# Patient Record
Sex: Male | Born: 1970 | Race: Black or African American | Hispanic: No | Marital: Married | Smoking: Never smoker
Health system: Southern US, Community
[De-identification: ages and names within clinical notes are randomized; demographics above are authoritative.]

## PROBLEM LIST (undated history)

## (undated) ENCOUNTER — Ambulatory Visit

## (undated) DIAGNOSIS — G360 Neuromyelitis optica [Devic]: Secondary | ICD-10-CM

## (undated) DIAGNOSIS — E119 Type 2 diabetes mellitus without complications: Secondary | ICD-10-CM

## (undated) DIAGNOSIS — F419 Anxiety disorder, unspecified: Secondary | ICD-10-CM

## (undated) DIAGNOSIS — Z87442 Personal history of urinary calculi: Secondary | ICD-10-CM

## (undated) HISTORY — DX: Anxiety disorder, unspecified: F41.9

---
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
## 2011-03-28 DIAGNOSIS — N319 Neuromuscular dysfunction of bladder, unspecified: Secondary | ICD-10-CM | POA: Insufficient documentation

## 2012-01-29 DIAGNOSIS — G8220 Paraplegia, unspecified: Secondary | ICD-10-CM

## 2012-01-29 HISTORY — DX: Paraplegia, unspecified: G82.20

## 2012-08-25 DIAGNOSIS — N3644 Muscular disorders of urethra: Secondary | ICD-10-CM | POA: Insufficient documentation

## 2013-05-20 DIAGNOSIS — G360 Neuromyelitis optica [Devic]: Secondary | ICD-10-CM | POA: Insufficient documentation

## 2013-05-20 DIAGNOSIS — M4716 Other spondylosis with myelopathy, lumbar region: Secondary | ICD-10-CM | POA: Insufficient documentation

## 2013-06-01 DIAGNOSIS — N529 Male erectile dysfunction, unspecified: Secondary | ICD-10-CM | POA: Insufficient documentation

## 2015-08-28 DIAGNOSIS — R600 Localized edema: Secondary | ICD-10-CM | POA: Insufficient documentation

## 2016-01-16 DIAGNOSIS — N179 Acute kidney failure, unspecified: Secondary | ICD-10-CM | POA: Insufficient documentation

## 2016-01-24 NOTE — Telephone Encounter (Signed)
follow up 2 weeks general urology   Received: 5 days ago   Message Contents   [name], Sharlette Dense, PA  Steve Robbins      ??      Albina Billet,   This patient needs follow up with general urology in 2 weeks for urosepsis right ureteral stone s/p right JJ stent 01/16/2016.     Thank you,   Heather      Please schedule follow up appointments

## 2016-02-02 ENCOUNTER — Encounter: Attending: Urology | Primary: Neurology

## 2018-01-14 ENCOUNTER — Other Ambulatory Visit: Payer: Self-pay

## 2018-01-14 ENCOUNTER — Inpatient Hospital Stay (HOSPITAL_COMMUNITY)
Admission: EM | Admit: 2018-01-14 | Discharge: 2018-01-18 | DRG: 661 | Disposition: A | Discharge status: Home / Self Care | Payer: Medicare Other | Attending: Urology | Admitting: Urology

## 2018-01-14 ENCOUNTER — Encounter (HOSPITAL_COMMUNITY): Payer: Self-pay | Admitting: Emergency Medicine

## 2018-01-14 DIAGNOSIS — B952 Enterococcus as the cause of diseases classified elsewhere: Secondary | ICD-10-CM | POA: Diagnosis present

## 2018-01-14 DIAGNOSIS — N390 Urinary tract infection, site not specified: Secondary | ICD-10-CM

## 2018-01-14 DIAGNOSIS — Z885 Allergy status to narcotic agent status: Secondary | ICD-10-CM

## 2018-01-14 DIAGNOSIS — E119 Type 2 diabetes mellitus without complications: Secondary | ICD-10-CM | POA: Diagnosis present

## 2018-01-14 DIAGNOSIS — N136 Pyonephrosis: Secondary | ICD-10-CM | POA: Diagnosis not present

## 2018-01-14 DIAGNOSIS — D899 Disorder involving the immune mechanism, unspecified: Secondary | ICD-10-CM

## 2018-01-14 DIAGNOSIS — N201 Calculus of ureter: Secondary | ICD-10-CM

## 2018-01-14 DIAGNOSIS — Z7984 Long term (current) use of oral hypoglycemic drugs: Secondary | ICD-10-CM

## 2018-01-14 DIAGNOSIS — Q6211 Congenital occlusion of ureteropelvic junction: Secondary | ICD-10-CM

## 2018-01-14 DIAGNOSIS — D849 Immunodeficiency, unspecified: Secondary | ICD-10-CM

## 2018-01-14 HISTORY — DX: Type 2 diabetes mellitus without complications: E11.9

## 2018-01-14 HISTORY — DX: Neuromyelitis optica (devic): G36.0

## 2018-01-14 NOTE — ED Triage Notes (Signed)
Patient states he had a high fever today and last night. Patient states he was shivering last night and tired.

## 2018-01-15 ENCOUNTER — Inpatient Hospital Stay (HOSPITAL_COMMUNITY): Payer: Medicare Other

## 2018-01-15 ENCOUNTER — Inpatient Hospital Stay (HOSPITAL_COMMUNITY): Payer: Medicare Other | Admitting: Anesthesiology

## 2018-01-15 ENCOUNTER — Emergency Department (HOSPITAL_COMMUNITY): Payer: Medicare Other

## 2018-01-15 ENCOUNTER — Encounter (HOSPITAL_COMMUNITY)
Admission: EM | Disposition: A | Discharge status: Home / Self Care | Payer: Self-pay | Source: Home / Self Care | Attending: Urology

## 2018-01-15 ENCOUNTER — Encounter (HOSPITAL_COMMUNITY): Payer: Self-pay | Admitting: Emergency Medicine

## 2018-01-15 DIAGNOSIS — E119 Type 2 diabetes mellitus without complications: Secondary | ICD-10-CM | POA: Diagnosis present

## 2018-01-15 DIAGNOSIS — Z885 Allergy status to narcotic agent status: Secondary | ICD-10-CM | POA: Diagnosis not present

## 2018-01-15 DIAGNOSIS — Z7984 Long term (current) use of oral hypoglycemic drugs: Secondary | ICD-10-CM | POA: Diagnosis not present

## 2018-01-15 DIAGNOSIS — N136 Pyonephrosis: Secondary | ICD-10-CM | POA: Diagnosis present

## 2018-01-15 DIAGNOSIS — B952 Enterococcus as the cause of diseases classified elsewhere: Secondary | ICD-10-CM | POA: Diagnosis present

## 2018-01-15 DIAGNOSIS — N201 Calculus of ureter: Secondary | ICD-10-CM | POA: Diagnosis present

## 2018-01-15 HISTORY — PX: CYSTOSCOPY WITH STENT PLACEMENT: SHX5790

## 2018-01-15 LAB — CBC WITH DIFFERENTIAL/PLATELET
Abs Immature Granulocytes: 0.03 10*3/uL (ref 0.00–0.07)
Basophils Absolute: 0 10*3/uL (ref 0.0–0.1)
Basophils Relative: 0 %
Eosinophils Absolute: 0.1 10*3/uL (ref 0.0–0.5)
Eosinophils Relative: 1 %
HCT: 41 % (ref 39.0–52.0)
Hemoglobin: 12.5 g/dL — ABNORMAL LOW (ref 13.0–17.0)
Immature Granulocytes: 1 %
Lymphocytes Relative: 19 %
Lymphs Abs: 1.3 10*3/uL (ref 0.7–4.0)
MCH: 25.1 pg — ABNORMAL LOW (ref 26.0–34.0)
MCHC: 30.5 g/dL (ref 30.0–36.0)
MCV: 82.2 fL (ref 80.0–100.0)
Monocytes Absolute: 1 10*3/uL (ref 0.1–1.0)
Monocytes Relative: 16 %
Neutro Abs: 4.2 10*3/uL (ref 1.7–7.7)
Neutrophils Relative %: 63 %
Platelets: 225 10*3/uL (ref 150–400)
RBC: 4.99 MIL/uL (ref 4.22–5.81)
RDW: 14.1 % (ref 11.5–15.5)
WBC: 6.6 10*3/uL (ref 4.0–10.5)
nRBC: 0 % (ref 0.0–0.2)

## 2018-01-15 LAB — URINALYSIS, ROUTINE W REFLEX MICROSCOPIC
Bilirubin Urine: NEGATIVE
Glucose, UA: NEGATIVE mg/dL
Ketones, ur: NEGATIVE mg/dL
Nitrite: NEGATIVE
PROTEIN: NEGATIVE mg/dL
Specific Gravity, Urine: 1.009 (ref 1.005–1.030)
pH: 5 (ref 5.0–8.0)

## 2018-01-15 LAB — GLUCOSE, CAPILLARY
Glucose-Capillary: 101 mg/dL — ABNORMAL HIGH (ref 70–99)
Glucose-Capillary: 102 mg/dL — ABNORMAL HIGH (ref 70–99)
Glucose-Capillary: 130 mg/dL — ABNORMAL HIGH (ref 70–99)

## 2018-01-15 LAB — CBC
HCT: 38.3 % — ABNORMAL LOW (ref 39.0–52.0)
Hemoglobin: 11.6 g/dL — ABNORMAL LOW (ref 13.0–17.0)
MCH: 24.9 pg — ABNORMAL LOW (ref 26.0–34.0)
MCHC: 30.3 g/dL (ref 30.0–36.0)
MCV: 82.4 fL (ref 80.0–100.0)
Platelets: 195 10*3/uL (ref 150–400)
RBC: 4.65 MIL/uL (ref 4.22–5.81)
RDW: 14.1 % (ref 11.5–15.5)
WBC: 6.7 10*3/uL (ref 4.0–10.5)
nRBC: 0 % (ref 0.0–0.2)

## 2018-01-15 LAB — COMPREHENSIVE METABOLIC PANEL
ALT: 13 U/L (ref 0–44)
ANION GAP: 10 (ref 5–15)
AST: 13 U/L — ABNORMAL LOW (ref 15–41)
Albumin: 4.3 g/dL (ref 3.5–5.0)
Alkaline Phosphatase: 37 U/L — ABNORMAL LOW (ref 38–126)
BUN: 13 mg/dL (ref 6–20)
CO2: 24 mmol/L (ref 22–32)
Calcium: 9.1 mg/dL (ref 8.9–10.3)
Chloride: 101 mmol/L (ref 98–111)
Creatinine, Ser: 1.17 mg/dL (ref 0.61–1.24)
GFR calc Af Amer: >60 mL/min (ref >60)
GFR calc non Af Amer: >60 mL/min (ref >60)
Glucose, Bld: 117 mg/dL — ABNORMAL HIGH (ref 70–99)
Potassium: 3.6 mmol/L (ref 3.5–5.1)
Sodium: 135 mmol/L (ref 135–145)
TOTAL PROTEIN: 7.5 g/dL (ref 6.5–8.1)
Total Bilirubin: 1.3 mg/dL — ABNORMAL HIGH (ref 0.3–1.2)

## 2018-01-15 LAB — CBG MONITORING, ED: Glucose-Capillary: 100 mg/dL — ABNORMAL HIGH (ref 70–99)

## 2018-01-15 LAB — CREATININE, SERUM
CREATININE: 1.09 mg/dL (ref 0.61–1.24)
GFR calc Af Amer: >60 mL/min (ref >60)
GFR calc non Af Amer: >60 mL/min (ref >60)

## 2018-01-15 LAB — HIV ANTIBODY (ROUTINE TESTING W REFLEX): HIV SCREEN 4TH GENERATION: NONREACTIVE

## 2018-01-15 LAB — INFLUENZA PANEL BY PCR (TYPE A & B)
INFLBPCR: NEGATIVE
Influenza A By PCR: NEGATIVE

## 2018-01-15 SURGERY — CYSTOSCOPY, WITH STENT INSERTION
Anesthesia: General | Site: Ureter | Laterality: Left

## 2018-01-15 MED ORDER — SODIUM CHLORIDE 0.9 % IV SOLN
1.0000 g | INTRAVENOUS | Status: DC
  Administered 2018-01-16 – 2018-01-17 (×2): 1 g via INTRAVENOUS
  Filled 2018-01-15 (×2): qty 1

## 2018-01-15 MED ORDER — INSULIN ASPART 100 UNIT/ML ~~LOC~~ SOLN
4.0000 [IU] | Freq: Three times a day (TID) | SUBCUTANEOUS | Status: DC
  Administered 2018-01-16 – 2018-01-18 (×5): 4 [IU] via SUBCUTANEOUS

## 2018-01-15 MED ORDER — ONDANSETRON HCL 4 MG/2ML IJ SOLN
INTRAMUSCULAR | Status: DC | PRN
  Administered 2018-01-15: 4 mg via INTRAVENOUS

## 2018-01-15 MED ORDER — LIDOCAINE 2% (20 MG/ML) 5 ML SYRINGE
INTRAMUSCULAR | Status: DC | PRN
  Administered 2018-01-15: 100 mg via INTRAVENOUS

## 2018-01-15 MED ORDER — INSULIN ASPART 100 UNIT/ML ~~LOC~~ SOLN
0.0000 [IU] | Freq: Three times a day (TID) | SUBCUTANEOUS | Status: DC
  Administered 2018-01-15 – 2018-01-17 (×5): 2 [IU] via SUBCUTANEOUS

## 2018-01-15 MED ORDER — ROCURONIUM BROMIDE 10 MG/ML (PF) SYRINGE
PREFILLED_SYRINGE | INTRAVENOUS | Status: DC | PRN
  Administered 2018-01-15: 40 mg via INTRAVENOUS

## 2018-01-15 MED ORDER — SODIUM CHLORIDE 0.9 % IV SOLN
1.0000 g | Freq: Once | INTRAVENOUS | Status: AC
  Administered 2018-01-15: 1 g via INTRAVENOUS
  Filled 2018-01-15: qty 1

## 2018-01-15 MED ORDER — PROPOFOL 10 MG/ML IV BOLUS
INTRAVENOUS | Status: AC
  Filled 2018-01-15: qty 20

## 2018-01-15 MED ORDER — FENTANYL CITRATE (PF) 100 MCG/2ML IJ SOLN
INTRAMUSCULAR | Status: AC
  Filled 2018-01-15: qty 2

## 2018-01-15 MED ORDER — ONDANSETRON HCL 4 MG/2ML IJ SOLN
4.0000 mg | INTRAMUSCULAR | Status: DC | PRN
  Administered 2018-01-15 – 2018-01-17 (×2): 4 mg via INTRAVENOUS
  Filled 2018-01-15 (×2): qty 2

## 2018-01-15 MED ORDER — FENTANYL CITRATE (PF) 100 MCG/2ML IJ SOLN: 25.0000 ug | INTRAMUSCULAR | Status: DC | PRN

## 2018-01-15 MED ORDER — IBUPROFEN 200 MG PO TABS
200.0000 mg | ORAL_TABLET | Freq: Four times a day (QID) | ORAL | Status: AC | PRN
  Administered 2018-01-15 – 2018-01-16 (×2): 200 mg via ORAL
  Filled 2018-01-15 (×2): qty 1

## 2018-01-15 MED ORDER — SODIUM CHLORIDE 0.45 % IV SOLN
INTRAVENOUS | Status: DC
  Administered 2018-01-15 – 2018-01-16 (×2): via INTRAVENOUS

## 2018-01-15 MED ORDER — SUGAMMADEX SODIUM 200 MG/2ML IV SOLN
INTRAVENOUS | Status: DC | PRN
  Administered 2018-01-15: 500 mg via INTRAVENOUS

## 2018-01-15 MED ORDER — SODIUM CHLORIDE 0.9 % IV BOLUS
1000.0000 mL | Freq: Once | INTRAVENOUS | Status: AC
  Administered 2018-01-15: 1000 mL via INTRAVENOUS

## 2018-01-15 MED ORDER — OXYBUTYNIN CHLORIDE 5 MG PO TABS
5.0000 mg | ORAL_TABLET | Freq: Three times a day (TID) | ORAL | Status: DC | PRN
  Filled 2018-01-15: qty 1

## 2018-01-15 MED ORDER — ROCURONIUM BROMIDE 10 MG/ML (PF) SYRINGE
PREFILLED_SYRINGE | INTRAVENOUS | Status: AC
  Filled 2018-01-15: qty 10

## 2018-01-15 MED ORDER — SUGAMMADEX SODIUM 500 MG/5ML IV SOLN
INTRAVENOUS | Status: AC
  Filled 2018-01-15: qty 5

## 2018-01-15 MED ORDER — ENOXAPARIN SODIUM 40 MG/0.4ML ~~LOC~~ SOLN
40.0000 mg | Freq: Every day | SUBCUTANEOUS | Status: DC
  Administered 2018-01-16 – 2018-01-18 (×3): 40 mg via SUBCUTANEOUS
  Filled 2018-01-15 (×4): qty 0.4

## 2018-01-15 MED ORDER — PROMETHAZINE HCL 25 MG/ML IJ SOLN: 6.2500 mg | INTRAMUSCULAR | Status: DC | PRN

## 2018-01-15 MED ORDER — MYCOPHENOLATE MOFETIL 250 MG PO CAPS
1000.0000 mg | ORAL_CAPSULE | Freq: Two times a day (BID) | ORAL | Status: DC
  Administered 2018-01-15 – 2018-01-18 (×7): 1000 mg via ORAL
  Filled 2018-01-15 (×8): qty 4

## 2018-01-15 MED ORDER — ONDANSETRON HCL 4 MG/2ML IJ SOLN
INTRAMUSCULAR | Status: AC
  Filled 2018-01-15: qty 2

## 2018-01-15 MED ORDER — MIDAZOLAM HCL 2 MG/2ML IJ SOLN
INTRAMUSCULAR | Status: DC | PRN
  Administered 2018-01-15: 2 mg via INTRAVENOUS

## 2018-01-15 MED ORDER — DIPHENHYDRAMINE HCL 50 MG/ML IJ SOLN
INTRAMUSCULAR | Status: DC | PRN
  Administered 2018-01-15: 12.5 mg via INTRAVENOUS

## 2018-01-15 MED ORDER — FENTANYL CITRATE (PF) 100 MCG/2ML IJ SOLN
INTRAMUSCULAR | Status: DC | PRN
  Administered 2018-01-15: 100 ug via INTRAVENOUS

## 2018-01-15 MED ORDER — ZOLPIDEM TARTRATE 5 MG PO TABS
5.0000 mg | ORAL_TABLET | Freq: Every evening | ORAL | Status: DC | PRN
  Filled 2018-01-15: qty 1

## 2018-01-15 MED ORDER — LACTATED RINGERS IV SOLN
INTRAVENOUS | Status: DC | PRN
  Administered 2018-01-15: 06:00:00 via INTRAVENOUS

## 2018-01-15 MED ORDER — SODIUM CHLORIDE 0.9 % IV SOLN
INTRAVENOUS | Status: AC
  Filled 2018-01-15: qty 20

## 2018-01-15 MED ORDER — STERILE WATER FOR IRRIGATION IR SOLN
Status: DC | PRN
  Administered 2018-01-15: 3000 mL

## 2018-01-15 MED ORDER — DEXTROSE 5 % IV SOLN
INTRAVENOUS | Status: DC | PRN
  Administered 2018-01-15: 2 g via INTRAVENOUS

## 2018-01-15 MED ORDER — ACETAMINOPHEN 325 MG PO TABS
650.0000 mg | ORAL_TABLET | ORAL | Status: DC | PRN
  Administered 2018-01-15 – 2018-01-18 (×8): 650 mg via ORAL
  Filled 2018-01-15 (×8): qty 2

## 2018-01-15 MED ORDER — LIDOCAINE 2% (20 MG/ML) 5 ML SYRINGE
INTRAMUSCULAR | Status: AC
  Filled 2018-01-15: qty 5

## 2018-01-15 MED ORDER — MIDAZOLAM HCL 2 MG/2ML IJ SOLN
INTRAMUSCULAR | Status: AC
  Filled 2018-01-15: qty 2

## 2018-01-15 MED ORDER — SUCCINYLCHOLINE CHLORIDE 200 MG/10ML IV SOSY
PREFILLED_SYRINGE | INTRAVENOUS | Status: AC
  Filled 2018-01-15: qty 10

## 2018-01-15 MED ORDER — SENNA 8.6 MG PO TABS
1.0000 | ORAL_TABLET | Freq: Two times a day (BID) | ORAL | Status: DC
  Administered 2018-01-15 – 2018-01-18 (×3): 8.6 mg via ORAL
  Filled 2018-01-15 (×7): qty 1

## 2018-01-15 MED ORDER — DIPHENHYDRAMINE HCL 50 MG/ML IJ SOLN
INTRAMUSCULAR | Status: AC
  Filled 2018-01-15: qty 1

## 2018-01-15 MED ORDER — PROPOFOL 10 MG/ML IV BOLUS
INTRAVENOUS | Status: DC | PRN
  Administered 2018-01-15: 200 mg via INTRAVENOUS

## 2018-01-15 SURGICAL SUPPLY — 15 items
BAG URO CATCHER STRL LF (MISCELLANEOUS) ×3 IMPLANT
CATH INTERMIT  6FR 70CM (CATHETERS) IMPLANT
CLOTH BEACON ORANGE TIMEOUT ST (SAFETY) ×3 IMPLANT
COVER WAND RF STERILE (DRAPES) IMPLANT
GLOVE BIOGEL M 8.0 STRL (GLOVE) ×3 IMPLANT
GOWN STRL REUS W/ TWL XL LVL3 (GOWN DISPOSABLE) ×1 IMPLANT
GOWN STRL REUS W/TWL LRG LVL3 (GOWN DISPOSABLE) ×3 IMPLANT
GOWN STRL REUS W/TWL XL LVL3 (GOWN DISPOSABLE) ×2
GUIDEWIRE ANG ZIPWIRE 038X150 (WIRE) IMPLANT
GUIDEWIRE STR DUAL SENSOR (WIRE) ×3 IMPLANT
MANIFOLD NEPTUNE II (INSTRUMENTS) ×3 IMPLANT
PACK CYSTO (CUSTOM PROCEDURE TRAY) ×3 IMPLANT
STENT URET 6FRX26 CONTOUR (STENTS) ×3 IMPLANT
TUBING CONNECTING 10 (TUBING) ×2 IMPLANT
TUBING CONNECTING 10' (TUBING) ×1

## 2018-01-15 NOTE — ED Provider Notes (Signed)
[facility] DEPT Provider Note   CSN: [id number] Arrival date & time: 01/14/18  2130  Time seen 12:06 AM   History   Chief Complaint Chief Complaint  Patient presents with  . Fatigue  . Fever    HPI Victor Christensen is a 47 y.o. male.    HPI patient reports the evening of December 17 he had chills at night.  On the day of December 18 he felt weak and tired without energy all day.  He states about 6:30 PM tonight he had a fever of 102.4 but did not have chills.  He denies cough, sore throat, he states he had some mild rhinorrhea that is white, he denies sneezing, nausea, vomiting, or diarrhea.  He denies being around anybody else who is ill.  He denies dysuria but states he started having frequency yesterday without hematuria.  He states he ate less today although he did drink.  Patient states he has felt this way before when he had kidney stones or urinary tract infections.  He states he has had kidney stones 3 times the first 2 he had pain but the last episode 2 years ago he did not have any pain.  He did have cystoscopy done to remove the stones.  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  He is in a wheelchair.  He states he takes CellCept.  PCP  OOT   Past Medical History:  Diagnosis Date  . Diabetes mellitus without complication ([place])   . Neuromyelitis optica [facility])     Patient Active Problem List   Diagnosis Date Noted  . Ureteral calculus, left 01/15/2018    History reviewed. No pertinent surgical history.      Home Medications    Prior to Admission medications   Medication Sig Start Date End Date Taking? Authorizing Provider  Lancets [facility] ULTRASOFT) lancets 4 each by Other route 4 (four) times daily -  with meals and at bedtime. 09/16/15  Yes [provider]  metFORMIN (GLUCOPHAGE-XR) 500 MG 24 hr tablet Take 500 mg by mouth 2 (two) times daily.   Yes [provider]  mycophenolate (CELLCEPT) 500 MG tablet Take 1,000 mg by mouth 2 (two) times daily.   Yes [provider]  Gabapentin prn  Family History History reviewed. No pertinent family history.  Social History Social History   Tobacco Use  . Smoking status: Never Smoker  . Smokeless tobacco: Never Used  Substance Use Topics  . Alcohol use: Not Currently  . Drug use: Not Currently  wheelchair bound Patient is moving to [place]  Allergies   Oxycodone-acetaminophen   Review of Systems Review of Systems  All other systems reviewed and are negative.    Physical Exam Updated Vital Signs BP 95/68 (BP Location: Left Arm)   Pulse 97   Temp 98.1 F (36.7 C) (Oral)   Resp 15   Ht 5' 11"  (1.803 m)   Wt 106.6 kg   SpO2 96%   BMI 32.78 kg/m   Vital signs normal except for mild hypotension   Physical Exam Vitals signs and nursing note reviewed.  Constitutional:      General: He is not in acute distress.    Appearance: Normal appearance. He is well-developed. He is not ill-appearing or toxic-appearing.  HENT:     Head: Normocephalic and atraumatic.     Right Ear: External ear normal.     Left Ear: External  ear normal.     Nose: Nose normal. No mucosal edema or rhinorrhea.     Mouth/Throat:     Mouth: Mucous membranes are dry.     Dentition: No dental abscesses.     Pharynx: No uvula swelling.  Eyes:     Conjunctiva/sclera: Conjunctivae normal.     Pupils: Pupils are equal, round, and reactive to light.  Neck:     Musculoskeletal: Full passive range of motion without pain, normal range of motion and neck supple.  Cardiovascular:     Rate and Rhythm: Regular rhythm. Tachycardia present.     Heart sounds: Normal heart sounds. No murmur. No friction rub. No gallop.   Pulmonary:     Effort: Pulmonary effort is normal. No respiratory distress.     Breath sounds: Normal breath sounds. No wheezing, rhonchi or rales.  Chest:     Chest wall: No tenderness or  crepitus.  Abdominal:     General: Bowel sounds are normal. There is no distension.     Palpations: Abdomen is soft.     Tenderness: There is no abdominal tenderness. There is no guarding or rebound.  Musculoskeletal:     Comments: Moves upper extremities well.   Skin:    General: Skin is warm and dry.     Coloration: Skin is not pale.     Findings: No erythema or rash.  Neurological:     Mental Status: He is alert and oriented to person, place, and time.     Cranial Nerves: No cranial nerve deficit.  Psychiatric:        Mood and Affect: Mood normal. Mood is not anxious.        Speech: Speech normal.        Behavior: Behavior normal.        Thought Content: Thought content normal.        Judgment: Judgment normal.      ED Treatments / Results  Labs (all labs ordered are listed, but only abnormal results are displayed) Results for orders placed or performed during the hospital encounter of 01/14/18  Culture, blood (routine x 2)  Result Value Ref Range   Specimen Description      BLOOD RIGHT FOREARM Performed at [facility], [address], [place], [place] [address]    Special Requests      BOTTLES DRAWN AEROBIC AND ANAEROBIC Blood Culture results may not be optimal due to an excessive volume of blood received in culture bottles Performed at [facility] [address] [place], [place] [address]    Culture PENDING    Report Status PENDING   Comprehensive metabolic panel  Result Value Ref Range   Sodium 135 135 - 145 mmol/L   Potassium 3.6 3.5 - 5.1 mmol/L   Chloride 101 98 - 111 mmol/L   CO2 24 22 - 32 mmol/L   Glucose, Bld 117 (H) 70 - 99 mg/dL   BUN 13 6 - 20 mg/dL   Creatinine, Ser 1.17 0.61 - 1.24 mg/dL   Calcium 9.1 8.9 - 10.3 mg/dL   Total Protein 7.5 6.5 - 8.1 g/dL   Albumin 4.3 3.5 - 5.0 g/dL   AST 13 (L) 15 - 41 U/L   ALT 13 0 - 44 U/L   Alkaline Phosphatase 37 (L) 38 - 126 U/L   Total Bilirubin 1.3 (H) 0.3 - 1.2 mg/dL   GFR  calc non Af Amer >60 >60 mL/min   GFR calc Af  Amer >60 >60 mL/min   Anion gap 10 5 - 15  CBC with Differential  Result Value Ref Range   WBC 6.6 4.0 - 10.5 K/uL   RBC 4.99 4.22 - 5.81 MIL/uL   Hemoglobin 12.5 (L) 13.0 - 17.0 g/dL   HCT 41.0 39.0 - 52.0 %   MCV 82.2 80.0 - 100.0 fL   MCH 25.1 (L) 26.0 - 34.0 pg   MCHC 30.5 30.0 - 36.0 g/dL   RDW 14.1 11.5 - 15.5 %   Platelets 225 150 - 400 K/uL   nRBC 0.0 0.0 - 0.2 %   Neutrophils Relative % 63 %   Neutro Abs 4.2 1.7 - 7.7 K/uL   Lymphocytes Relative 19 %   Lymphs Abs 1.3 0.7 - 4.0 K/uL   Monocytes Relative 16 %   Monocytes Absolute 1.0 0.1 - 1.0 K/uL   Eosinophils Relative 1 %   Eosinophils Absolute 0.1 0.0 - 0.5 K/uL   Basophils Relative 0 %   Basophils Absolute 0.0 0.0 - 0.1 K/uL   Immature Granulocytes 1 %   Abs Immature Granulocytes 0.03 0.00 - 0.07 K/uL  Urinalysis, Routine w reflex microscopic  Result Value Ref Range   Color, Urine YELLOW YELLOW   APPearance CLEAR CLEAR   Specific Gravity, Urine 1.009 1.005 - 1.030   pH 5.0 5.0 - 8.0   Glucose, UA NEGATIVE NEGATIVE mg/dL   Hgb urine dipstick SMALL (A) NEGATIVE   Bilirubin Urine NEGATIVE NEGATIVE   Ketones, ur NEGATIVE NEGATIVE mg/dL   Protein, ur NEGATIVE NEGATIVE mg/dL   Nitrite NEGATIVE NEGATIVE   Leukocytes, UA SMALL (A) NEGATIVE   RBC / HPF 0-5 0 - 5 RBC/hpf   WBC, UA 21-50 0 - 5 WBC/hpf   Bacteria, UA RARE (A) NONE SEEN   Squamous Epithelial / LPF 0-5 0 - 5   Mucus PRESENT   Influenza panel by PCR (type A & B)  Result Value Ref Range   Influenza A By PCR NEGATIVE NEGATIVE   Influenza B By PCR NEGATIVE NEGATIVE  CBC  Result Value Ref Range   WBC 6.7 4.0 - 10.5 K/uL   RBC 4.65 4.22 - 5.81 MIL/uL   Hemoglobin 11.6 (L) 13.0 - 17.0 g/dL   HCT 38.3 (L) 39.0 - 52.0 %   MCV 82.4 80.0 - 100.0 fL   MCH 24.9 (L) 26.0 - 34.0 pg   MCHC 30.3 30.0 - 36.0 g/dL   RDW 14.1 11.5 - 15.5 %   Platelets 195 150 - 400 K/uL   nRBC 0.0 0.0 - 0.2 %  Creatinine, serum    Result Value Ref Range   Creatinine, Ser 1.09 0.61 - 1.24 mg/dL   GFR calc non Af Amer >60 >60 mL/min   GFR calc Af Amer >60 >60 mL/min  Glucose, capillary  Result Value Ref Range   Glucose-Capillary 101 (H) 70 - 99 mg/dL  CBG monitoring, ED  Result Value Ref Range   Glucose-Capillary 100 (H) 70 - 99 mg/dL   Comment 1 Notify RN    Laboratory interpretation all normal except probable UTI urine culture pending    EKG None  Radiology Ct Renal Stone Study  Result Date: 01/15/2018 CLINICAL DATA:  Flank pain and fever. Laterality not indicated. EXAM: CT ABDOMEN AND PELVIS WITHOUT CONTRAST TECHNIQUE: Multidetector CT imaging of the abdomen and pelvis was performed following the standard protocol without IV contrast. COMPARISON:  None. FINDINGS: Lower chest: Patchy peribronchial tree-in-bud nodular infiltrates in the lung bases probably  representing bronchopneumonia. No pleural effusions. Hepatobiliary: Layering density in the gallbladder may represent small stones or sludge. No obvious wall thickening or infiltration. No bile duct dilatation. No focal liver lesions demonstrated on unenhanced imaging. Pancreas: Unremarkable. No pancreatic ductal dilatation or surrounding inflammatory changes. Spleen: Normal in size without focal abnormality. Adrenals/Urinary Tract: No adrenal gland nodules. Bilateral intrarenal stones, largest in the right lower pole measuring 8 mm diameter. 6 mm stone in the proximal left ureter at the ureteropelvic junction with mild proximal hydronephrosis and stranding around the left kidney. Distal ureter is decompressed. Bladder is unremarkable. Stomach/Bowel: Stomach, small bowel, and colon are not abnormally distended. Stool fills the colon. No wall thickening or inflammatory changes appreciated. Appendix is normal. Vascular/Lymphatic: No significant vascular findings are present. Scattered retroperitoneal lymph nodes are not pathologically enlarged, likely reactive.  Reproductive: Prostate is unremarkable. Other: No free air or free fluid in the abdomen. Abdominal wall musculature appears intact. Musculoskeletal: Degenerative changes in the right hip. No destructive bone lesions. IMPRESSION: 1. 6 mm stone in the proximal left ureter with moderate proximal obstruction. 2. Bilateral nonobstructing intrarenal stones. 3. Patchy peribronchial tree-in-bud nodular infiltrates in the lung bases probably representing bronchopneumonia. 4. Layering density in the gallbladder may represent small stones or sludge. Electronically Signed   By: Lucienne Capers M.D.   On: 01/15/2018 01:09    Procedures Procedures (including critical care time)  Medications Ordered in ED Medications  sodium chloride 0.9 % bolus 1,000 mL (0 mLs Intravenous Stopped 01/15/18 0229)  sodium chloride 0.9 % bolus 1,000 mL (0 mLs Intravenous Stopped 01/15/18 0229)  aztreonam (AZACTAM) 1 g in sodium chloride 0.9 % 100 mL IVPB (0 g Intravenous Stopped 01/15/18 0300)     Initial Impression / Assessment and Plan / ED Course  I have reviewed the triage vital signs and the nursing notes.  Pertinent labs & imaging results that were available during my care of the patient were reviewed by me and considered in my medical decision making (see chart for details).     Patient is immune compromised because of his CellCept, he presents with a high fever and general malaise.  He has felt similarly in the past with UTIs.  IV was ordered with IV fluids, CT renal was done to look for kidney stones since he states the last time was 2 years ago and he was pain-free.  Laboratory testing was done including blood cultures.  Patient's temperature was rechecked at 12:22 AM and he remains afebrile.  02:50 AM there were no old urine cultures in his chart.  Patient was felt to be high risk UTI and he was given Azactam 1 g IV.  I talked to the patient about admission and he is agreeable.  Portable chest x-ray was done to  evaluate him for pneumonia.  3:27 AM Dr. Fuller Plan, hospitalist wants urology to be called for his admission.  03:35 AM Dr  Eulogio Ditch, Urology, will come see patient.   Final Clinical Impressions(s) / ED Diagnoses   Final diagnoses:  Left ureteral stone  Hydronephrosis with ureteropelvic junction (UPJ) obstruction  Immunocompromised patient [facility])  Urinary tract infection without hematuria, site unspecified    Plan admission  Rolland Porter, MD, Barbette Or, MD 01/15/18 [phone]

## 2018-01-15 NOTE — Op Note (Signed)
Preoperative diagnosis: Infected, obstructing left upper ureteral stone  Postoperative diagnosis: Same  Principal procedure: Cystoscopy, left retrograde ureteropyelogram, fluoroscopic interpretation, placement of 6 French by 26 cm contour double-J stent without tether  Surgeon: Loxley Cibrian  Anesthesia: General  Complications: None  Specimen: Urine from left renal pelvis for culture  Estimated blood loss: None  Indications: 47 year old male with neurologic illness, prior history of urolithiasis presenting earlier this morning with infected, obstructing left ureteral stone.  The patient does not have signs of sepsis.  He has had adequate evaluation of presents at this time for urgent stenting for management of this obstructing, infected left ureteral stone.  I discussed the procedure with the patient and his fiance.  He has been through a procedure like this in 2018.  He understands the procedure, risks, complications and desires to proceed.  Description of procedure: The patient was properly marked and identified prior to entering the operating room.  He was placed on the table, underwent general anesthetic and was placed in the dorsolithotomy position.  Genitalia and perineum were prepped and draped.  Proper timeout was performed.  74 French panendoscope was advanced under direct vision through his urethra.  Prostate was nonobstructive with a bladder neck was high riding.  The bladder was inspected circumferentially and found to be normal.  Left ureteral orifice was cannulated with a 6 [place] open-ended catheter.  Retrograde ureteropyelogram was performed using Omnipaque with findings of a capacious left mid ureter, filling defect in the proximal ureter consistent with stone in proximal pyelocaliectasis.  Images were saved.  I then passed the open-ended catheter into the left renal pelvis, easily traversing the stone.  Hydronephrotic drip resulted in this urine was sent for culture after being  collected.  I then negotiated the sensor tip guidewire through the open-ended catheter with a curl seen within the renal pelvis.  Open-ended catheter was removed.  26 cm x 6 [place] contour double-J stent deployed in the left ureter over top of the guidewire, with excellent proximal and distal curl seen using fluoroscopy and cystoscopy.  The bladder was drained.  The scope was removed.  The procedure was terminated.  The patient was awakened and taken to the PACU.  He tolerated the procedure well.  He will be admitted for IV antibiotic management.

## 2018-01-15 NOTE — ED Notes (Addendum)
*  PT HAS TWO BLACK SNEAKERS, GREY SWEATPANTS, AND BLACK GYM SHORTS THAT NEED TO BE LOCKED UP IN PACU*   Items are in labeled pt belonging bag

## 2018-01-15 NOTE — ED Notes (Signed)
Pt taken to short stay per request.

## 2018-01-15 NOTE — ED Notes (Signed)
Spoke with [place] from [place]. Pt will be going to surgery around 6am.

## 2018-01-15 NOTE — Progress Notes (Signed)
Patient elevated temp was not corrected with tylenol, Probation officer notified provider with set of vitals and requested order for motrin, writer received order and gave medication to patient, will continue to monitor and follow up.

## 2018-01-15 NOTE — Anesthesia Procedure Notes (Signed)
Procedure Name: Intubation Date/Time: 01/15/2018 6:27 AM Performed by: Sharlette Dense, CRNA Patient Re-evaluated:Patient Re-evaluated prior to induction Oxygen Delivery Method: Circle system utilized Preoxygenation: Pre-oxygenation with 100% oxygen Induction Type: IV induction Ventilation: Mask ventilation without difficulty and Oral airway inserted - appropriate to patient size Laryngoscope Size: Miller and 3 Grade View: Grade I Tube type: Oral Tube size: 8.0 mm Number of attempts: 1 Airway Equipment and Method: Stylet Placement Confirmation: ETT inserted through vocal cords under direct vision,  positive ETCO2 and breath sounds checked- equal and bilateral Secured at: 21 cm Tube secured with: Tape Dental Injury: Teeth and Oropharynx as per pre-operative assessment

## 2018-01-15 NOTE — Anesthesia Preprocedure Evaluation (Addendum)
Anesthesia Evaluation    Reviewed: Allergy & Precautions, Patient's Chart, lab work & pertinent test results  Airway Mallampati: II  TM Distance: >3 FB Neck ROM: Full    Dental  (+) Teeth Intact, Dental Advisory Given, Chipped,    Pulmonary neg pulmonary ROS,    Pulmonary exam normal breath sounds clear to auscultation       Cardiovascular Exercise Tolerance: Good negative cardio ROS Normal cardiovascular exam Rhythm:Regular Rate:Normal     Neuro/Psych Neuromyelitis optica    GI/Hepatic negative GI ROS, Neg liver ROS,   Endo/Other  diabetes, Type 2, Oral Hypoglycemic AgentsObesity   Renal/GU obstructing left upper ureteral stone with bilateral renal calculi     Musculoskeletal negative musculoskeletal ROS (+)   Abdominal   Peds  Hematology  (+) Blood dyscrasia, anemia ,   Anesthesia Other Findings Day of surgery medications reviewed with the patient.  Reproductive/Obstetrics                            Anesthesia Physical Anesthesia Plan  ASA: II and emergent  Anesthesia Plan: General   Post-op Pain Management:    Induction: Intravenous and Rapid sequence  PONV Risk Score and Plan: 3 and Midazolam, Ondansetron and Diphenhydramine  Airway Management Planned: Oral ETT  Additional Equipment:   Intra-op Plan:   Post-operative Plan: Extubation in OR  Informed Consent: I have reviewed the patients History and Physical, chart, labs and discussed the procedure including the risks, benefits and alternatives for the proposed anesthesia with the patient or authorized representative who has indicated his/her understanding and acceptance.   Dental advisory given  Plan Discussed with: CRNA  Anesthesia Plan Comments:         Anesthesia Quick Evaluation

## 2018-01-15 NOTE — ED Notes (Signed)
**   Bridgette Habermann- [phone] would like to be contacted prior to going to surgery and when he is out regarding location* She took all pt belonings including clothes, phone, identifcation and wheelchair**

## 2018-01-15 NOTE — Transfer of Care (Signed)
Immediate Anesthesia Transfer of Care Note  Patient: Victor Christensen  Procedure(s) Performed: CYSTOSCOPY WITH STENT PLACEMENT retrograde pylegram (Left Ureter)  Patient Location: PACU  Anesthesia Type:General  Level of Consciousness: awake  Airway & Oxygen Therapy: Patient Spontanous Breathing and Patient connected to face mask oxygen  Post-op Assessment: Report given to RN and Post -op Vital signs reviewed and stable  Post vital signs: Reviewed and stable  Last Vitals:  Vitals Value Taken Time  BP    Temp    Pulse 100 01/15/2018  6:55 AM  Resp 16 01/15/2018  6:55 AM  SpO2 97 % 01/15/2018  6:55 AM  Vitals shown include unvalidated device data.  Last Pain:  Vitals:   01/15/18 0454  TempSrc: Oral  PainSc:          Complications: No apparent anesthesia complications

## 2018-01-15 NOTE — ED Notes (Addendum)
Surgeon at bedside signing consent form.

## 2018-01-15 NOTE — ED Notes (Signed)
XR at bedside

## 2018-01-15 NOTE — Discharge Instructions (Signed)
1. You may see some blood in the urine and may have some burning with urination for 48-72 hours. You also may notice that you have to urinate more frequently or urgently after your procedure which is normal.  2. You should call should you develop an inability urinate, fever > 101, persistent nausea and vomiting that prevents you from eating or drinking to stay hydrated.  3. If you have a stent, you will likely urinate more frequently and urgently until the stent is removed and you may experience some discomfort/pain in the lower abdomen and flank especially when urinating. You may take pain medication prescribed to you if needed for pain. You may also intermittently have blood in the urine until the stent is removed.

## 2018-01-15 NOTE — ED Notes (Signed)
Patient transported to CT 

## 2018-01-15 NOTE — H&P (Signed)
H&P  Chief Complaint: *Kidney stone  History of Present Illness: 47 year old male with history of neuromyelitis optica with subsequent neurologic sequelae and prior history of urolithiasis presents with a 2 to 3-day history of fever, chills, shakes.  Presented to the ER with a history of stones, CT scan was performed revealing an obstructing left upper ureteral stone with bilateral renal calculi.  He had an infected stone treated in [place] approximately 2 years ago with stenting, antibiotic management followed by ureteroscopy.  Urologic consultation was requested.  He did have increased white blood cells in his urine, rare bacteria, and does not have an elevated white blood cell count.  He has had 2-3 urinary tract infections in his life.  He is admitted at this time for cystoscopy, stenting, antibiotic management.  Urine culture has been sent.  Past Medical History:  Diagnosis Date  . Diabetes mellitus without complication ([place])   . Neuromyelitis optica ([place])     History reviewed. No pertinent surgical history.  Home Medications:   Allergies:  Allergies  Allergen Reactions  . Oxycodone-Acetaminophen Other (See Comments) and Hives    Makes him luppy     History reviewed. No pertinent family history.  Social History:  reports that he has never smoked. He has never used smokeless tobacco. He reports previous alcohol use. He reports previous drug use.  ROS: A complete review of systems was performed.  All systems are negative except for pertinent findings as noted.  Physical Exam:  Vital signs in last 24 hours: Temp:  [98 F (36.7 C)-98.9 F (37.2 C)] 98 F (36.7 C) (12/19 0022) Pulse Rate:  [77-97] 86 (12/19 0300) Resp:  [15-17] 17 (12/19 0300) BP: (91-130)/(65-102) 130/102 (12/19 0300) SpO2:  [96 %-98 %] 96 % (12/19 0300) Weight:  [106.6 kg] 106.6 kg (12/18 2213) Constitutional:  Alert and oriented, No acute distress Cardiovascular: Regular rate  Respiratory:  Normal respiratory effort GI: Abdomen is obese, soft, nontender, nondistended, no abdominal masses. No CVAT. n. Lymphatic: No lymphadenopathy Neurologic: Grossly intact, no focal deficits Psychiatric: Normal mood and affect  Laboratory Data:  Recent Labs    01/15/18 0039  WBC 6.6  HGB 12.5*  HCT 41.0  PLT 225    Recent Labs    01/15/18 0039  NA 135  K 3.6  CL 101  GLUCOSE 117*  BUN 13  CALCIUM 9.1  CREATININE 1.17     Results for orders placed or performed during the hospital encounter of 01/14/18 (from the past 24 hour(s))  Urinalysis, Routine w reflex microscopic     Status: Abnormal   Collection Time: 01/15/18 12:22 AM  Result Value Ref Range   Color, Urine YELLOW YELLOW   APPearance CLEAR CLEAR   Specific Gravity, Urine 1.009 1.005 - 1.030   pH 5.0 5.0 - 8.0   Glucose, UA NEGATIVE NEGATIVE mg/dL   Hgb urine dipstick SMALL (A) NEGATIVE   Bilirubin Urine NEGATIVE NEGATIVE   Ketones, ur NEGATIVE NEGATIVE mg/dL   Protein, ur NEGATIVE NEGATIVE mg/dL   Nitrite NEGATIVE NEGATIVE   Leukocytes, UA SMALL (A) NEGATIVE   RBC / HPF 0-5 0 - 5 RBC/hpf   WBC, UA 21-50 0 - 5 WBC/hpf   Bacteria, UA RARE (A) NONE SEEN   Squamous Epithelial / LPF 0-5 0 - 5   Mucus PRESENT   Influenza panel by PCR (type A & B)     Status: None   Collection Time: 01/15/18 12:35 AM  Result Value Ref Range  Influenza A By PCR NEGATIVE NEGATIVE   Influenza B By PCR NEGATIVE NEGATIVE  Comprehensive metabolic panel     Status: Abnormal   Collection Time: 01/15/18 12:39 AM  Result Value Ref Range   Sodium 135 135 - 145 mmol/L   Potassium 3.6 3.5 - 5.1 mmol/L   Chloride 101 98 - 111 mmol/L   CO2 24 22 - 32 mmol/L   Glucose, Bld 117 (H) 70 - 99 mg/dL   BUN 13 6 - 20 mg/dL   Creatinine, Ser 1.17 0.61 - 1.24 mg/dL   Calcium 9.1 8.9 - 10.3 mg/dL   Total Protein 7.5 6.5 - 8.1 g/dL   Albumin 4.3 3.5 - 5.0 g/dL   AST 13 (L) 15 - 41 U/L   ALT 13 0 - 44 U/L   Alkaline Phosphatase 37 (L) 38 - 126  U/L   Total Bilirubin 1.3 (H) 0.3 - 1.2 mg/dL   GFR calc non Af Amer >60 >60 mL/min   GFR calc Af Amer >60 >60 mL/min   Anion gap 10 5 - 15  CBC with Differential     Status: Abnormal   Collection Time: 01/15/18 12:39 AM  Result Value Ref Range   WBC 6.6 4.0 - 10.5 K/uL   RBC 4.99 4.22 - 5.81 MIL/uL   Hemoglobin 12.5 (L) 13.0 - 17.0 g/dL   HCT 41.0 39.0 - 52.0 %   MCV 82.2 80.0 - 100.0 fL   MCH 25.1 (L) 26.0 - 34.0 pg   MCHC 30.5 30.0 - 36.0 g/dL   RDW 14.1 11.5 - 15.5 %   Platelets 225 150 - 400 K/uL   nRBC 0.0 0.0 - 0.2 %   Neutrophils Relative % 63 %   Neutro Abs 4.2 1.7 - 7.7 K/uL   Lymphocytes Relative 19 %   Lymphs Abs 1.3 0.7 - 4.0 K/uL   Monocytes Relative 16 %   Monocytes Absolute 1.0 0.1 - 1.0 K/uL   Eosinophils Relative 1 %   Eosinophils Absolute 0.1 0.0 - 0.5 K/uL   Basophils Relative 0 %   Basophils Absolute 0.0 0.0 - 0.1 K/uL   Immature Granulocytes 1 %   Abs Immature Granulocytes 0.03 0.00 - 0.07 K/uL  Culture, blood (routine x 2)     Status: None (Preliminary result)   Collection Time: 01/15/18 12:39 AM  Result Value Ref Range   Specimen Description      BLOOD RIGHT FOREARM Performed at [facility], [address] [place], [place] [address]    Special Requests      BOTTLES DRAWN AEROBIC AND ANAEROBIC Blood Culture results may not be optimal due to an excessive volume of blood received in culture bottles Performed at [facility] [address] [place], [place] [address]    Culture PENDING    Report Status PENDING    Recent Results (from the past 240 hour(s))  Culture, blood (routine x 2)     Status: None (Preliminary result)   Collection Time: 01/15/18 12:39 AM  Result Value Ref Range Status   Specimen Description   Final    BLOOD RIGHT FOREARM Performed at [facility], [place] [address], [place], [place] [address]    Special Requests   Final    BOTTLES DRAWN AEROBIC AND ANAEROBIC Blood Culture results may not be  optimal due to an excessive volume of blood received in culture bottles Performed at [facility] [address], [place], [place] [address]  Culture PENDING  Incomplete   Report Status PENDING  Incomplete    Renal Function: Recent Labs    01/15/18 0039  CREATININE 1.17   Estimated Creatinine Clearance: 98 mL/min (by C-G formula based on SCr of 1.17 mg/dL).  Radiologic Imaging: Dg Chest Port 1 View  Result Date: 01/15/2018 CLINICAL DATA:  Fever.  Possible pneumonia on abdominal CT. EXAM: PORTABLE CHEST 1 VIEW COMPARISON:  None. FINDINGS: The heart size and mediastinal contours are within normal limits. Both lungs are clear. The visualized skeletal structures are unremarkable. IMPRESSION: Normal chest radiograph. The tree-in-bud opacities seen on the CT abdomen and pelvis are not radiographically visible. Electronically Signed   By: Ulyses Jarred M.D.   On: 01/15/2018 03:48   Ct Renal Stone Study  Result Date: 01/15/2018 CLINICAL DATA:  Flank pain and fever. Laterality not indicated. EXAM: CT ABDOMEN AND PELVIS WITHOUT CONTRAST TECHNIQUE: Multidetector CT imaging of the abdomen and pelvis was performed following the standard protocol without IV contrast. COMPARISON:  None. FINDINGS: Lower chest: Patchy peribronchial tree-in-bud nodular infiltrates in the lung bases probably representing bronchopneumonia. No pleural effusions. Hepatobiliary: Layering density in the gallbladder may represent small stones or sludge. No obvious wall thickening or infiltration. No bile duct dilatation. No focal liver lesions demonstrated on unenhanced imaging. Pancreas: Unremarkable. No pancreatic ductal dilatation or surrounding inflammatory changes. Spleen: Normal in size without focal abnormality. Adrenals/Urinary Tract: No adrenal gland nodules. Bilateral intrarenal stones, largest in the right lower pole measuring 8 mm diameter. 6 mm stone in the proximal left ureter at the ureteropelvic  junction with mild proximal hydronephrosis and stranding around the left kidney. Distal ureter is decompressed. Bladder is unremarkable. Stomach/Bowel: Stomach, small bowel, and colon are not abnormally distended. Stool fills the colon. No wall thickening or inflammatory changes appreciated. Appendix is normal. Vascular/Lymphatic: No significant vascular findings are present. Scattered retroperitoneal lymph nodes are not pathologically enlarged, likely reactive. Reproductive: Prostate is unremarkable. Other: No free air or free fluid in the abdomen. Abdominal wall musculature appears intact. Musculoskeletal: Degenerative changes in the right hip. No destructive bone lesions. IMPRESSION: 1. 6 mm stone in the proximal left ureter with moderate proximal obstruction. 2. Bilateral nonobstructing intrarenal stones. 3. Patchy peribronchial tree-in-bud nodular infiltrates in the lung bases probably representing bronchopneumonia. 4. Layering density in the gallbladder may represent small stones or sludge. Electronically Signed   By: Lucienne Capers M.D.   On: 01/15/2018 01:09   I have personally reviewed all laboratories and radiographic images. Impression/Assessment:  Probable infected obstructing left upper ureteral stone  Plan:  Admission for IV antibiotic management, urgent stenting.  I discussed cystoscopy, retrograde ureteropyelogram and left double-J stent placement with the patient and his fiance.  They understand and desire to proceed.

## 2018-01-16 ENCOUNTER — Encounter (HOSPITAL_COMMUNITY): Payer: Self-pay | Admitting: Urology

## 2018-01-16 LAB — URINE CULTURE: Special Requests: NORMAL

## 2018-01-16 LAB — GLUCOSE, CAPILLARY
GLUCOSE-CAPILLARY: 132 mg/dL — AB (ref 70–99)
Glucose-Capillary: 110 mg/dL — ABNORMAL HIGH (ref 70–99)
Glucose-Capillary: 124 mg/dL — ABNORMAL HIGH (ref 70–99)
Glucose-Capillary: 144 mg/dL — ABNORMAL HIGH (ref 70–99)
Glucose-Capillary: 112 mg/dL — ABNORMAL HIGH (ref 70–99)

## 2018-01-16 MED ORDER — IBUPROFEN 200 MG PO TABS
400.0000 mg | ORAL_TABLET | ORAL | Status: DC | PRN
  Administered 2018-01-16 – 2018-01-18 (×5): 400 mg via ORAL
  Filled 2018-01-16 (×5): qty 2

## 2018-01-16 NOTE — Progress Notes (Signed)
1 Day Post-Op Subjective: Patient reports  These feeling better.  No nausea.  Tolerating diet.  Objective: Vital signs in last 24 hours: Temp:  [99.8 F (37.7 C)-102.9 F (39.4 C)] 99.8 F (37.7 C) (12/20 0537) Pulse Rate:  [71-108] 71 (12/20 0537) Resp:  [18-20] 20 (12/20 0537) BP: (100-137)/(57-87) 129/76 (12/20 0537) SpO2:  [96 %-100 %] 96 % (12/20 0537)  Intake/Output from previous day: 12/19 0701 - 12/20 0700 In: 1840.6 [P.O.:260; I.V.:1480.6; IV Piggyback:100] Out: 2150 [Urine:2150] Intake/Output this shift: Total I/O In: 240 [P.O.:240] Out: -   Physical Exam:  Constitutional: Vital signs reviewed. WD WN in NAD   Eyes: PERRL, No scleral icterus.   Cardiovascular: RRR Pulmonary/Chest: Normal effort Extremities: No cyanosis or edema   Lab Results: Recent Labs    01/15/18 0039 01/15/18 0517  HGB 12.5* 11.6*  HCT 41.0 38.3*   BMET Recent Labs    01/15/18 0039 01/15/18 0517  NA 135  --   K 3.6  --   CL 101  --   CO2 24  --   GLUCOSE 117*  --   BUN 13  --   CREATININE 1.17 1.09  CALCIUM 9.1  --    No results for input(s): LABPT, INR in the last 72 hours. No results for input(s): LABURIN in the last 72 hours. Results for orders placed or performed during the hospital encounter of 01/14/18  Urine culture     Status: Abnormal   Collection Time: 01/15/18 12:22 AM  Result Value Ref Range Status   Specimen Description   Final    URINE, CLEAN CATCH Performed at [facility], [place] [address] [place], [place] [address]    Special Requests   Final    Normal Performed at [facility], [place] [address] [place], [place] [address]    Culture MULTIPLE SPECIES PRESENT, SUGGEST RECOLLECTION (A)  Final   Report Status 01/16/2018 FINAL  Final  Culture, blood (routine x 2)     Status: None (Preliminary result)   Collection Time: 01/15/18 12:39 AM  Result Value Ref Range Status   Specimen Description   Final    BLOOD RIGHT  FOREARM Performed at [facility], [place] [address] [place], [place] [address]    Special Requests   Final    BOTTLES DRAWN AEROBIC AND ANAEROBIC Blood Culture results may not be optimal due to an excessive volume of blood received in culture bottles Performed at [facility] [address], [place], [place] [address]    Culture   Final    NO GROWTH 1 DAY Performed at [facility], [place] [address], [place], [place] [address]    Report Status PENDING  Incomplete  Culture, blood (routine x 2)     Status: None (Preliminary result)   Collection Time: 01/15/18 12:52 AM  Result Value Ref Range Status   Specimen Description   Final    BLOOD LEFT ANTECUBITAL Performed at [facility] [address], [place], [place] [address]    Special Requests   Final    BOTTLES DRAWN AEROBIC AND ANAEROBIC Blood Culture adequate volume Performed at [facility] [address] [place], [place] [address]    Culture   Final    NO GROWTH 1 DAY Performed at [facility], [place] [address] [place], [place] [address]    Report Status PENDING  Incomplete  Urine Culture     Status: Abnormal (  Preliminary result)   Collection Time: 01/15/18  6:36 AM  Result Value Ref Range Status   Specimen Description   Final    CYSTOSCOPY Performed at [facility], [address], [place], [place] [address]    Special Requests   Final    NONE Performed at [facility], [place] [address] [place], [place] [address]    Culture (A)  Final    40,000 COLONIES/mL UNIDENTIFIED ORGANISM Performed at [facility], [place] [address] [place], [place] [address]    Report Status PENDING  Incomplete    Studies/Results: Dg Chest Port 1 View  Result Date: 01/15/2018 CLINICAL DATA:  Fever.  Possible pneumonia on abdominal CT. EXAM: PORTABLE CHEST 1 VIEW COMPARISON:  None. FINDINGS: The heart size and mediastinal contours are within  normal limits. Both lungs are clear. The visualized skeletal structures are unremarkable. IMPRESSION: Normal chest radiograph. The tree-in-bud opacities seen on the CT abdomen and pelvis are not radiographically visible. Electronically Signed   By: Ulyses Jarred M.D.   On: 01/15/2018 03:48   Dg C-arm 1-60 Min-no Report  Result Date: 01/15/2018 Fluoroscopy was utilized by the requesting physician.  No radiographic interpretation.   Ct Renal Stone Study  Result Date: 01/15/2018 CLINICAL DATA:  Flank pain and fever. Laterality not indicated. EXAM: CT ABDOMEN AND PELVIS WITHOUT CONTRAST TECHNIQUE: Multidetector CT imaging of the abdomen and pelvis was performed following the standard protocol without IV contrast. COMPARISON:  None. FINDINGS: Lower chest: Patchy peribronchial tree-in-bud nodular infiltrates in the lung bases probably representing bronchopneumonia. No pleural effusions. Hepatobiliary: Layering density in the gallbladder may represent small stones or sludge. No obvious wall thickening or infiltration. No bile duct dilatation. No focal liver lesions demonstrated on unenhanced imaging. Pancreas: Unremarkable. No pancreatic ductal dilatation or surrounding inflammatory changes. Spleen: Normal in size without focal abnormality. Adrenals/Urinary Tract: No adrenal gland nodules. Bilateral intrarenal stones, largest in the right lower pole measuring 8 mm diameter. 6 mm stone in the proximal left ureter at the ureteropelvic junction with mild proximal hydronephrosis and stranding around the left kidney. Distal ureter is decompressed. Bladder is unremarkable. Stomach/Bowel: Stomach, small bowel, and colon are not abnormally distended. Stool fills the colon. No wall thickening or inflammatory changes appreciated. Appendix is normal. Vascular/Lymphatic: No significant vascular findings are present. Scattered retroperitoneal lymph nodes are not pathologically enlarged, likely reactive. Reproductive: Prostate  is unremarkable. Other: No free air or free fluid in the abdomen. Abdominal wall musculature appears intact. Musculoskeletal: Degenerative changes in the right hip. No destructive bone lesions. IMPRESSION: 1. 6 mm stone in the proximal left ureter with moderate proximal obstruction. 2. Bilateral nonobstructing intrarenal stones. 3. Patchy peribronchial tree-in-bud nodular infiltrates in the lung bases probably representing bronchopneumonia. 4. Layering density in the gallbladder may represent small stones or sludge. Electronically Signed   By: Lucienne Capers M.D.   On: 01/15/2018 01:09    Assessment/Plan:    Postoperative day 1. Cystoscopy, left double-J stent placement for  Infected, obstructing stone.  Urine cultures growing 40,000 colonies of unidentified organism, blood cultures negative.    Continue IV antibiotic 1 more day.  I'm fine with letting him go home on Saturday with appropriate 10-14 day oral antibiotic course   We will get him in the office in a couple of weeks to discuss stone management  LOS: 1 day   Jorja Loa 01/16/2018, 12:33 PM

## 2018-01-16 NOTE — Anesthesia Postprocedure Evaluation (Signed)
Anesthesia Post Note  Patient: [name]  Procedure(s) Performed: CYSTOSCOPY WITH STENT PLACEMENT retrograde pylegram (Left Ureter)     Patient location during evaluation: PACU Anesthesia Type: General Level of consciousness: awake and alert Pain management: pain level controlled Vital Signs Assessment: post-procedure vital signs reviewed and stable Respiratory status: spontaneous breathing, nonlabored ventilation and respiratory function stable Cardiovascular status: blood pressure returned to baseline and stable Postop Assessment: no apparent nausea or vomiting Anesthetic complications: no    Last Vitals:  Vitals:   01/15/18 2312 01/16/18 0537  BP: (!) 100/57 129/76  Pulse: 88 71  Resp: 19 20  Temp: 37.8 C 37.7 C  SpO2: 99% 96%    Last Pain:  Vitals:   01/16/18 0537  TempSrc: Oral  PainSc:                  Catalina Gravel

## 2018-01-17 LAB — CBC WITH DIFFERENTIAL/PLATELET
Abs Immature Granulocytes: 0.02 10*3/uL (ref 0.00–0.07)
BASOS ABS: 0 10*3/uL (ref 0.0–0.1)
Basophils Relative: 0 %
EOS PCT: 0 %
Eosinophils Absolute: 0 10*3/uL (ref 0.0–0.5)
HCT: 38.2 % — ABNORMAL LOW (ref 39.0–52.0)
Hemoglobin: 11.6 g/dL — ABNORMAL LOW (ref 13.0–17.0)
Immature Granulocytes: 0 %
Lymphocytes Relative: 8 %
Lymphs Abs: 0.6 10*3/uL — ABNORMAL LOW (ref 0.7–4.0)
MCH: 24.7 pg — ABNORMAL LOW (ref 26.0–34.0)
MCHC: 30.4 g/dL (ref 30.0–36.0)
MCV: 81.4 fL (ref 80.0–100.0)
Monocytes Absolute: 0.9 10*3/uL (ref 0.1–1.0)
Monocytes Relative: 13 %
NRBC: 0 % (ref 0.0–0.2)
Neutro Abs: 5.6 10*3/uL (ref 1.7–7.7)
Neutrophils Relative %: 79 %
Platelets: 213 10*3/uL (ref 150–400)
RBC: 4.69 MIL/uL (ref 4.22–5.81)
RDW: 14 % (ref 11.5–15.5)
WBC: 7.1 10*3/uL (ref 4.0–10.5)

## 2018-01-17 LAB — BASIC METABOLIC PANEL
Anion gap: 12 (ref 5–15)
BUN: 13 mg/dL (ref 6–20)
CO2: 24 mmol/L (ref 22–32)
Calcium: 8.6 mg/dL — ABNORMAL LOW (ref 8.9–10.3)
Chloride: 100 mmol/L (ref 98–111)
Creatinine, Ser: 1.15 mg/dL (ref 0.61–1.24)
GFR calc non Af Amer: >60 mL/min (ref >60)
Glucose, Bld: 123 mg/dL — ABNORMAL HIGH (ref 70–99)
Potassium: 4 mmol/L (ref 3.5–5.1)
Sodium: 136 mmol/L (ref 135–145)

## 2018-01-17 LAB — GLUCOSE, CAPILLARY
Glucose-Capillary: 111 mg/dL — ABNORMAL HIGH (ref 70–99)
Glucose-Capillary: 136 mg/dL — ABNORMAL HIGH (ref 70–99)
Glucose-Capillary: 132 mg/dL — ABNORMAL HIGH (ref 70–99)
Glucose-Capillary: 188 mg/dL — ABNORMAL HIGH (ref 70–99)

## 2018-01-17 LAB — URINE CULTURE

## 2018-01-17 MED ORDER — SODIUM CHLORIDE 0.9 % IV SOLN
INTRAVENOUS | Status: DC
  Administered 2018-01-17: 23:00:00 via INTRAVENOUS

## 2018-01-17 MED ORDER — SODIUM CHLORIDE 0.9 % IV SOLN
2.0000 g | Freq: Four times a day (QID) | INTRAVENOUS | Status: DC
  Administered 2018-01-17 – 2018-01-18 (×5): 2 g via INTRAVENOUS
  Filled 2018-01-17: qty 2000
  Filled 2018-01-17 (×3): qty 2
  Filled 2018-01-17: qty 2000
  Filled 2018-01-17 (×2): qty 2

## 2018-01-17 NOTE — Progress Notes (Addendum)
Dr. Lovena Neighbours paged and made aware of 102.7 oral temp. Tylenol administered per PRN order. Will continue to monitor patient.

## 2018-01-17 NOTE — Progress Notes (Signed)
Dr. Lovena Neighbours paged again regarding elevated temperature of 101.9. Continue treatment plan already in place. No new orders at this time. Will continue to monitor.

## 2018-01-17 NOTE — Progress Notes (Addendum)
2 Days Post-Op Subjective: This patient spiked a temperature of 102.9 F last night that responded to Tylenol and ibuprofen.  His current temperature is 98.5 F.  He reports general malaise and fatigue this morning.  He denies flank pain, dysuria or hematuria.  Currently has a condom cath in place and is voiding without difficulty.  He is tolerating a regular diet and denies nausea/vomiting.  Urine culture came back positive for enterococcus sensitive to ampicillin, gentamicin and vancomycin.  He is currently on Rocephin.  Blood cultures are pending.  Objective: Vital signs in last 24 hours: Temp:  [98.5 F (36.9 C)-103 F (39.4 C)] 99 F (37.2 C) (12/21 0553) Pulse Rate:  [92-113] 92 (12/21 0553) Resp:  [18-20] 18 (12/21 0553) BP: (100-120)/(67-71) 114/71 (12/21 0553) SpO2:  [96 %-100 %] 100 % (12/21 0553)  Intake/Output from previous day: 12/20 0701 - 12/21 0700 In: 1923.8 [P.O.:240; I.V.:1683.8] Out: 550 [Urine:550]  Intake/Output this shift: No intake/output data recorded.  Physical Exam:  General: Alert and oriented CV: RRR, palpable distal pulses Lungs: CTAB, equal chest rise Abdomen: Soft, NTND, no rebound or guarding Gu: Condom catheter in place and he is voiding clear-yellow urine Ext: NT, No erythema  Lab Results: Recent Labs    01/15/18 0039 01/15/18 0517  HGB 12.5* 11.6*  HCT 41.0 38.3*   BMET Recent Labs    01/15/18 0039 01/15/18 0517  NA 135  --   K 3.6  --   CL 101  --   CO2 24  --   GLUCOSE 117*  --   BUN 13  --   CREATININE 1.17 1.09  CALCIUM 9.1  --      Studies/Results: No results found.  Assessment/Plan: Postop day 2 status post left JJ stent placement due to an obstructing UPJ stone with associated urosepsis  -He continues to spike fevers despite coverage with Rocephin.  Transition to Ampicillin until blood cultures are finalized. -The patient is tolerating a regular diet.  I will DC his IV fluids. -Given his undulating fevers and  general malaise, will continue to monitor him   LOS: 2 days   Ellison Hughs, MD [facility] Pager: [phone]  01/17/2018, 9:39 AM

## 2018-01-18 LAB — CBC
HCT: 33.9 % — ABNORMAL LOW (ref 39.0–52.0)
Hemoglobin: 10.2 g/dL — ABNORMAL LOW (ref 13.0–17.0)
MCH: 24.5 pg — ABNORMAL LOW (ref 26.0–34.0)
MCHC: 30.1 g/dL (ref 30.0–36.0)
MCV: 81.5 fL (ref 80.0–100.0)
Platelets: 207 10*3/uL (ref 150–400)
RBC: 4.16 MIL/uL — ABNORMAL LOW (ref 4.22–5.81)
RDW: 14.1 % (ref 11.5–15.5)
WBC: 7.9 10*3/uL (ref 4.0–10.5)
nRBC: 0 % (ref 0.0–0.2)

## 2018-01-18 LAB — GLUCOSE, CAPILLARY
Glucose-Capillary: 113 mg/dL — ABNORMAL HIGH (ref 70–99)
Glucose-Capillary: 116 mg/dL — ABNORMAL HIGH (ref 70–99)

## 2018-01-18 MED ORDER — AMOXICILLIN-POT CLAVULANATE 875-125 MG PO TABS: 1.0000 | ORAL_TABLET | Freq: Two times a day (BID) | ORAL | 0 refills | Status: AC

## 2018-01-18 NOTE — Progress Notes (Signed)
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 Patient is without complaint. Febrile overnight but feeling significantly better. Cultures returned with enterococcus - abx switched to Ampicillin. Tolerating food  Active Problems:   Ureteral calculus, left  Current Facility-Administered Medications  Medication Dose Route Frequency Provider Last Rate Last Dose  . 0.9 %  sodium chloride infusion   Intravenous Continuous Ceasar Mons, MD 75 mL/hr at 01/18/18 0200    . acetaminophen (TYLENOL) tablet 650 mg  650 mg Oral Q4H PRN Franchot Gallo, MD   650 mg at 01/18/18 0035  . ampicillin (OMNIPEN) 2 g in sodium chloride 0.9 % 100 mL IVPB  2 g Intravenous Q6H Ceasar Mons, MD 300 mL/hr at 01/18/18 1117 2 g at 01/18/18 1117  . enoxaparin (LOVENOX) injection 40 mg  40 mg Subcutaneous Daily Franchot Gallo, MD   40 mg at 01/18/18 7078  . ibuprofen (ADVIL,MOTRIN) tablet 400 mg  400 mg Oral Q4H PRN Ceasar Mons, MD   400 mg at 01/18/18 6754  . insulin aspart (novoLOG) injection 0-15 Units  0-15 Units Subcutaneous TID WC Franchot Gallo, MD   2 Units at 01/17/18 1740  . insulin aspart (novoLOG) injection 4 Units  4 Units Subcutaneous TID WC Franchot Gallo, MD   4 Units at 01/18/18 1115  . mycophenolate (CELLCEPT) capsule 1,000 mg  1,000 mg Oral BID Franchot Gallo, MD   1,000 mg at 01/18/18 0851  . ondansetron (ZOFRAN) injection 4 mg  4 mg Intravenous Q4H PRN Franchot Gallo, MD   4 mg at 01/17/18 1553  . oxybutynin (DITROPAN) tablet 5 mg  5 mg Oral Q8H PRN Franchot Gallo, MD      . senna (SENOKOT) tablet 8.6 mg  1 tablet Oral BID Franchot Gallo, MD   8.6 mg at 01/18/18 0851  . zolpidem  (AMBIEN) tablet 5 mg  5 mg Oral QHS PRN,MR X 1 Franchot Gallo, MD         Objective: Vital: Vitals:   01/18/18 0011 01/18/18 0127 01/18/18 0224 01/18/18 0603  BP: 111/64 101/73 99/62 120/79  Pulse: (!) 103 92 92 82  Resp: 20 20  16   Temp: (!) 102.8 F (39.3 C) (!) 101.6 F (38.7 C) 99.1 F (37.3 C) 99.6 F (37.6 C)  TempSrc: Oral Oral Oral Oral  SpO2: 96% 100% 99% 98%  Weight:      Height:       I/Os: I/O last 3 completed shifts: In: 2254.1 [P.O.:500; I.V.:1353.9; IV Piggyback:400.3] Out: 2100 [Urine:2100]  Physical Exam:  General: Patient is in no apparent distress Lungs: Normal respiratory effort, chest expands symmetrically. GI: Incisions are c/d/i.The abdomen is soft and nontender without mass. Foley: condom cath - clear urine  Ext: lower extremities symmetric  Lab Results: Recent Labs    01/17/18 0854 01/18/18 0528  WBC 7.1 7.9  HGB 11.6* 10.2*  HCT 38.2* 33.9*   Recent Labs    01/17/18 0854  NA 136  K 4.0  CL 100  CO2 24  GLUCOSE 123*  BUN 13  CREATININE 1.15  CALCIUM 8.6*   No results for input(s): LABPT, INR in the last 72 hours. No results for input(s): LABURIN in the last 72 hours. Results for orders placed or performed during the hospital encounter of 01/14/18  Urine culture  Status: Abnormal   Collection Time: 01/15/18 12:22 AM  Result Value Ref Range Status   Specimen Description   Final    URINE, CLEAN CATCH Performed at [facility], [place] [address], [place], [place] [address]    Special Requests   Final    Normal Performed at [facility], [place] [address] [place], [place] [address]    Culture MULTIPLE SPECIES PRESENT, SUGGEST RECOLLECTION (A)  Final   Report Status 01/16/2018 FINAL  Final  Culture, blood (routine x 2)     Status: None (Preliminary result)   Collection Time: 01/15/18 12:39 AM  Result Value Ref Range Status   Specimen Description   Final    BLOOD RIGHT  FOREARM Performed at [facility], [place] [address] [place], [place] [address]    Special Requests   Final    BOTTLES DRAWN AEROBIC AND ANAEROBIC Blood Culture results may not be optimal due to an excessive volume of blood received in culture bottles Performed at [place] [address], [place], [place] [address]    Culture   Final    NO GROWTH 3 DAYS Performed at [facility], [place] [address], [place], [place] [address]    Report Status PENDING  Incomplete  Culture, blood (routine x 2)     Status: None (Preliminary result)   Collection Time: 01/15/18 12:52 AM  Result Value Ref Range Status   Specimen Description   Final    BLOOD LEFT ANTECUBITAL Performed at [place] [address], [place], [place] [address]    Special Requests   Final    BOTTLES DRAWN AEROBIC AND ANAEROBIC Blood Culture adequate volume Performed at [place] [address], [place], [place] [address]    Culture   Final    NO GROWTH 3 DAYS Performed at [facility], [place] [address] [place], [place] [address]    Report Status PENDING  Incomplete  Urine Culture     Status: Abnormal   Collection Time: 01/15/18  6:36 AM  Result Value Ref Range Status   Specimen Description   Final    CYSTOSCOPY Performed at [facility], [place] [address], [place], [place] [address]    Special Requests   Final    NONE Performed at [facility], [place] [address] [place], [place] [address]    Culture 40,000 COLONIES/mL ENTEROCOCCUS FAECALIS (A)  Final   Report Status 01/17/2018 FINAL  Final   Organism ID, Bacteria ENTEROCOCCUS FAECALIS (A)  Final      Susceptibility   Enterococcus faecalis - MIC*    AMPICILLIN <=2 SENSITIVE Sensitive     VANCOMYCIN 1 SENSITIVE Sensitive     GENTAMICIN SYNERGY SENSITIVE Sensitive     * 40,000 COLONIES/mL ENTEROCOCCUS FAECALIS    Studies/Results: No results  found.  Assessment: Procedure(s): CYSTOSCOPY WITH STENT PLACEMENT retrograde pylegram, 3 Days Post-Op  doing well.  Has had some fevers intermittently that are controlled with tylenol.  Blood cultures are negative.  Plan to treat him with 14 days of amoxicillin for his enteroccoccal pyelonephritis.  Plan: D/c home today with 14 days of amoxicillin. F/u with Dahl[facility]t as scheduled.   Louis Meckel, MD Urology 01/18/2018, 11:47 AM

## 2018-01-18 NOTE — Progress Notes (Signed)
MD Winter page regarding pt's temp and BP, orders for IV fluids received.

## 2018-01-18 NOTE — Discharge Summary (Signed)
Date of admission: 01/14/2018  Date of discharge: 01/18/2018  Admission diagnosis: left infected ureteral stone  Discharge diagnosis: same, enterococcus pyelonephritis  Secondary diagnoses:  Patient Active Problem List   Diagnosis Date Noted  . Ureteral calculus, left 01/15/2018    Procedures performed: Procedure(s): CYSTOSCOPY WITH STENT PLACEMENT retrograde pylegram  History and Physical: For full details, please see admission history and physical. Briefly, Rollin Kotowski is a 47 y.o. year old patient with left infected/obstructed ureteral stone.   Hospital Course: Patient tolerated the procedure well.  He was then transferred to the floor after an uneventful PACU stay.  His hospital course was uncomplicated.   He was transitioned to ampicillin once his cultures returned.  His fever and symptoms responded appropriately to the abx.  He was deemed safe for discharge on POD#2.  Laboratory values:  Recent Labs    01/17/18 0854 01/18/18 0528  WBC 7.1 7.9  HGB 11.6* 10.2*  HCT 38.2* 33.9*   Recent Labs    01/17/18 0854  NA 136  K 4.0  CL 100  CO2 24  GLUCOSE 123*  BUN 13  CREATININE 1.15  CALCIUM 8.6*   No results for input(s): LABPT, INR in the last 72 hours. No results for input(s): LABURIN in the last 72 hours. Results for orders placed or performed during the hospital encounter of 01/14/18  Urine culture     Status: Abnormal   Collection Time: 01/15/18 12:22 AM  Result Value Ref [place] Status   Specimen Description   Final    URINE, CLEAN CATCH Performed at [facility], [place] [address] [place], [place] [address]    Special Requests   Final    Normal Performed at [facility], [place] [address], [place], [place] [address]    Culture MULTIPLE SPECIES PRESENT, SUGGEST RECOLLECTION (A)  Final   Report Status 01/16/2018 FINAL  Final  Culture, blood (routine x 2)     Status: None (Preliminary result)   Collection Time: 01/15/18  12:39 AM  Result Value Ref [place] Status   Specimen Description   Final    BLOOD RIGHT FOREARM Performed at [facility], [place] [address] [place], [place] [address]    Special Requests   Final    BOTTLES DRAWN AEROBIC AND ANAEROBIC Blood Culture results may not be optimal due to an excessive volume of blood received in culture bottles Performed at [place] [address], [place], [place] [address]    Culture   Final    NO GROWTH 3 DAYS Performed at [facility], [place] [address], [place], [place] [address]    Report Status PENDING  Incomplete  Culture, blood (routine x 2)     Status: None (Preliminary result)   Collection Time: 01/15/18 12:52 AM  Result Value Ref [place] Status   Specimen Description   Final    BLOOD LEFT ANTECUBITAL Performed at [place] [address], [place], [place] [address]    Special Requests   Final    BOTTLES DRAWN AEROBIC AND ANAEROBIC Blood Culture adequate volume Performed at [place] [address], [place], [place] [address]    Culture   Final    NO GROWTH 3 DAYS Performed at [facility], [place] [address], [place], [place] [address]    Report Status PENDING  Incomplete  Urine Culture     Status: Abnormal   Collection Time: 01/15/18  6:36 AM  Result Value Ref Range Status   Specimen Description   Final    CYSTOSCOPY Performed at [place] [address] [place], [place] [address]    Special Requests   Final    NONE Performed at [facility], [place] [address], [place], [place] [address]    Culture 40,000 COLONIES/mL ENTEROCOCCUS FAECALIS (A)  Final   Report Status 01/17/2018 FINAL  Final   Organism ID, Bacteria ENTEROCOCCUS FAECALIS (A)  Final      Susceptibility   Enterococcus faecalis - MIC*    AMPICILLIN <=2 SENSITIVE Sensitive     VANCOMYCIN 1 SENSITIVE Sensitive     GENTAMICIN SYNERGY SENSITIVE Sensitive     * 40,000  COLONIES/mL ENTEROCOCCUS FAECALIS    Disposition: Home  Discharge instruction: The patient was instructed to be ambulatory but told to refrain from heavy lifting, strenuous activity, or driving.   Discharge medications:  Allergies as of 01/18/2018      Reactions   Oxycodone-acetaminophen Other (See Comments), Hives   Makes him luppy      Medication List    TAKE these medications   amoxicillin-clavulanate 875-125 MG tablet Commonly known as:  AUGMENTIN Take 1 tablet by mouth 2 (two) times daily for 12 days.   metFORMIN 500 MG 24 hr tablet Commonly known as:  GLUCOPHAGE-XR Take 500 mg by mouth 2 (two) times daily.   mycophenolate 500 MG tablet Commonly known as:  CELLCEPT Take 1,000 mg by mouth 2 (two) times daily.   onetouch ultrasoft lancets 4 each by Other route 4 (four) times daily -  with meals and at bedtime.       Followup:  Follow-up Information    Franchot Gallo, MD.   Specialty:  Urology Why:  we will call you to set up appt Contact information: [place]  [address] [phone]

## 2018-01-20 LAB — CULTURE, BLOOD (ROUTINE X 2)
Culture: NO GROWTH
Culture: NO GROWTH
Special Requests: ADEQUATE

## 2018-02-02 ENCOUNTER — Encounter (HOSPITAL_COMMUNITY): Payer: Self-pay | Admitting: *Deleted

## 2018-02-02 ENCOUNTER — Other Ambulatory Visit: Payer: Self-pay | Admitting: Urology

## 2018-02-02 ENCOUNTER — Other Ambulatory Visit: Payer: Self-pay

## 2018-02-05 ENCOUNTER — Ambulatory Visit (HOSPITAL_COMMUNITY): Payer: Medicare Other

## 2018-02-05 ENCOUNTER — Encounter (HOSPITAL_COMMUNITY)
Admission: RE | Disposition: A | Discharge status: Home / Self Care | Payer: Self-pay | Source: Home / Self Care | Attending: Urology

## 2018-02-05 ENCOUNTER — Ambulatory Visit (HOSPITAL_COMMUNITY): Payer: Medicare Other | Admitting: Anesthesiology

## 2018-02-05 ENCOUNTER — Other Ambulatory Visit: Payer: Self-pay

## 2018-02-05 ENCOUNTER — Ambulatory Visit (HOSPITAL_COMMUNITY)
Admission: RE | Admit: 2018-02-05 | Discharge: 2018-02-05 | Disposition: A | Discharge status: Home / Self Care | Payer: Medicare Other | Attending: Urology | Admitting: Urology

## 2018-02-05 ENCOUNTER — Encounter (HOSPITAL_COMMUNITY): Payer: Self-pay | Admitting: Emergency Medicine

## 2018-02-05 DIAGNOSIS — E119 Type 2 diabetes mellitus without complications: Secondary | ICD-10-CM | POA: Insufficient documentation

## 2018-02-05 DIAGNOSIS — Z87442 Personal history of urinary calculi: Secondary | ICD-10-CM | POA: Diagnosis present

## 2018-02-05 DIAGNOSIS — Z885 Allergy status to narcotic agent status: Secondary | ICD-10-CM | POA: Diagnosis not present

## 2018-02-05 DIAGNOSIS — Z87891 Personal history of nicotine dependence: Secondary | ICD-10-CM | POA: Diagnosis not present

## 2018-02-05 DIAGNOSIS — N202 Calculus of kidney with calculus of ureter: Secondary | ICD-10-CM | POA: Diagnosis not present

## 2018-02-05 HISTORY — PX: CYSTOSCOPY/URETEROSCOPY/HOLMIUM LASER/STENT PLACEMENT: SHX6546

## 2018-02-05 LAB — CBC
HCT: 39.6 % (ref 39.0–52.0)
Hemoglobin: 11.7 g/dL — ABNORMAL LOW (ref 13.0–17.0)
MCH: 24 pg — AB (ref 26.0–34.0)
MCHC: 29.5 g/dL — ABNORMAL LOW (ref 30.0–36.0)
MCV: 81.3 fL (ref 80.0–100.0)
Platelets: 409 10*3/uL — ABNORMAL HIGH (ref 150–400)
RBC: 4.87 MIL/uL (ref 4.22–5.81)
RDW: 14.4 % (ref 11.5–15.5)
WBC: 3.2 10*3/uL — ABNORMAL LOW (ref 4.0–10.5)
nRBC: 0 % (ref 0.0–0.2)

## 2018-02-05 LAB — URINALYSIS, ROUTINE W REFLEX MICROSCOPIC
BILIRUBIN URINE: NEGATIVE
Bilirubin Urine: NEGATIVE
Glucose, UA: NEGATIVE mg/dL
Glucose, UA: 50 mg/dL — AB
Ketones, ur: NEGATIVE mg/dL
Ketones, ur: NEGATIVE mg/dL
Nitrite: NEGATIVE
Nitrite: NEGATIVE
PROTEIN: 100 mg/dL — AB
Protein, ur: 100 mg/dL — AB
RBC / HPF: >50 RBC/hpf — ABNORMAL HIGH (ref 0–5)
Specific Gravity, Urine: 1.012 (ref 1.005–1.030)
Specific Gravity, Urine: 1.011 (ref 1.005–1.030)
pH: 6 (ref 5.0–8.0)
pH: 6 (ref 5.0–8.0)

## 2018-02-05 LAB — BASIC METABOLIC PANEL
Anion gap: 8 (ref 5–15)
BUN: 12 mg/dL (ref 6–20)
CO2: 24 mmol/L (ref 22–32)
Calcium: 9.2 mg/dL (ref 8.9–10.3)
Chloride: 104 mmol/L (ref 98–111)
Creatinine, Ser: 1.13 mg/dL (ref 0.61–1.24)
GFR calc Af Amer: >60 mL/min (ref >60)
GFR calc non Af Amer: >60 mL/min (ref >60)
Glucose, Bld: 113 mg/dL — ABNORMAL HIGH (ref 70–99)
Potassium: 4.3 mmol/L (ref 3.5–5.1)
Sodium: 136 mmol/L (ref 135–145)

## 2018-02-05 LAB — GLUCOSE, CAPILLARY
Glucose-Capillary: 116 mg/dL — ABNORMAL HIGH (ref 70–99)
Glucose-Capillary: 131 mg/dL — ABNORMAL HIGH (ref 70–99)

## 2018-02-05 LAB — HEMOGLOBIN A1C
Hgb A1c MFr Bld: 6.8 % — ABNORMAL HIGH (ref 4.8–5.6)
Mean Plasma Glucose: 148.46 mg/dL

## 2018-02-05 SURGERY — CYSTOSCOPY/URETEROSCOPY/HOLMIUM LASER/STENT PLACEMENT
Anesthesia: General | Site: Ureter | Laterality: Left

## 2018-02-05 MED ORDER — AMOXICILLIN-POT CLAVULANATE 875-125 MG PO TABS: 1.0000 | ORAL_TABLET | Freq: Two times a day (BID) | ORAL | 0 refills | Status: AC

## 2018-02-05 MED ORDER — PROPOFOL 10 MG/ML IV BOLUS
INTRAVENOUS | Status: DC | PRN
  Administered 2018-02-05: 200 mg via INTRAVENOUS

## 2018-02-05 MED ORDER — CIPROFLOXACIN IN D5W 400 MG/200ML IV SOLN
400.0000 mg | INTRAVENOUS | Status: AC
  Administered 2018-02-05: 400 mg via INTRAVENOUS
  Filled 2018-02-05: qty 200

## 2018-02-05 MED ORDER — LIDOCAINE 2% (20 MG/ML) 5 ML SYRINGE
INTRAMUSCULAR | Status: DC | PRN
  Administered 2018-02-05: 80 mg via INTRAVENOUS

## 2018-02-05 MED ORDER — ONDANSETRON HCL 4 MG/2ML IJ SOLN
INTRAMUSCULAR | Status: AC
  Filled 2018-02-05: qty 2

## 2018-02-05 MED ORDER — FENTANYL CITRATE (PF) 250 MCG/5ML IJ SOLN
INTRAMUSCULAR | Status: AC
  Filled 2018-02-05: qty 5

## 2018-02-05 MED ORDER — MIDAZOLAM HCL 5 MG/5ML IJ SOLN
INTRAMUSCULAR | Status: DC | PRN
  Administered 2018-02-05: 2 mg via INTRAVENOUS

## 2018-02-05 MED ORDER — ONDANSETRON HCL 4 MG/2ML IJ SOLN
INTRAMUSCULAR | Status: DC | PRN
  Administered 2018-02-05: 4 mg via INTRAVENOUS

## 2018-02-05 MED ORDER — FENTANYL CITRATE (PF) 100 MCG/2ML IJ SOLN: 25.0000 ug | INTRAMUSCULAR | Status: DC | PRN

## 2018-02-05 MED ORDER — IOHEXOL 300 MG/ML  SOLN
INTRAMUSCULAR | Status: DC | PRN
  Administered 2018-02-05: 9 mL via URETHRAL

## 2018-02-05 MED ORDER — SODIUM CHLORIDE 0.9 % IR SOLN
Status: DC | PRN
  Administered 2018-02-05: 6000 mL

## 2018-02-05 MED ORDER — 0.9 % SODIUM CHLORIDE (POUR BTL) OPTIME
TOPICAL | Status: DC | PRN
  Administered 2018-02-05: 1000 mL

## 2018-02-05 MED ORDER — MIDAZOLAM HCL 2 MG/2ML IJ SOLN
INTRAMUSCULAR | Status: AC
  Filled 2018-02-05: qty 2

## 2018-02-05 MED ORDER — KETOROLAC TROMETHAMINE 30 MG/ML IJ SOLN: 30.0000 mg | Freq: Once | INTRAMUSCULAR | Status: DC | PRN

## 2018-02-05 MED ORDER — PROPOFOL 10 MG/ML IV BOLUS
INTRAVENOUS | Status: AC
  Filled 2018-02-05: qty 20

## 2018-02-05 MED ORDER — DEXAMETHASONE SODIUM PHOSPHATE 10 MG/ML IJ SOLN
INTRAMUSCULAR | Status: DC | PRN
  Administered 2018-02-05: 10 mg via INTRAVENOUS

## 2018-02-05 MED ORDER — FENTANYL CITRATE (PF) 100 MCG/2ML IJ SOLN
INTRAMUSCULAR | Status: DC | PRN
  Administered 2018-02-05: 25 ug via INTRAVENOUS
  Administered 2018-02-05: 50 ug via INTRAVENOUS

## 2018-02-05 MED ORDER — LACTATED RINGERS IV SOLN
INTRAVENOUS | Status: DC
  Administered 2018-02-05 (×2): via INTRAVENOUS

## 2018-02-05 MED ORDER — PROMETHAZINE HCL 25 MG/ML IJ SOLN: 6.2500 mg | INTRAMUSCULAR | Status: DC | PRN

## 2018-02-05 SURGICAL SUPPLY — 25 items
BAG URO CATCHER STRL LF (MISCELLANEOUS) ×3 IMPLANT
BASKET LASER NITINOL 1.9FR (BASKET) ×1 IMPLANT
BASKET ZERO TIP NITINOL 2.4FR (BASKET) IMPLANT
CATH INTERMIT  6FR 70CM (CATHETERS) ×2 IMPLANT
CLOTH BEACON ORANGE TIMEOUT ST (SAFETY) ×3 IMPLANT
CONT SPEC 4OZ CLIKSEAL STRL BL (MISCELLANEOUS) ×2 IMPLANT
COVER WAND RF STERILE (DRAPES) IMPLANT
EXTRACTOR STONE NITINOL NGAGE (UROLOGICAL SUPPLIES) ×2 IMPLANT
FIBER LASER FLEXIVA 365 (UROLOGICAL SUPPLIES) IMPLANT
FIBER LASER TRAC TIP (UROLOGICAL SUPPLIES) ×2 IMPLANT
GLOVE BIOGEL M 8.0 STRL (GLOVE) ×3 IMPLANT
GOWN STRL REUS W/ TWL XL LVL3 (GOWN DISPOSABLE) ×1 IMPLANT
GOWN STRL REUS W/TWL LRG LVL3 (GOWN DISPOSABLE) ×4 IMPLANT
GOWN STRL REUS W/TWL XL LVL3 (GOWN DISPOSABLE) ×2
GUIDEWIRE ANG ZIPWIRE 038X150 (WIRE) ×1 IMPLANT
GUIDEWIRE STR DUAL SENSOR (WIRE) ×3 IMPLANT
IV NS 1000ML (IV SOLUTION) ×2
IV NS 1000ML BAXH (IV SOLUTION) ×1 IMPLANT
MANIFOLD NEPTUNE II (INSTRUMENTS) ×3 IMPLANT
PACK CYSTO (CUSTOM PROCEDURE TRAY) ×3 IMPLANT
SHEATH URETERAL 12FRX35CM (MISCELLANEOUS) ×2 IMPLANT
STENT URET 6FRX26 CONTOUR (STENTS) ×2 IMPLANT
TUBING CONNECTING 10 (TUBING) ×2 IMPLANT
TUBING CONNECTING 10' (TUBING) ×1
TUBING UROLOGY SET (TUBING) ×3 IMPLANT

## 2018-02-05 NOTE — Op Note (Signed)
Preoperative diagnosis: Left upper ureteral and left renal calculi  Postoperative diagnosis: Same  Principal procedure: Cystoscopy, left retrograde ureteropyelogram, left double-J stent extraction, left ureteroscopy, holmium laser lithotripsy and extraction of left ureteral and renal calculi, placement of 6 French by 26 cm contour double-J stent with tether, fluoroscopic interpretation  Surgeon: Mazelle Huebert  Anesthesia: General with LMA  Specimen: Stone fragments, urine for culture  Drains: 6 French by 26 cm contour double-J stent with tether  .  Estimated blood loss: Less than 25 mL  Indications: 48 year old male with recent hospitalization for an infected, obstructing left ureteral stone.  He underwent urgent stenting and antibiotic management.  He presents at this time for definitive management of left upper ureteral and renal calculi, having been stented and treated for this infection.  I have discussed the procedure with the patient.  Risks and complications were also discussed including infection, ureteral trauma, need for stenting, anesthetic complications, among others.  The patient understands these and desires to proceed.  Findings: Bladder inspection revealed normal urothelium.  The double-J stent was adequately positioned at the left ureteral orifice.  Following removal of the stent, retrograde ureteropyelogram revealed normal mid and distal ureter with a filling defect in the proximal ureter consistent with the known left ureteral stone.  Pyelocalyceal system was somewhat capacious but no filling defects were seen.  Description of procedure: The patient was properly identified and marked in the holding area.  He received preoperative IV Cipro.  He was taken to the operating room where general anesthetic was administered with the LMA.  He is placed in the dorsolithotomy position.  Genitalia and perineum were prepped and draped.  Proper timeout was performed.  32 French panendoscope  was advanced under direct vision through his urethra.  The stent was identified, grasped, and the distal end brought out through the urethra.  The stent was cannulated with the sensor tip guidewire which was advanced fluoroscopically up to the upper pole calyceal system where a curl was seen.  The stent was then removed over top of the guidewire.  I then threaded the 6 [place] open-ended catheter over top of the guidewire up to the distal ureter.  The guidewire was removed and using Omnipaque gentle retrograde ureteropyelogram was performed with the above-mentioned findings.  The guidewire was then readvanced to the upper pole calyceal system, the open-ended catheter removed, and the ureter sequentially dilated first with the obturator then the entire 12/14 ureteral access catheter.  I then passed the semirigid ureteroscope up the ureter along the guidewire.  Unfortunately, this was not long enough and the ureteral access catheter was then readvanced up the ureter.  The guidewire and obturator were removed.  Flexible ureteroscope was advanced up the conduit to the stone which was then identified.  It was too large to be brought through the access catheter and was then negotiated into the renal pelvis where using the 200 m fiber the stone was fragmented into several smaller stones quite easily using a rate of 30 Hz and power of 0.5 J.  A lower pole stone was also fragmented.  The engage basket was then used to remove all stone material from the calyceal system, easily through the access catheter.  Thorough inspection of the pyelocalyceal system at this point revealed no remaining stone material.  I then remove the ureteroscope.  Guidewire was then replaced, and the access catheter removed.  The guidewire was backloaded through the scope and under direct vision with fluoroscopy a 26 cm x 6  [place] contour double-J stent was deployed in the ureter with excellent proximal and distal curl seen with fluoroscopy and  cystoscopy, respectively.  The bladder was drained.  The tether was brought through the urethra, tied in a knot just outside the meatus and taped to the penis.  At this point, the procedure was terminated.  The patient was awakened and taken to the PACU in stable condition.  He tolerated procedure well.  He will follow-up in approximately 2 weeks.  He was told to remove the stent in 4 days.

## 2018-02-05 NOTE — Anesthesia Procedure Notes (Signed)
Procedure Name: LMA Insertion Date/Time: 02/05/2018 10:03 AM Performed by: West Pugh, CRNA Pre-anesthesia Checklist: Patient identified, Emergency Drugs available, Suction available, Patient being monitored and Timeout performed Patient Re-evaluated:Patient Re-evaluated prior to induction Oxygen Delivery Method: Circle system utilized Preoxygenation: Pre-oxygenation with 100% oxygen Induction Type: IV induction LMA: LMA inserted LMA Size: 5.0 Number of attempts: 1 Placement Confirmation: positive ETCO2 Tube secured with: Tape Dental Injury: Teeth and Oropharynx as per pre-operative assessment

## 2018-02-05 NOTE — Interval H&P Note (Signed)
History and Physical Interval Note:  02/05/2018 9:41 AM  Victor Christensen  has presented today for surgery, with the diagnosis of LEFT URETERAL STONE  The various methods of treatment have been discussed with the patient and family. After consideration of risks, benefits and other options for treatment, the patient has consented to  Procedure(s): CYSTOSCOPY/URETEROSCOPY/HOLMIUM LASER/STENT PLACEMENT (Left) as a surgical intervention .  The patient's history has been reviewed, patient examined, no change in status, stable for surgery.  I have reviewed the patient's chart and labs.  Questions were answered to the patient's satisfaction.     Lillette Boxer Wirt Hemmerich

## 2018-02-05 NOTE — H&P (Signed)
H&P  Chief Complaint:  Left kidney stone  History of Present Illness:  48 year old presents for cysto, left J2 stent extraction, left URS with  apossible HLL and extraction o fthe stone. Original presentation in mid Dec 2019 with infected/obstructing stone, treated with urgent stenting and IV abx.  Past Medical History:  Diagnosis Date  . Diabetes mellitus without complication ([place])    type 2   . 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 CYSTOSCOPY WITH STENT PLACEMENT Left 01/15/2018   Procedure: CYSTOSCOPY WITH STENT PLACEMENT retrograde pylegram;  Surgeon: Franchot Gallo, MD;  Location: [facility];  Service: Urology;  Laterality: Left;    Home Medications:  Allergies as of 02/05/2018      Reactions   Oxycodone-acetaminophen Hives, Other (See Comments)   Makes him loppy      Medication List    Notice   Cannot display discharge medications because the patient has not yet been admitted.     Allergies:  Allergies  Allergen Reactions  . Oxycodone-Acetaminophen Hives and Other (See Comments)    Makes him loppy     History reviewed. No pertinent family history.  Social History:  reports that he has quit smoking. He has never used smokeless tobacco. He reports that he does not drink alcohol or use drugs.  ROS: A complete review of systems was performed.  All systems are negative except for pertinent findings as noted.  Physical Exam:  Vital signs in last 24 hours:   Constitutional:  Alert and oriented, No acute distress Cardiovascular: Regular rate  Respiratory: Normal respiratory effort GI: Abdomen is soft, nontender, nondistended, no abdominal masses. No CVAT.  Genitourinary: Normal male phallus, testes are descended bilaterally and non-tender and without masses, scrotum is normal in appearance without lesions or masses, perineum is normal on inspection. Lymphatic: No lymphadenopathy Neurologic: Grossly intact, no focal  deficits Psychiatric: Normal mood and affect  Laboratory Data:  No results for input(s): WBC, HGB, HCT, PLT in the last 72 hours.  No results for input(s): NA, K, CL, GLUCOSE, BUN, CALCIUM, CREATININE in the last 72 hours.  Invalid input(s): CO3   No results found for this or any previous visit (from the past 24 hour(s)). No results found for this or any previous visit (from the past 240 hour(s)).  Renal Function: No results for input(s): CREATININE in the last 168 hours. CrCl cannot be calculated (Unknown ideal weight.).  Radiologic Imaging: No results found.  Impression/Assessment:  Left upper ureteral stone, s/p senting  Plan:  Cysto, left J2 stent extraction, left URS, HLL and extraction of stone with possible J2 stent replacement

## 2018-02-05 NOTE — Transfer of Care (Signed)
Immediate Anesthesia Transfer of Care Note  Patient: Victor Christensen  Procedure(s) Performed: CYSTOSCOPY/URETEROSCOPY/HOLMIUM LASER/STENT EXTRACTION WITH LEFT STENT PLACEMENT (Left Ureter)  Patient Location: PACU  Anesthesia Type:General  Level of Consciousness: drowsy and patient cooperative  Airway & Oxygen Therapy: Patient Spontanous Breathing and Patient connected to face mask oxygen  Post-op Assessment: Report given to RN and Post -op Vital signs reviewed and stable  Post vital signs: Reviewed and stable  Last Vitals:  Vitals Value Taken Time  BP 138/78 02/05/2018 11:04 AM  Temp    Pulse 69 02/05/2018 11:07 AM  Resp 14 02/05/2018 11:07 AM  SpO2 100 % 02/05/2018 11:07 AM  Vitals shown include unvalidated device data.  Last Pain:  Vitals:   02/05/18 0817  TempSrc: Oral  PainSc:       Patients Stated Pain Goal: 4 (07/37/10 6269)  Complications: No apparent anesthesia complications

## 2018-02-05 NOTE — Discharge Instructions (Signed)
1. You may see some blood in the urine and may have some burning with urination for 48-72 hours. You also may notice that you have to urinate more frequently or urgently after your procedure which is normal.  2. You should call should you develop an inability urinate, fever > 101, persistent nausea and vomiting that prevents you from eating or drinking to stay hydrated.  3. If you have a stent, you will likely urinate more frequently and urgently until the stent is removed and you may experience some discomfort/pain in the lower abdomen and flank especially when urinating. You may take pain medication prescribed to you if needed for pain. You may also intermittently have blood in the urine until the stent is removed. OK to remove stent on Monday

## 2018-02-05 NOTE — Anesthesia Preprocedure Evaluation (Signed)
Anesthesia Evaluation  Patient identified by MRN, date of birth, ID band Patient awake    Reviewed: Allergy & Precautions, NPO status , Patient's Chart, lab work & pertinent test results  Airway Mallampati: II  TM Distance: >3 FB Neck ROM: Full    Dental no notable dental hx.    Pulmonary neg pulmonary ROS, former smoker,    Pulmonary exam normal breath sounds clear to auscultation       Cardiovascular negative cardio ROS Normal cardiovascular exam Rhythm:Regular Rate:Normal     Neuro/Psych negative neurological ROS  negative psych ROS   GI/Hepatic negative GI ROS, Neg liver ROS,   Endo/Other  diabetes  Renal/GU negative Renal ROS  negative genitourinary   Musculoskeletal negative musculoskeletal ROS (+)   Abdominal   Peds negative pediatric ROS (+)  Hematology negative hematology ROS (+)   Anesthesia Other Findings   Reproductive/Obstetrics negative OB ROS                             Anesthesia Physical Anesthesia Plan  ASA: II  Anesthesia Plan: General   Post-op Pain Management:    Induction: Intravenous  PONV Risk Score and Plan: 2 and Ondansetron, Dexamethasone and Treatment may vary due to age or medical condition  Airway Management Planned: LMA  Additional Equipment:   Intra-op Plan:   Post-operative Plan: Extubation in OR  Informed Consent: I have reviewed the patients History and Physical, chart, labs and discussed the procedure including the risks, benefits and alternatives for the proposed anesthesia with the patient or authorized representative who has indicated his/her understanding and acceptance.   Dental advisory given  Plan Discussed with: CRNA and Surgeon  Anesthesia Plan Comments:         Anesthesia Quick Evaluation

## 2018-02-05 NOTE — Anesthesia Postprocedure Evaluation (Signed)
Anesthesia Post Note  Patient: Victor Christensen  Procedure(s) Performed: CYSTOSCOPY/URETEROSCOPY/HOLMIUM LASER/STENT EXTRACTION WITH LEFT STENT PLACEMENT (Left Ureter)     Patient location during evaluation: PACU Anesthesia Type: General Level of consciousness: awake and alert Pain management: pain level controlled Vital Signs Assessment: post-procedure vital signs reviewed and stable Respiratory status: spontaneous breathing, nonlabored ventilation, respiratory function stable and patient connected to nasal cannula oxygen Cardiovascular status: blood pressure returned to baseline and stable Postop Assessment: no apparent nausea or vomiting Anesthetic complications: no    Last Vitals:  Vitals:   02/05/18 1145 02/05/18 1229  BP:  106/63  Pulse: 71 74  Resp:  16  Temp:  36.4 C  SpO2: 96% 95%    Last Pain:  Vitals:   02/05/18 1229  TempSrc:   PainSc: 0-No pain                 [name] S

## 2018-02-06 ENCOUNTER — Encounter (HOSPITAL_COMMUNITY): Payer: Self-pay | Admitting: Urology

## 2018-02-06 LAB — URINE CULTURE: Culture: NO GROWTH

## 2018-09-16 ENCOUNTER — Telehealth: Payer: Self-pay | Admitting: Neurology

## 2018-09-16 ENCOUNTER — Encounter

## 2018-09-16 ENCOUNTER — Other Ambulatory Visit: Payer: Self-pay

## 2018-09-16 ENCOUNTER — Encounter: Payer: Self-pay | Admitting: Neurology

## 2018-09-16 ENCOUNTER — Ambulatory Visit (INDEPENDENT_AMBULATORY_CARE_PROVIDER_SITE_OTHER): Payer: Medicare Other | Admitting: Neurology

## 2018-09-16 DIAGNOSIS — G369 Acute disseminated demyelination, unspecified: Secondary | ICD-10-CM | POA: Insufficient documentation

## 2018-09-16 MED ORDER — MYCOPHENOLATE MOFETIL 500 MG PO TABS: 1000.0000 mg | ORAL_TABLET | Freq: Two times a day (BID) | ORAL | 4 refills | Status: DC

## 2018-09-16 MED ORDER — METFORMIN HCL ER 500 MG PO TB24: 500.0000 mg | ORAL_TABLET | Freq: Three times a day (TID) | ORAL | 1 refills | Status: DC

## 2018-09-16 MED ORDER — DIAZEPAM 5 MG PO TABS: 5.0000 mg | ORAL_TABLET | ORAL | 0 refills | Status: DC | PRN

## 2018-09-16 NOTE — Telephone Encounter (Signed)
[employer] medicare order sent to GI. No auth they will reach out to the patient to schedule.  

## 2018-09-16 NOTE — Progress Notes (Signed)
PATIENT: Victor Christensen DOB: 04/22/1970  Chief Complaint  Patient presents with  . Neuromyelitis Optica    He is here with his wife, Victor Christensen, to establish new neurology care.  He recently moved here from [facility].   Marland Kitchen PCP    No PCP - looking for one now (referred from his former neurologist, Fayette Pho, MD)     HISTORICAL  Victor Christensen is a 48 year old male, accompanied by his wife Victor Christensen, seen in request by previous neurologist Dr. 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  I have reviewed and summarized the referring note from the referring physician.  He had a past medical history of diabetes since 2001, taking Metformin  In 2012, he presented with subacute onset of numbness upper and lower extremity, gait abnormality, incontinence, there was no visual loss  MRI of brain was negative, cervical spine with and without contrast showed contrast-enhancing cervical lesions, thoracic spine also showed contrast-enhancing upper cord edema,  CSF showed WBC of 215, protein of 258, 2 oligoclonal banding, IgG index was mildly elevated 0.7, serum NML antibody was negative, visual evoked potential was negative  He was diagnosed with neuromyelitis, was treated with rituximab 1000 mg IV on June 2012, August 07, 2010, January 2013, August 2013, February 2014, with treatment, he was able to regain some function, able to ambulate with walker, upper extremity strength mostly recovered.  While taking rituximab infusion, he had a few flareups, presented worsening bilateral lower extremity weakness, sensory changes, eventually lost mobility in April 2014, become wheelchair-bound, he was seen at [facility] in 2014, was put on CellCept 500 mg 2 tablets twice a day ever since  He had another bouts of flareup in 2016, presented with worsening lower extremity neuropathic pain, He was treated with IV Solu-Medrol in October 2016, followed by  plasma exchange in October 2016, with significant improvement of his lower extremity neuropathic pain  He has been on stable dose of CellCept 500 mg twice a day since.  He spent most of the day in wheelchair, has fingertips paresthesia, mild hand grip weakness, but overall upper extremity function well, able to transfer himself in and out of wheelchair, driving with a modified car, keep a bowel regimen, wear depends.  REVIEW OF SYSTEMS: Full 14 system review of systems performed and notable only for as above All other review of systems were negative.  ALLERGIES: Allergies  Allergen Reactions  . Oxycodone-Acetaminophen Hives and Other (See Comments)    Makes him loopy     HOME MEDICATIONS: Current Outpatient Medications  Medication Sig Dispense Refill  . Lancets (ONETOUCH ULTRASOFT) lancets 4 each by Other route 4 (four) times daily -  with meals and at bedtime.    . metFORMIN (GLUCOPHAGE-XR) 500 MG 24 hr tablet Take 500 mg by mouth 3 (three) times daily.     . mycophenolate (CELLCEPT) 500 MG tablet Take 1,000 mg by mouth 2 (two) times daily.     No current facility-administered medications for this visit.     PAST MEDICAL HISTORY: Past Medical History:  Diagnosis Date  . Anxiety   . Diabetes mellitus without complication ([place])    type 2   . Neuromyelitis optica ([place])     PAST SURGICAL HISTORY: Past Surgical History:  Procedure Laterality Date  . CYSTOSCOPY WITH STENT PLACEMENT Left 01/15/2018   Procedure: CYSTOSCOPY WITH STENT PLACEMENT retrograde pylegram;  Surgeon: Franchot Gallo, MD;  Location: [facility];  Service: Urology;  Laterality: Left;  . CYSTOSCOPY/URETEROSCOPY/HOLMIUM LASER/STENT PLACEMENT Left 02/05/2018   Procedure: CYSTOSCOPY/URETEROSCOPY/HOLMIUM LASER/STENT EXTRACTION WITH LEFT STENT PLACEMENT;  Surgeon: Franchot Gallo, MD;  Location: [facility];  Service: Urology;  Laterality: Left;    FAMILY HISTORY: Family History  Problem Relation Age of Onset  .  Hypertension Mother   . Hypertension Father   . Diabetes Father     SOCIAL HISTORY: Social History   Socioeconomic History  . Marital status: Single    Spouse name: Not on file  . Number of children: 2  . Years of education: college  . Highest education level: Bachelor's degree (e.g., BA, AB, BS)  Occupational History  . Occupation: Disabled  Social Needs  . Financial resource strain: Not on file  . Food insecurity    Worry: Not on file    Inability: Not on file  . Transportation needs    Medical: Not on file    Non-medical: Not on file  Tobacco Use  . Smoking status: Former Research scientist (life sciences)  . Smokeless tobacco: Never Used  Substance and Sexual Activity  . Alcohol use: Yes    Frequency: Never    Comment: social  . Drug use: Never  . Sexual activity: Not on file  Lifestyle  . Physical activity    Days per week: Not on file    Minutes per session: Not on file  . Stress: Not on file  Relationships  . Social Herbalist on phone: Not on file    Gets together: Not on file    Attends religious service: Not on file    Active member of club or organization: Not on file    Attends meetings of clubs or organizations: Not on file    Relationship status: Not on file  . Intimate partner violence    Fear of current or ex partner: Not on file    Emotionally abused: Not on file    Physically abused: Not on file    Forced sexual activity: Not on file  Other Topics Concern  . Not on file  Social History Narrative   Lives at home with his wife and son.   Right-handed.   Moderate caffeine use.     PHYSICAL EXAM   Vitals:   09/16/18 0804  BP: 113/75  Pulse: 84  Temp: 98.4 F (36.9 C)    Not recorded      There is no height or weight on file to calculate BMI.  PHYSICAL EXAMNIATION:  Gen: NAD, conversant, well nourised, obese, well groomed                     Cardiovascular: Regular rate rhythm, no peripheral edema, warm, nontender. Eyes: Conjunctivae clear  without exudates or hemorrhage Neck: Supple, no carotid bruits. Pulmonary: Clear to auscultation bilaterally   NEUROLOGICAL EXAM:  MENTAL STATUS: Speech:    Speech is normal; fluent and spontaneous with normal comprehension.  Cognition:     Orientation to time, place and person     Normal recent and remote memory     Normal Attention span and concentration     Normal Language, naming, repeating,spontaneous speech     Fund of knowledge   CRANIAL NERVES: CN II: Visual fields are full to confrontation.Pupils are round equal and briskly reactive to light. CN III, IV, VI: extraocular movement are normal. No ptosis. CN V: Facial sensation is intact to pinprick in all 3 divisions bilaterally. Corneal responses are intact.  CN VII: Face  is symmetric with normal eye closure and smile. CN VIII: Hearing is normal to rubbing fingers CN IX, X: Palate elevates symmetrically. Phonation is normal. CN XI: Head turning and shoulder shrug are intact CN XII: Tongue is midline with normal movements and no atrophy.  MOTOR: He has mild weakness of bilateral shoulder abduction, external rotation, elbow flexion, finger extension and grip  He has no significant movement to lower extremity proximal distal muscles  REFLEXES: Reflexes are 1 and symmetric at the biceps, triceps, reactive at knees, and ankles.  SENSORY: Sensory level to T7  COORDINATION:  Normal finger-to-nose, no dysmetria  GAIT/STANCE: Gait was deferred   DIAGNOSTIC DATA (LABS, IMAGING, TESTING) - I reviewed patient records, labs, notes, testing and imaging myself where available.   ASSESSMENT AND PLAN  Khalel Alms is a 48 y.o. male    Neuromyelitis  Evidence of cervical, and thoracic spine involvement,  No visual loss, negative NMO normal visual evoked potential in the past  Worsening neurological symptoms previously while taking rituximab treatment, symptoms have been stabilized since starting CellCept 500 mg 2 tablets  twice a day  Referred to ophthalmologist  MRI of brain, cervical, thoracic spine with without contrast  Laboratory evaluations  Marcial Pacas, M.D. Ph.D.  [facility] [address] [place],  [address] Ph: [phone] Fax: [phone]  CC: Referring Provider

## 2018-09-18 ENCOUNTER — Telehealth: Payer: Self-pay | Admitting: Neurology

## 2018-09-18 LAB — CBC WITH DIFFERENTIAL/PLATELET
Basophils Absolute: 0 10*3/uL (ref 0.0–0.2)
Basos: 1 %
EOS (ABSOLUTE): 0.1 10*3/uL (ref 0.0–0.4)
Eos: 3 %
Hematocrit: 41.8 % (ref 37.5–51.0)
Hemoglobin: 13.3 g/dL (ref 13.0–17.7)
Immature Grans (Abs): 0 10*3/uL (ref 0.0–0.1)
Immature Granulocytes: 1 %
Lymphocytes Absolute: 0.9 10*3/uL (ref 0.7–3.1)
Lymphs: 30 %
MCH: 25 pg — ABNORMAL LOW (ref 26.6–33.0)
MCHC: 31.8 g/dL (ref 31.5–35.7)
MCV: 79 fL (ref 79–97)
Monocytes Absolute: 0.5 10*3/uL (ref 0.1–0.9)
Monocytes: 15 %
Neutrophils Absolute: 1.5 10*3/uL (ref 1.4–7.0)
Neutrophils: 50 %
Platelets: 242 10*3/uL (ref 150–450)
RBC: 5.31 x10E6/uL (ref 4.14–5.80)
RDW: 15.3 % (ref 11.6–15.4)
WBC: 2.9 10*3/uL — ABNORMAL LOW (ref 3.4–10.8)

## 2018-09-18 LAB — COMPREHENSIVE METABOLIC PANEL
ALT: 26 IU/L (ref 0–44)
AST: 23 IU/L (ref 0–40)
Albumin/Globulin Ratio: 1.8 (ref 1.2–2.2)
Albumin: 4.8 g/dL (ref 4.0–5.0)
Alkaline Phosphatase: 48 IU/L (ref 39–117)
BUN/Creatinine Ratio: 11 (ref 9–20)
BUN: 13 mg/dL (ref 6–24)
Bilirubin Total: 0.4 mg/dL (ref 0.0–1.2)
CO2: 23 mmol/L (ref 20–29)
Calcium: 10 mg/dL (ref 8.7–10.2)
Chloride: 100 mmol/L (ref 96–106)
Creatinine, Ser: 1.21 mg/dL (ref 0.76–1.27)
GFR calc Af Amer: 82 mL/min/{1.73_m2} (ref >59)
GFR calc non Af Amer: 71 mL/min/{1.73_m2} (ref >59)
Globulin, Total: 2.6 g/dL (ref 1.5–4.5)
Glucose: 122 mg/dL — ABNORMAL HIGH (ref 65–99)
Potassium: 4.7 mmol/L (ref 3.5–5.2)
Sodium: 138 mmol/L (ref 134–144)
Total Protein: 7.4 g/dL (ref 6.0–8.5)

## 2018-09-18 LAB — VITAMIN D 25 HYDROXY (VIT D DEFICIENCY, FRACTURES): Vit D, 25-Hydroxy: 13.7 ng/mL — ABNORMAL LOW (ref 30.0–100.0)

## 2018-09-18 LAB — NEUROMYELITIS OPTICA AUTOAB, IGG: NMO IgG Autoantibodies: <1.5 U/mL (ref 0.0–3.0)

## 2018-09-18 LAB — VITAMIN B12: Vitamin B-12: 306 pg/mL (ref 232–1245)

## 2018-09-18 LAB — ANA W/REFLEX IF POSITIVE: Anti Nuclear Antibody (ANA): NEGATIVE

## 2018-09-18 LAB — HIV ANTIBODY (ROUTINE TESTING W REFLEX): HIV Screen 4th Generation wRfx: NONREACTIVE

## 2018-09-18 LAB — SEDIMENTATION RATE: Sed Rate: 26 mm/hr — ABNORMAL HIGH (ref 0–15)

## 2018-09-18 LAB — RPR: RPR Ser Ql: NONREACTIVE

## 2018-09-18 LAB — FERRITIN: Ferritin: 81 ng/mL (ref 30–400)

## 2018-09-18 LAB — TSH: TSH: 2.3 u[IU]/mL (ref 0.450–4.500)

## 2018-09-18 LAB — FOLATE: Folate: 7.3 ng/mL (ref >3.0)

## 2018-09-18 LAB — HGB A1C W/O EAG: Hgb A1c MFr Bld: 7.1 % — ABNORMAL HIGH (ref 4.8–5.6)

## 2018-09-18 LAB — COPPER, SERUM: Copper: 126 ug/dL (ref 72–166)

## 2018-09-18 LAB — C-REACTIVE PROTEIN: CRP: 1 mg/L (ref 0–10)

## 2018-09-18 LAB — CK: Total CK: 136 U/L (ref 49–439)

## 2018-09-18 NOTE — Telephone Encounter (Signed)
Pt states he's reached out to the company about his power chair that needs some service.  Pt was told he needs a prescription from Dr Krista Blue.  Please call

## 2018-09-18 NOTE — Telephone Encounter (Addendum)
I returned the call to the patient.  I left a message requesting him to have his power wheelchair company fax over the necessary paperwork for [place] to complete for the required services.  I provided him with fax [phone].

## 2018-09-21 ENCOUNTER — Telehealth: Payer: Self-pay | Admitting: Neurology

## 2018-09-21 NOTE — Addendum Note (Signed)
Addended by: Marcial Pacas on: 09/21/2018 01:37 PM   Modules accepted: Orders

## 2018-09-21 NOTE — Telephone Encounter (Signed)
Orders for MRI of brain, cervical, thoracic w/wo were placed.

## 2018-09-21 NOTE — Telephone Encounter (Signed)
[facility] arrival time is 1:30 pm. Patient is aware of time and day. He also informed me he is claustrophobic and would need something to help him.

## 2018-09-21 NOTE — Addendum Note (Signed)
Addended by: Marcial Pacas on: 09/21/2018 01:36 PM   Modules accepted: Orders

## 2018-09-21 NOTE — Telephone Encounter (Signed)
The patient is aware of his lab results and verbalized understanding.  He is going to call to set up an appt with a new PCP in this area right away.  He plans to use a PCP within the [facility] network.  He is agreeable to start the vitamin D3 supplement.

## 2018-09-21 NOTE — Telephone Encounter (Signed)
Due to the patient being in a wheel chair he'll have to go to [facility] because they have a lift for the MRI. When you get a chance can you put a new MRI orders in for [facility].

## 2018-09-21 NOTE — Telephone Encounter (Signed)
Dr. Krista Blue sent in a prescription for Valium to [facility].  I called the patient and left him a message letting him know it was there and reminded him that he will need a driver to the appt.

## 2018-09-21 NOTE — Telephone Encounter (Signed)
Please call patient, laboratory evaluation showed elevated A1c 7.8 consistent with his diagnosis of diabetes,  Vitamin D deficiency level was 14, he should start over-the-counter vitamin D3 supplement, 1000 units 2 tablets every day  CBC showed decreased WBC of 2.9, which is lower compared to previous level in January 2020, I have forwarded the results to his primary care Dr. Benjamine Mola, he needs to continue follow-up with Dr. Benjamine Mola for monitoring.  Mild elevated ESR in the setting of normal C-reactive protein has unknown clinical significance.  Rest of the laboratory evaluation showed no significant abnormalities.

## 2018-09-30 ENCOUNTER — Telehealth: Payer: Self-pay | Admitting: Neurology

## 2018-09-30 NOTE — Telephone Encounter (Signed)
I have received MRI of cervical with without contrast report from [place] on October 22, 2014  In the cervical cord, there is patchy signal abnormality, extends from the cervical medullary junction eccentric towards the left, along the central and dorsal aspect of the cord C2, 3, right dorsal cord C6, and patchy signal abnormality at C7, upper thoracic cord, on the sagittal STIR imagings or thoracic spinal cord, there are diffuse patchy signal abnormality in the cord, and mild diffuse volume loss, there is irregular linear band of enhancement cervical spinal cord extending from posterior cervical medullary junction to the C6 level, also at T1-T2, T2-T3 level finding appear to progress since most recent MRI cervical spine in 2015, but similar to more remote cervical spine imaging in 2014, no additional enhancing abnormality identified in the remainder of the thoracic spinal cord,  Tiny disc protrusion at T3-T4,

## 2018-10-07 ENCOUNTER — Ambulatory Visit (HOSPITAL_COMMUNITY): Admission: RE | Admit: 2018-10-07 | Payer: Medicare Other | Source: Ambulatory Visit

## 2018-10-07 ENCOUNTER — Ambulatory Visit (HOSPITAL_COMMUNITY)
Admission: RE | Admit: 2018-10-07 | Discharge: 2018-10-07 | Disposition: A | Discharge status: Home / Self Care | Payer: Medicare Other | Source: Ambulatory Visit | Attending: Neurology | Admitting: Neurology

## 2018-10-07 ENCOUNTER — Ambulatory Visit (HOSPITAL_COMMUNITY): Payer: Medicare Other

## 2018-10-07 ENCOUNTER — Other Ambulatory Visit: Payer: Self-pay

## 2018-10-07 DIAGNOSIS — G369 Acute disseminated demyelination, unspecified: Secondary | ICD-10-CM | POA: Diagnosis present

## 2018-10-07 MED ORDER — GADOBUTROL 1 MMOL/ML IV SOLN
10.0000 mL | Freq: Once | INTRAVENOUS | Status: AC | PRN
  Administered 2018-10-07: 10 mL via INTRAVENOUS

## 2018-10-08 ENCOUNTER — Telehealth: Payer: Self-pay | Admitting: *Deleted

## 2018-10-08 ENCOUNTER — Telehealth: Payer: Self-pay | Admitting: Neurology

## 2018-10-08 NOTE — Telephone Encounter (Signed)
-----   Message from Marcial Pacas, MD sent at 10/08/2018 11:37 AM EDT ----- Please call pt for normal MRI brain.

## 2018-10-08 NOTE — Telephone Encounter (Signed)
Called and spoke w/ pt. Relayed MRI brain normal per Dr. Krista Blue. He verbalized understanding.

## 2018-10-08 NOTE — Telephone Encounter (Signed)
Please call patient, MRI of cervical, and upper thoracic spine showed thinning, with evidence of gliosis (scars), there was no acute abnormality.   IMPRESSION: 1. Generalized cervical and upper thoracic cord thinning with patchy signal from the gliosis, correlating with remote insult in this patient with history of neuromyelitis. No swelling or enhancement to suggest active inflammation. 2. Uncovertebral spurring causes foraminal narrowings most notable on the right at C4-5.

## 2018-10-08 NOTE — Telephone Encounter (Signed)
Called and spoke with pt. Relayed results per Dr. Krista Blue note. Asked him to call back if he has new or worsening sx. He verbalized understanding.

## 2018-10-09 ENCOUNTER — Ambulatory Visit: Payer: Medicare Other | Admitting: Registered Nurse

## 2018-10-09 ENCOUNTER — Encounter: Payer: Self-pay | Admitting: Registered Nurse

## 2018-10-09 ENCOUNTER — Other Ambulatory Visit: Payer: Self-pay

## 2018-10-09 VITALS — BP 119/80 | HR 87 | Temp 98.6°F | Resp 18 | Ht 71.0 in | Wt 235.0 lb

## 2018-10-09 DIAGNOSIS — E119 Type 2 diabetes mellitus without complications: Secondary | ICD-10-CM

## 2018-10-09 MED ORDER — METFORMIN HCL 500 MG PO TABS: 500.0000 mg | ORAL_TABLET | Freq: Three times a day (TID) | ORAL | 1 refills | Status: DC

## 2018-10-09 NOTE — Progress Notes (Signed)
Established Patient Office Visit  Subjective:  Patient ID: Victor Christensen, male    DOB: 1970/10/28  Age: 48 y.o. MRN: [id number]  CC:  Chief Complaint  Patient presents with  . Establish Care    need pcp to manage medications and Chronic Conditions   . Medication Refill    HPI Victor Christensen presents for visit to establish care. Recently relocated to [facility] from [place].   He has a medical history significant for T2DM well controlled with medication and Neuromyelitis optica. He has established care with [facility] already, who ran labs - most importantly noting that his A1c is at 7.1%.  He takes metformin 554m PO tid wc with good effect. No complaints at this time.  He is largely wheelchair bound d/t his neuromyelitis.   Past Medical History:  Diagnosis Date  . Anxiety   . [place]    type 2   . [place]     Past Surgical History:  Procedure Laterality Date  . CYSTOSCOPY WITH STENT PLACEMENT Left 01/15/2018   Procedure: CYSTOSCOPY WITH STENT PLACEMENT retrograde pylegram;  Surgeon: DFranchot Gallo MD;  [facility];  Service: Urology;  Laterality: Left;  . CYSTOSCOPY/URETEROSCOPY/HOLMIUM LASER/STENT PLACEMENT Left 02/05/2018   Procedure: CYSTOSCOPY/URETEROSCOPY/HOLMIUM LASER/STENT EXTRACTION WITH LEFT STENT PLACEMENT;  Surgeon: DFranchot Gallo MD;  Location: [facility];  Service: Urology;  Laterality: Left;    Family History  Problem Relation Age of Onset  . Hypertension Mother   . Hypertension Father   . Diabetes Father     Social History   Socioeconomic History  . Marital status: Married    Spouse name: Not on file  . Number of children: 2  . Years of education: college  . Highest education level: Bachelor's degree (e.g., BA, AB, BS)  Occupational History  . Occupation: Disabled  Social Needs  . Financial resource strain: Not hard at all  . Food insecurity    Worry: Never true    Inability: Never  true  . Transportation needs    Medical: No    Non-medical: No  Tobacco Use  . Smoking status: Never Smoker  . Smokeless tobacco: Never Used  Substance and Sexual Activity  . Alcohol use: Not Currently    Frequency: Never    Comment: social  . Drug use: Never  . Sexual activity: Not Currently  Lifestyle  . Physical activity    Days per week: 3 days    Minutes per session: 30 min  . Stress: To some extent  Relationships  . Social cHerbaliston phone: Three times a week    Gets together: Twice a week    Attends religious service: Patient refused    Active member of club or organization: Patient refused    Attends meetings of clubs or organizations: Patient refused    Relationship status: Married  . Intimate partner violence    Fear of current or ex partner: No    Emotionally abused: No    Physically abused: No    Forced sexual activity: No  Other Topics Concern  . Not on file  Social History Narrative   Lives at home with his wife and son.   Right-handed.   Moderate caffeine use.    Outpatient Medications Prior to Visit  Medication Sig Dispense Refill  . Cholecalciferol (VITAMIN D3 PO) Take 2,000 Units by mouth daily.    . Lancets (ONETOUCH ULTRASOFT) lancets 4 each by Other route 4 (  four) times daily -  with meals and at bedtime.    . metFORMIN (GLUCOPHAGE-XR) 500 MG 24 hr tablet Take 1 tablet (500 mg total) by mouth 3 (three) times daily. 270 tablet 1  . mycophenolate (CELLCEPT) 500 MG tablet Take 2 tablets (1,000 mg total) by mouth 2 (two) times daily. 360 tablet 4  . diazepam (VALIUM) 5 MG tablet Take 1 tablet (5 mg total) by mouth as needed. Take 1-2 tablets 30 minutes prior to MRI, may repeat once as needed. Must have driver. (Patient not taking: Reported on 10/09/2018) 5 tablet 0   No facility-administered medications prior to visit.     Allergies  Allergen Reactions  . Oxycodone-Acetaminophen Hives and Other (See Comments)    Makes him loopy      ROS Review of Systems  Constitutional: Negative.   HENT: Negative.   Eyes: Negative.   Respiratory: Negative.   Cardiovascular: Negative.   Gastrointestinal: Negative.   Endocrine: Negative.   Genitourinary: Negative.   Musculoskeletal: Positive for arthralgias (pain at olecranon process R side when exercising).  Skin: Negative.   Allergic/Immunologic: Negative.   Neurological: Negative.   Hematological: Negative.   Psychiatric/Behavioral: Negative.   All other systems reviewed and are negative.     Objective:    Physical Exam  Constitutional: He is oriented to person, place, and time. He appears well-developed and well-nourished. No distress.  Cardiovascular: Normal rate and regular rhythm.  Pulmonary/Chest: Effort normal. No respiratory distress.  Neurological: He is alert and oriented to person, place, and time.  Skin: Skin is warm and dry. No rash noted. He is not diaphoretic. No erythema. No pallor.  Psychiatric: He has a normal mood and affect. His behavior is normal. Judgment and thought content normal.  Nursing note and vitals reviewed.   BP 119/80   Pulse 87   Temp 98.6 F (37 C) (Oral)   Resp 18   Ht 5' 11"  (1.803 m)   Wt 235 lb (106.6 kg)   SpO2 100%   BMI 32.78 kg/m  Wt Readings from Last 3 Encounters:  10/09/18 235 lb (106.6 kg)  02/05/18 233 lb 11 oz (106 kg)  01/14/18 235 lb (106.6 kg)     Health Maintenance Due  Topic Date Due  . URINE MICROALBUMIN  08/08/1981  . TETANUS/TDAP  08/09/1990    There are no preventive care reminders to display for this patient.  Lab Results  Component Value Date   TSH 2.300 09/16/2018   Lab Results  Component Value Date   WBC 2.9 (L) 09/16/2018   HGB 13.3 09/16/2018   HCT 41.8 09/16/2018   MCV 79 09/16/2018   PLT 242 09/16/2018   Lab Results  Component Value Date   NA 138 09/16/2018   K 4.7 09/16/2018   CO2 23 09/16/2018   GLUCOSE 122 (H) 09/16/2018   BUN 13 09/16/2018   CREATININE 1.21  09/16/2018   BILITOT 0.4 09/16/2018   ALKPHOS 48 09/16/2018   AST 23 09/16/2018   ALT 26 09/16/2018   PROT 7.4 09/16/2018   ALBUMIN 4.8 09/16/2018   CALCIUM 10.0 09/16/2018   ANIONGAP 8 02/05/2018   No results found for: CHOL No results found for: HDL No results found for: LDLCALC No results found for: TRIG No results found for: CHOLHDL Lab Results  Component Value Date   HGBA1C 7.1 (H) 09/16/2018      Assessment & Plan:   Problem List Items Addressed This Visit      Endocrine  Type 2 diabetes mellitus without complication, without long-term current use of insulin (HCC) - Primary   Relevant Medications   metFORMIN (GLUCOPHAGE) 500 MG tablet      Meds ordered this encounter  Medications  . metFORMIN (GLUCOPHAGE) 500 MG tablet    Sig: Take 1 tablet (500 mg total) by mouth 3 (three) times daily with meals.    Dispense:  270 tablet    Refill:  1    Order Specific Question:   Supervising Provider    Answer:   Forrest Moron [id number]    Follow-up: Return in about 6 months (around 04/08/2019) for med check, a1c, cpe.   PLAN  Refilled metformin - would not typically give tidwc, but this regimen has been working for him  Feels he is in good hands with [facility] - I am in agreement  Overall feels well, no urgent complaints, just wanted to set up PCP  Patient encouraged to call clinic with any questions, comments, or concerns.   Maximiano Coss, NP

## 2018-10-09 NOTE — Patient Instructions (Signed)
° ° ° °  If you have lab work done today you will be contacted with your lab results within the next 2 weeks.  If you have not heard from us then please contact us. The fastest way to get your results is to register for My Chart. ° ° °IF you received an x-ray today, you will receive an invoice from [facility]. Please contact [facility] at [phone] with questions or concerns regarding your invoice.  ° °IF you received labwork today, you will receive an invoice from [facility]. Please contact [facility] at [phone] with questions or concerns regarding your invoice.  ° °Our billing staff will not be able to assist you with questions regarding bills from these companies. ° °You will be contacted with the lab results as soon as they are available. The fastest way to get your results is to activate your My Chart account. Instructions are located on the last page of this paperwork. If you have not heard from us regarding the results in 2 weeks, please contact this office. °  ° ° ° °

## 2018-10-19 ENCOUNTER — Telehealth: Payer: Self-pay | Admitting: *Deleted

## 2018-10-19 ENCOUNTER — Telehealth: Payer: Self-pay

## 2018-10-19 NOTE — Telephone Encounter (Signed)
One page physician's certification form completed and returned to medical records.

## 2018-10-19 NOTE — Telephone Encounter (Signed)
Pt certification form @ the front desk for p/u

## 2018-10-19 NOTE — Telephone Encounter (Signed)
I returned the call to the patient.  States it is not disability forms but ten additional questions they need about his physical state.  He will drop the ppw off for review.

## 2018-11-04 ENCOUNTER — Telehealth: Payer: Self-pay | Admitting: Neurology

## 2018-11-04 ENCOUNTER — Other Ambulatory Visit: Payer: Self-pay

## 2018-11-04 ENCOUNTER — Encounter (INDEPENDENT_AMBULATORY_CARE_PROVIDER_SITE_OTHER): Payer: Medicare Other | Admitting: Ophthalmology

## 2018-11-04 DIAGNOSIS — H33301 Unspecified retinal break, right eye: Secondary | ICD-10-CM | POA: Diagnosis not present

## 2018-11-04 DIAGNOSIS — H35413 Lattice degeneration of retina, bilateral: Secondary | ICD-10-CM

## 2018-11-04 DIAGNOSIS — H2513 Age-related nuclear cataract, bilateral: Secondary | ICD-10-CM

## 2018-11-04 DIAGNOSIS — H33022 Retinal detachment with multiple breaks, left eye: Secondary | ICD-10-CM

## 2018-11-04 DIAGNOSIS — H43813 Vitreous degeneration, bilateral: Secondary | ICD-10-CM

## 2018-11-04 NOTE — Telephone Encounter (Signed)
I reviewed ophthalmologist Dr. 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

## 2018-11-11 ENCOUNTER — Telehealth: Payer: Self-pay | Admitting: Neurology

## 2018-11-11 ENCOUNTER — Encounter (INDEPENDENT_AMBULATORY_CARE_PROVIDER_SITE_OTHER): Payer: Medicare Other | Admitting: Ophthalmology

## 2018-11-11 DIAGNOSIS — G369 Acute disseminated demyelination, unspecified: Secondary | ICD-10-CM

## 2018-11-11 DIAGNOSIS — H33301 Unspecified retinal break, right eye: Secondary | ICD-10-CM

## 2018-11-11 MED ORDER — GABAPENTIN 300 MG PO CAPS: 600.0000 mg | ORAL_CAPSULE | Freq: Three times a day (TID) | ORAL | 11 refills | Status: DC

## 2018-11-11 NOTE — Addendum Note (Signed)
Addended by: Noberto Retort C on: 11/11/2018 02:46 PM   Modules accepted: Orders

## 2018-11-11 NOTE — Telephone Encounter (Signed)
I returned the call to the patient.  Reports neck pain with radiating numbness/tingling to right arm and into fingers 2 through 4.  He has gabapentin 870m leftover from a previous prescription.  He has tried 1-2 capsules that has eased the pain off but does not resolve it completely.  He only has a few capsules left.  Symptoms have been intermittent for 3-4 days.  He is concerned this may represent an exacerbation and wonders if he needs steroids.

## 2018-11-11 NOTE — Telephone Encounter (Signed)
I returned the call to the patient.  He is agreeable to try the recommended gabapentin and verbalized understanding of the dosage.  He will call us back, if his symptoms continue to worsen.  Additionally, he needs a prescription stating that his power wheelchair needs evaluation and repair as needed.  This has been printed, signed and placed in the mail to him per his request (home address verified).

## 2018-11-11 NOTE — Telephone Encounter (Signed)
I have reviewed his MRIs, with his extensive cervical cord lesion, would not surprise experience intermittent neuropathic pain,  I have given him more gabapentin Rx 38m 2 tab tid.  If his symptoms continue to getting worse with gabapentin treatment, may consider other options

## 2018-11-11 NOTE — Addendum Note (Signed)
Addended by: Marcial Pacas on: 11/11/2018 01:22 PM   Modules accepted: Orders

## 2018-11-11 NOTE — Telephone Encounter (Signed)
Pt called in and stated he is having pain and numbness and tingling in his right arm, he states this is a new symptom.

## 2018-11-24 ENCOUNTER — Other Ambulatory Visit: Payer: Self-pay

## 2018-11-24 ENCOUNTER — Encounter (INDEPENDENT_AMBULATORY_CARE_PROVIDER_SITE_OTHER): Payer: Medicare Other | Admitting: Ophthalmology

## 2018-11-24 DIAGNOSIS — H33303 Unspecified retinal break, bilateral: Secondary | ICD-10-CM

## 2018-11-25 ENCOUNTER — Other Ambulatory Visit: Payer: Self-pay | Admitting: Registered Nurse

## 2019-03-22 ENCOUNTER — Encounter: Payer: Self-pay | Admitting: Neurology

## 2019-03-22 ENCOUNTER — Other Ambulatory Visit: Payer: Self-pay

## 2019-03-22 ENCOUNTER — Ambulatory Visit: Payer: Medicare PPO | Admitting: Neurology

## 2019-03-22 VITALS — BP 110/63 | HR 101 | Temp 97.2°F

## 2019-03-22 DIAGNOSIS — R899 Unspecified abnormal finding in specimens from other organs, systems and tissues: Secondary | ICD-10-CM

## 2019-03-22 DIAGNOSIS — G369 Acute disseminated demyelination, unspecified: Secondary | ICD-10-CM | POA: Diagnosis not present

## 2019-03-22 NOTE — Progress Notes (Signed)
PATIENT: Victor Christensen DOB: 12/21/1970  Chief Complaint  Patient presents with  . Neuromyelitis    He is here alone in electric WC. In new room. No new sx, doing well.      HISTORICAL  Victor Christensen is a 49 year old male, accompanied by his wife [name], seen in request by previous neurologist Dr. 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  I have reviewed and summarized the referring note from the referring physician.  He had a past medical history of diabetes since 2001, taking Metformin  In 2012, he presented with subacute onset of numbness upper and lower extremity, gait abnormality, incontinence, there was no visual loss  MRI of brain was negative, cervical spine with and without contrast showed contrast-enhancing cervical lesions, thoracic spine also showed contrast-enhancing upper cord edema,  CSF showed WBC of 215, protein of 258, 2 oligoclonal banding, IgG index was mildly elevated 0.7, serum NML antibody was negative, visual evoked potential was negative  He was diagnosed with neuromyelitis, was treated with rituximab 1000 mg IV on June 2012, August 07, 2010, January 2013, August 2013, February 2014, with treatment, he was able to regain some function, able to ambulate with walker, upper extremity strength mostly recovered.  While taking rituximab infusion, he had a few flareups, presented worsening bilateral lower extremity weakness, sensory changes, eventually lost mobility in April 2014, become wheelchair-bound, he was seen at [facility] in 2014, was put on CellCept 500 mg 2 tablets twice a day ever since  He had another bouts of flareup in 2016, presented with worsening lower extremity neuropathic pain, He was treated with IV Solu-Medrol in October 2016, followed by plasma exchange in October 2016, with significant improvement of his lower extremity neuropathic pain  He has been on stable dose of CellCept 500 mg 2  tablets twice a day since.  He spent most of the day in wheelchair, has fingertips paresthesia, mild hand grip weakness, but overall upper extremity function well, able to transfer himself in and out of wheelchair, driving with a modified car, keep a bowel regimen, wear depends.  UPDATE Mar 22 2019: Record from [facility] on October 22, 2014, MRI of cervical spine with and without contrast, there is patchy signal abnormality, extends from the cervical medullary junction eccentric towards the left, along the central and dorsal aspect of the cord C2, 3, right dorsal cord C6, and patchy signal abnormality at C7, upper thoracic cord, on the sagittal STIR imagings or thoracic spinal cord, there are diffuse patchy signal abnormality in the cord, and mild diffuse volume loss, there is irregular linear band of enhancement cervical spinal cord extending from posterior cervical medullary junction to the C6 level, also at T1-T2, T2-T3 level finding appear to progress since most recent MRI cervical spine in 2015, but similar to more remote cervical spine imaging in 2014, no additional enhancing abnormality identified in the remainder of the thoracic spinal cord,Tiny disc protrusion at T3-T4,  I personally reviewed MRI of spines with without contrast in September 2020  There is generalized atrophy of cervical and upper thoracic spine, with patchy signal changes, correlate with remote insult in this patient, Cord: Diffusely small cord measuring only 3-4 mm anterior to posterior at the cervicothoracic junction. Cord also has heterogeneous signal on T2 and STIR imaging, attributed to gliosis from prior demyelination. No expansion/swelling and no abnormal enhancement.  MRI of the brain was normal  Laboratory evaluation in August 2020 showed  negative NMO  antibody, mild elevated A1c 7.1, CMP showed mild elevated glucose 122, creatinine of 1.21, normal or negative ANA, HIV, RPR, copper, ferritin, vitamin D  level was 13.7, decreased WBC of 2.9, ESR was mildly elevated 26, normal CPK, TSH, C-reactive protein, B12,  REVIEW OF SYSTEMS: Full 14 system review of systems performed and notable only for as above All other review of systems were negative.  ALLERGIES: Allergies  Allergen Reactions  . Oxycodone-Acetaminophen Hives and Other (See Comments)    Makes him loopy     HOME MEDICATIONS: Current Outpatient Medications  Medication Sig Dispense Refill  . Cholecalciferol (VITAMIN D3 PO) Take 2,000 Units by mouth daily.    Marland Kitchen gabapentin (NEURONTIN) 300 MG capsule Take 2 capsules (600 mg total) by mouth 3 (three) times daily. 180 capsule 11  . Lancets (ONETOUCH ULTRASOFT) lancets 4 each by Other route 4 (four) times daily -  with meals and at bedtime.    . metFORMIN (GLUCOPHAGE) 500 MG tablet Take 1 tablet (500 mg total) by mouth 3 (three) times daily with meals. 270 tablet 1  . mycophenolate (CELLCEPT) 500 MG tablet Take 2 tablets (1,000 mg total) by mouth 2 (two) times daily. 360 tablet 4   No current facility-administered medications for this visit.    PAST MEDICAL HISTORY: Past Medical History:  Diagnosis Date  . Anxiety   . Diabetes mellitus without complication ([place])    type 2   . Neuromyelitis optica ([place])     PAST SURGICAL HISTORY: Past Surgical History:  Procedure Laterality Date  . CYSTOSCOPY WITH STENT PLACEMENT Left 01/15/2018   Procedure: CYSTOSCOPY WITH STENT PLACEMENT retrograde pylegram;  Surgeon: Franchot Gallo, MD;  Location: [facility];  Service: Urology;  Laterality: Left;  . CYSTOSCOPY/URETEROSCOPY/HOLMIUM LASER/STENT PLACEMENT Left 02/05/2018   Procedure: CYSTOSCOPY/URETEROSCOPY/HOLMIUM LASER/STENT EXTRACTION WITH LEFT STENT PLACEMENT;  Surgeon: Franchot Gallo, MD;  Location: [facility];  Service: Urology;  Laterality: Left;    FAMILY HISTORY: Family History  Problem Relation Age of Onset  . Hypertension Mother   . Hypertension Father   . Diabetes Father      SOCIAL HISTORY: Social History   Socioeconomic History  . Marital status: Married    Spouse name: Not on file  . Number of children: 2  . Years of education: college  . Highest education level: Bachelor's degree (e.g., BA, AB, BS)  Occupational History  . Occupation: Disabled  Tobacco Use  . Smoking status: Never Smoker  . Smokeless tobacco: Never Used  Substance and Sexual Activity  . Alcohol use: Not Currently    Comment: social  . Drug use: Never  . Sexual activity: Not Currently  Other Topics Concern  . Not on file  Social History Narrative   Lives at home with his wife and son.   Right-handed.   Moderate caffeine use.   Social Determinants of Health   Financial Resource Strain: Low Risk   . Difficulty of Paying Living Expenses: Not hard at all  Food Insecurity: No Food Insecurity  . Worried About Charity fundraiser in the Last Year: Never true  . Ran Out of Food in the Last Year: Never true  Transportation Needs: No Transportation Needs  . Lack of Transportation (Medical): No  . Lack of Transportation (Non-Medical): No  Physical Activity: Insufficiently Active  . Days of Exercise per Week: 3 days  . Minutes of Exercise per Session: 30 min  Stress: Stress Concern Present  . Feeling of Stress : To some  extent  Social Connections: Unknown  . Frequency of Communication with Friends and Family: Three times a week  . Frequency of Social Gatherings with Friends and Family: Twice a week  . Attends Religious Services: Patient refused  . Active Member of Clubs or Organizations: Patient refused  . Attends Archivist Meetings: Patient refused  . Marital Status: Married  Human resources officer Violence: Not At Risk  . Fear of Current or Ex-Partner: No  . Emotionally Abused: No  . Physically Abused: No  . Sexually Abused: No     PHYSICAL EXAM   Vitals:   03/22/19 0840  BP: 110/63  Pulse: (!) 101  Temp: (!) 97.2 F (36.2 C)  SpO2: 96%    Not recorded       There is no height or weight on file to calculate BMI.  PHYSICAL EXAMNIATION:  Gen: NAD, conversant, well nourised, obese, well groomed                     Cardiovascular: Regular rate rhythm, no peripheral edema, warm, nontender. Eyes: Conjunctivae clear without exudates or hemorrhage Neck: Supple, no carotid bruits. Pulmonary: Clear to auscultation bilaterally   NEUROLOGICAL EXAM:  MENTAL STATUS: Speech:    Speech is normal; fluent and spontaneous with normal comprehension.  Cognition:     Orientation to time, place and person     Normal recent and remote memory     Normal Attention span and concentration     Normal Language, naming, repeating,spontaneous speech     Fund of knowledge   CRANIAL NERVES: CN II: Visual fields are full to confrontation.Pupils are round equal and briskly reactive to light. CN III, IV, VI: extraocular movement are normal. No ptosis. CN V: Facial sensation is intact to pinprick in all 3 divisions bilaterally. Corneal responses are intact.  CN VII: Face is symmetric with normal eye closure and smile. CN VIII: Hearing is normal to rubbing fingers CN IX, X: Palate elevates symmetrically. Phonation is normal. CN XI: Head turning and shoulder shrug are intact CN XII: Tongue is midline with normal movements and no atrophy.  MOTOR: He has mild weakness of bilateral shoulder abduction, external rotation, elbow flexion, finger extension and grip  He has no significant movement to lower extremity proximal distal muscles  REFLEXES: Reflexes are 1 and symmetric at the biceps, triceps, reactive at knees, and ankles.  SENSORY: Sensory level to T7  COORDINATION:  Normal finger-to-nose, no dysmetria  GAIT/STANCE: Gait was deferred   DIAGNOSTIC DATA (LABS, IMAGING, TESTING) - I reviewed patient records, labs, notes, testing and imaging myself where available.   ASSESSMENT AND PLAN  Victor Christensen is a 49 y.o. male    Neuromyelitis  Evidence  of cervical, and thoracic spine involvement,  Negative NMO antibodies  Continue CellCept 500 mg 2 tablets twice a day,  Leukopenia  repeat laboratory evaluations,    Marcial Pacas, M.D. Ph.D.  [facility] [address] [place], [place] [address] Ph: [phone] Fax: [phone]  CC: Referring Provider

## 2019-03-23 ENCOUNTER — Telehealth: Payer: Self-pay | Admitting: Neurology

## 2019-03-23 DIAGNOSIS — R899 Unspecified abnormal finding in specimens from other organs, systems and tissues: Secondary | ICD-10-CM

## 2019-03-23 DIAGNOSIS — G369 Acute disseminated demyelination, unspecified: Secondary | ICD-10-CM

## 2019-03-23 LAB — CBC WITH DIFFERENTIAL/PLATELET
Basophils Absolute: 0 10*3/uL (ref 0.0–0.2)
Basos: 1 %
EOS (ABSOLUTE): 0.1 10*3/uL (ref 0.0–0.4)
Eos: 3 %
Hematocrit: 42.4 % (ref 37.5–51.0)
Hemoglobin: 13.4 g/dL (ref 13.0–17.7)
Immature Grans (Abs): 0 10*3/uL (ref 0.0–0.1)
Immature Granulocytes: 1 %
Lymphocytes Absolute: 0.7 10*3/uL (ref 0.7–3.1)
Lymphs: 28 %
MCH: 24.9 pg — ABNORMAL LOW (ref 26.6–33.0)
MCHC: 31.6 g/dL (ref 31.5–35.7)
MCV: 79 fL (ref 79–97)
Monocytes Absolute: 0.5 10*3/uL (ref 0.1–0.9)
Monocytes: 18 %
Neutrophils Absolute: 1.3 10*3/uL — ABNORMAL LOW (ref 1.4–7.0)
Neutrophils: 49 %
Platelets: 261 10*3/uL (ref 150–450)
RBC: 5.38 x10E6/uL (ref 4.14–5.80)
RDW: 14.7 % (ref 11.6–15.4)
WBC: 2.6 10*3/uL — ABNORMAL LOW (ref 3.4–10.8)

## 2019-03-23 NOTE — Telephone Encounter (Signed)
I called the patient back and placed him on the schedule for labs on 04/19/19 at 9am.

## 2019-03-23 NOTE — Telephone Encounter (Signed)
I had called patient to stop the CellCept today for continued decrease of WBC, 2.6 on March 22, 2019  Repeat CBC with differentiation in 4 weeks, lab order was placed, Victor Christensen please give him a call on March 2021 to remind him to have repeat laboratory evaluation  If he truly has leukopenia due to CellCept, we will have to change to a different immunosuppressive treatment for his neuromyelitis

## 2019-03-29 ENCOUNTER — Other Ambulatory Visit: Payer: Self-pay

## 2019-03-29 ENCOUNTER — Encounter (INDEPENDENT_AMBULATORY_CARE_PROVIDER_SITE_OTHER): Payer: Medicare Other | Admitting: Ophthalmology

## 2019-03-29 DIAGNOSIS — H33303 Unspecified retinal break, bilateral: Secondary | ICD-10-CM

## 2019-03-29 DIAGNOSIS — H2513 Age-related nuclear cataract, bilateral: Secondary | ICD-10-CM

## 2019-03-29 DIAGNOSIS — H43813 Vitreous degeneration, bilateral: Secondary | ICD-10-CM | POA: Diagnosis not present

## 2019-04-07 ENCOUNTER — Other Ambulatory Visit: Payer: Self-pay

## 2019-04-07 ENCOUNTER — Ambulatory Visit: Payer: Medicare PPO | Admitting: Registered Nurse

## 2019-04-07 ENCOUNTER — Encounter: Payer: Self-pay | Admitting: Registered Nurse

## 2019-04-07 VITALS — BP 103/70 | HR 96 | Temp 97.9°F | Ht 71.0 in

## 2019-04-07 DIAGNOSIS — R21 Rash and other nonspecific skin eruption: Secondary | ICD-10-CM

## 2019-04-07 DIAGNOSIS — E119 Type 2 diabetes mellitus without complications: Secondary | ICD-10-CM

## 2019-04-07 LAB — POCT GLYCOSYLATED HEMOGLOBIN (HGB A1C): Hemoglobin A1C: 6.5 % — AB (ref 4.0–5.6)

## 2019-04-07 MED ORDER — METFORMIN HCL 500 MG PO TABS: 500.0000 mg | ORAL_TABLET | Freq: Three times a day (TID) | ORAL | 1 refills | Status: DC

## 2019-04-07 NOTE — Patient Instructions (Signed)
° ° ° °  If you have lab work done today you will be contacted with your lab results within the next 2 weeks.  If you have not heard from us then please contact us. The fastest way to get your results is to register for My Chart. ° ° °IF you received an x-ray today, you will receive an invoice from [facility]. Please contact [facility] at [phone] with questions or concerns regarding your invoice.  ° °IF you received labwork today, you will receive an invoice from [facility]. Please contact [facility] at [phone] with questions or concerns regarding your invoice.  ° °Our billing staff will not be able to assist you with questions regarding bills from these companies. ° °You will be contacted with the lab results as soon as they are available. The fastest way to get your results is to activate your My Chart account. Instructions are located on the last page of this paperwork. If you have not heard from us regarding the results in 2 weeks, please contact this office. °  ° ° ° °

## 2019-04-07 NOTE — Progress Notes (Signed)
Established Patient Office Visit  Subjective:  Patient ID: Victor Christensen, male    DOB: 06-Feb-1970  Age: 49 y.o. MRN: [id number]  CC:  Chief Complaint  Patient presents with  . Follow-up    on medical conditions no concerns at this time.    HPI Victor Christensen presents for 6 mo check on t2dm Feeling well Taking metformin 547m Po two to three times daily with meals. Reports good diet  Notes a lesion on his right eyebrow - skin is thick, itchy. Sometimes appears vesicular. No drainage. No change to vision. Not painful.  No other complaints. Follows with neurology for neuromyelitis.  Past Medical History:  Diagnosis Date  . Anxiety   . Diabetes mellitus without complication ([place]    type 2   . [place]elitis optica ([place]     Past Surgical History:  Procedure Laterality Date  . CYSTOSCOPY WITH STENT PLACEMENT Left 01/15/2018   Procedure: CYSTOSCOPY WITH STENT PLACEMENT retrograde pylegram;  Surgeon: DFranchot Gallo MD;  [facility];  Service: Urology;  Laterality: Left;  . CYSTOSCOPY/URETEROSCOPY/HOLMIUM LASER/STENT PLACEMENT Left 02/05/2018   Procedure: CYSTOSCOPY/URETEROSCOPY/HOLMIUM LASER/STENT EXTRACTION WITH LEFT STENT PLACEMENT;  Surgeon: DFranchot Gallo MD;  [facility];  Service: Urology;  Laterality: Left;    Family History  Problem Relation Age of Onset  . Hypertension Mother   . Hypertension Father   . Diabetes Father     Social History   Socioeconomic History  . Marital status: Married    Spouse name: Not on file  . Number of children: 2  . Years of education: college  . Highest education level: Bachelor's degree (e.g., BA, AB, BS)  Occupational History  . Occupation: Disabled  Tobacco Use  . Smoking status: Never Smoker  . Smokeless tobacco: Never Used  Substance and Sexual Activity  . Alcohol use: Not Currently    Comment: social  . Drug use: Never  . Sexual activity: Not Currently  Other Topics Concern  . Not on file  Social  History Narrative   Lives at home with his wife and son.   Right-handed.   Moderate caffeine use.   Social Determinants of Health   Financial Resource Strain: Low Risk   . Difficulty of Paying Living Expenses: Not hard at all  Food Insecurity: No Food Insecurity  . Worried About RCharity fundraiserin the Last Year: Never true  . Ran Out of Food in the Last Year: Never true  Transportation Needs: No Transportation Needs  . Lack of Transportation (Medical): No  . Lack of Transportation (Non-Medical): No  Physical Activity: Insufficiently Active  . Days of Exercise per Week: 3 days  . Minutes of Exercise per Session: 30 min  Stress: Stress Concern Present  . Feeling of Stress : To some extent  Social Connections: Unknown  . Frequency of Communication with Friends and Family: Three times a week  . Frequency of Social Gatherings with Friends and Family: Twice a week  . Attends Religious Services: Patient refused  . Active Member of Clubs or Organizations: Patient refused  . Attends CArchivistMeetings: Patient refused  . Marital Status: Married  IHuman resources officerViolence: Not At Risk  . Fear of Current or Ex-Partner: No  . Emotionally Abused: No  . Physically Abused: No  . Sexually Abused: No    Outpatient Medications Prior to Visit  Medication Sig Dispense Refill  . Cholecalciferol (VITAMIN D3 PO) Take 2,000 Units by mouth daily.    .Marland Kitchengabapentin (  NEURONTIN) 300 MG capsule Take 2 capsules (600 mg total) by mouth 3 (three) times daily. 180 capsule 11  . metFORMIN (GLUCOPHAGE) 500 MG tablet Take 1 tablet (500 mg total) by mouth 3 (three) times daily with meals. 270 tablet 1  . Lancets (ONETOUCH ULTRASOFT) lancets 4 each by Other route 4 (four) times daily -  with meals and at bedtime.    . mycophenolate (CELLCEPT) 500 MG tablet Take 2 tablets (1,000 mg total) by mouth 2 (two) times daily. (Patient not taking: Reported on 04/07/2019) 360 tablet 4   No facility-administered  medications prior to visit.    Allergies  Allergen Reactions  . Oxycodone-Acetaminophen Hives and Other (See Comments)    Makes him loopy     ROS Review of Systems  Constitutional: Negative.   HENT: Negative.   Eyes: Negative.   Respiratory: Negative.   Cardiovascular: Negative.   Gastrointestinal: Negative.   Endocrine: Negative.   Genitourinary: Negative.   Musculoskeletal: Negative.   Skin: Negative.   Allergic/Immunologic: Negative.   Neurological: Negative.   Hematological: Negative.   Psychiatric/Behavioral: Negative.   All other systems reviewed and are negative.     Objective:    Physical Exam  Constitutional: He is oriented to person, place, and time. He appears well-developed and well-nourished. No distress.  Cardiovascular: Normal rate and regular rhythm.  Pulmonary/Chest: Effort normal. No respiratory distress.  Neurological: He is alert and oriented to person, place, and time.  Skin: Skin is warm and dry. No rash noted. He is not diaphoretic. No erythema. No pallor.  Psychiatric: He has a normal mood and affect. His behavior is normal. Judgment and thought content normal.  Nursing note and vitals reviewed.   BP 103/70   Pulse 96   Temp 97.9 F (36.6 C) (Temporal)   Ht 5' 11"  (1.803 m)   SpO2 96%   BMI 32.78 kg/m  Wt Readings from Last 3 Encounters:  10/09/18 235 lb (106.6 kg)  02/05/18 233 lb 11 oz (106 kg)  01/14/18 235 lb (106.6 kg)     Health Maintenance Due  Topic Date Due  . PNEUMOCOCCAL POLYSACCHARIDE VACCINE AGE 23-64 HIGH RISK  08/08/1973  . FOOT EXAM  08/08/1981  . OPHTHALMOLOGY EXAM  08/08/1981  . URINE MICROALBUMIN  08/08/1981    There are no preventive care reminders to display for this patient.  Lab Results  Component Value Date   TSH 2.300 09/16/2018   Lab Results  Component Value Date   WBC 2.6 (L) 03/22/2019   HGB 13.4 03/22/2019   HCT 42.4 03/22/2019   MCV 79 03/22/2019   PLT 261 03/22/2019   Lab Results   Component Value Date   NA 138 09/16/2018   K 4.7 09/16/2018   CO2 23 09/16/2018   GLUCOSE 122 (H) 09/16/2018   BUN 13 09/16/2018   CREATININE 1.21 09/16/2018   BILITOT 0.4 09/16/2018   ALKPHOS 48 09/16/2018   AST 23 09/16/2018   ALT 26 09/16/2018   PROT 7.4 09/16/2018   ALBUMIN 4.8 09/16/2018   CALCIUM 10.0 09/16/2018   ANIONGAP 8 02/05/2018   No results found for: CHOL No results found for: HDL No results found for: LDLCALC No results found for: TRIG No results found for: CHOLHDL Lab Results  Component Value Date   HGBA1C 6.5 (A) 04/07/2019      Assessment & Plan:   Problem List Items Addressed This Visit      Endocrine   Type 2 diabetes mellitus without complication,  without long-term current use of insulin (HCC) - Primary   Relevant Medications   metFORMIN (GLUCOPHAGE) 500 MG tablet   Other Relevant Orders   POCT glycosylated hemoglobin (Hb A1C) (Completed)    Other Visit Diagnoses    Rash and nonspecific skin eruption       Relevant Orders   Ambulatory referral to Dermatology      Meds ordered this encounter  Medications  . metFORMIN (GLUCOPHAGE) 500 MG tablet    Sig: Take 1 tablet (500 mg total) by mouth 3 (three) times daily with meals.    Dispense:  270 tablet    Refill:  1    Order Specific Question:   Supervising Provider    Answer:   Forrest Moron [id number]    Follow-up: 6 mo for t2dm check  PLAN  A1c well in control at 6.5  Continue metformin and lifestyle control  Referral to derm for lesion on face  Patient encouraged to call clinic with any questions, comments, or concerns.   Maximiano Coss, NP

## 2019-04-19 ENCOUNTER — Other Ambulatory Visit (INDEPENDENT_AMBULATORY_CARE_PROVIDER_SITE_OTHER): Payer: Self-pay

## 2019-04-19 ENCOUNTER — Other Ambulatory Visit: Payer: Self-pay

## 2019-04-19 DIAGNOSIS — G369 Acute disseminated demyelination, unspecified: Secondary | ICD-10-CM

## 2019-04-19 DIAGNOSIS — R899 Unspecified abnormal finding in specimens from other organs, systems and tissues: Secondary | ICD-10-CM

## 2019-04-19 DIAGNOSIS — Z0289 Encounter for other administrative examinations: Secondary | ICD-10-CM

## 2019-04-20 ENCOUNTER — Telehealth: Payer: Self-pay | Admitting: Neurology

## 2019-04-20 DIAGNOSIS — G369 Acute disseminated demyelination, unspecified: Secondary | ICD-10-CM

## 2019-04-20 LAB — CBC WITH DIFFERENTIAL/PLATELET
Basophils Absolute: 0 10*3/uL (ref 0.0–0.2)
Basos: 1 %
EOS (ABSOLUTE): 0.1 10*3/uL (ref 0.0–0.4)
Eos: 3 %
Hematocrit: 41.8 % (ref 37.5–51.0)
Hemoglobin: 13.1 g/dL (ref 13.0–17.7)
Immature Grans (Abs): 0 10*3/uL (ref 0.0–0.1)
Immature Granulocytes: 0 %
Lymphocytes Absolute: 1 10*3/uL (ref 0.7–3.1)
Lymphs: 34 %
MCH: 25 pg — ABNORMAL LOW (ref 26.6–33.0)
MCHC: 31.3 g/dL — ABNORMAL LOW (ref 31.5–35.7)
MCV: 80 fL (ref 79–97)
Monocytes Absolute: 0.5 10*3/uL (ref 0.1–0.9)
Monocytes: 17 %
Neutrophils Absolute: 1.2 10*3/uL — ABNORMAL LOW (ref 1.4–7.0)
Neutrophils: 45 %
Platelets: 246 10*3/uL (ref 150–450)
RBC: 5.24 x10E6/uL (ref 4.14–5.80)
RDW: 14.4 % (ref 11.6–15.4)
WBC: 2.8 10*3/uL — ABNORMAL LOW (ref 3.4–10.8)

## 2019-04-20 NOTE — Telephone Encounter (Signed)
Patient has history of neuromyelitis, has been treated with CellCept 500 mg 2 tablets twice a day since October 2016  Multiple repeat laboratory evaluation in our system showed leukopenia, I have advised him to stop CellCept on March 23, 2019,  Repeat CBC with differentiation a month later on April 19, 2019 continue to show leukopenia, WBC was 2.8, which was 2.6, with evidence of mildly decreased MCH 25, MCHC 21.3, decreased absolute neutrophilia, there was no significant change compared to February while he was still taking CellCept 1000 mg twice daily  I will refer him to hematologist for evaluation, please advise him continue to hold off CellCept, and also check to make sure he has no flareup of his neurological symptoms.

## 2019-04-20 NOTE — Telephone Encounter (Signed)
He had labs collected on 04/19/19.

## 2019-04-20 NOTE — Telephone Encounter (Signed)
I spoke to the patient. States he is still doing well off Cellcept. No new neurological issues or concerns of flare up. He is agreeable to see hematology and aware to expect a call for scheduling.

## 2019-04-21 ENCOUNTER — Telehealth: Payer: Self-pay | Admitting: Hematology and Oncology

## 2019-04-21 NOTE — Telephone Encounter (Signed)
Received a new hem referral from [facility] for neuromyelitis. Pt has been cld and scheduled to see Dr. Lindi Adie on 4/12 at 1pm. Pt aware to arrive 15 minutes early.

## 2019-05-03 ENCOUNTER — Telehealth: Payer: Self-pay | Admitting: Neurology

## 2019-05-03 ENCOUNTER — Ambulatory Visit
Admission: RE | Admit: 2019-05-03 | Discharge: 2019-05-03 | Disposition: A | Discharge status: Home / Self Care | Payer: Medicare PPO | Source: Ambulatory Visit | Attending: Dermatology | Admitting: Dermatology

## 2019-05-03 ENCOUNTER — Other Ambulatory Visit: Payer: Self-pay | Admitting: Dermatology

## 2019-05-03 DIAGNOSIS — D869 Sarcoidosis, unspecified: Secondary | ICD-10-CM

## 2019-05-03 MED ORDER — GABAPENTIN 300 MG PO CAPS: 900.0000 mg | ORAL_CAPSULE | Freq: Three times a day (TID) | ORAL | 11 refills | Status: DC

## 2019-05-03 NOTE — Telephone Encounter (Signed)
I called the patient back and he is agreeable to the gabapentin increase. He verbalized understanding to call us back with an update. He has a pending appt to see the hematologist on 05/10/19.

## 2019-05-03 NOTE — Telephone Encounter (Signed)
Pt called and LVM stating that he was informed that if he started feeling any tingling or numbness in his body to reach out to the provider or RN and he would like to speak to the RN. Please advise.

## 2019-05-03 NOTE — Telephone Encounter (Signed)
For the last two days, he has been having numbness/tingling in his left foot radiating up to his knee. He is also having the same feelings in his bilateral hands. Symptoms are worse at night and waking him up from sleep. He is currently taking gabapentin 380m, two capsules TID. He is concerned he may need a med change or this may represent the start of a flare-up.

## 2019-05-03 NOTE — Addendum Note (Signed)
Addended by: Desmond Lope on: 05/03/2019 05:35 PM   Modules accepted: Orders

## 2019-05-03 NOTE — Telephone Encounter (Signed)
CellCept was stopped due to leukopenia, please check on his referral to hematologist  With his previous history of neuromyelitis, neuropathic symptoms can varies, he can take higher dose of gabapentin 300 mg 3 tablets 3 times a day, call us back in 1 to 2 weeks for progress report,

## 2019-05-10 ENCOUNTER — Inpatient Hospital Stay: Payer: Medicare PPO | Admitting: Hematology and Oncology

## 2019-05-10 ENCOUNTER — Telehealth: Payer: Self-pay | Admitting: Hematology and Oncology

## 2019-05-10 NOTE — Telephone Encounter (Signed)
Mr. Pinkerton has been cld and rescheduled to see Dr. Lindi Adie on 4/19 at 945am.

## 2019-05-12 ENCOUNTER — Telehealth: Payer: Self-pay | Admitting: Neurology

## 2019-05-12 NOTE — Telephone Encounter (Signed)
I called the patient and he has been rescheduled to see Dr. Krista Blue on 07/19/19.

## 2019-05-12 NOTE — Telephone Encounter (Signed)
I was able to talk with his dermatologist Dr. 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 Elvera Lennox is considering potential topical steroid treatment, the next line will be hydroxychloroquine, even CellCept,  I have updated his neurological information to Dr. Elvera Lennox, most recent MRI of brain was normal, cervical, thoracic spine with without contrast showed generalized cervical and upper thoracic thinning with patchy signal from the gliosis, correlating with remote insult in this patient, no swelling or enhancement to suggest active inflammation.  Patient was found to have decreased WBC, 2.8, was referred to hematologist, CellCept was stopped since end of March 2021  Hematology evaluation is pending on May 17, 2019  Please change his follow-up appointment with me around June 2021

## 2019-05-16 NOTE — Progress Notes (Signed)
[name]  Patient Care Team: Maximiano Coss, NP as PCP - General (Adult Health Nurse Practitioner)  CHIEF COMPLAINTS/PURPOSE OF CONSULTATION:  Newly diagnosed leukopenia, history of neuromyelitis  HISTORY OF PRESENTING ILLNESS:  Victor Christensen 49 y.o. male is here because of recent diagnosis of neuromyelitis and recent leukopenia. He is referred by [facility] where she is under the care of Dr. Krista Blue. He was diagnosed with neuromyelitis in 2012 after presenting with extremity numbness, gait abnormality, and incontinence. MRI showed cervical and thoracic spine lesions. He has been treated previously with rituximab and plasma exchange and most recently on CellCept 536m BID starting 2014. Labs on 03/22/19 showed WBC 2.6, ANC 1.3 and he was advised to hold CellCept. Labs on 04/19/19 showed no improvement with WBC 2.8, ANC 1.2. He has continued to hold CellCept. He presents to the clinic today for initial evaluation and discussion of treatment options.   He is wheelchair-bound without any lower extremity strength.  He has very delayed tactile sensation.  He is able to have control of his bowels and bladder.  He has not had any recent infections or illnesses.  He gets intense pain when he has relapses of neuromyelitis.  The pain could be so severe that even to touch would be in excruciating discomfort.  [facility] and now under the care of Dr. YKrista Blue  I reviewed his records extensively and collaborated the history with the patient.  MEDICAL HISTORY:  Past Medical History:  Diagnosis Date  . Anxiety   . Diabetes mellitus without complication ([place]    type 2   . 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 CYSTOSCOPY WITH STENT PLACEMENT Left 01/15/2018   Procedure: CYSTOSCOPY WITH STENT PLACEMENT retrograde pylegram;  Surgeon: DFranchot Gallo MD;  Location: [facility];   Service: Urology;  Laterality: Left;  . CYSTOSCOPY/URETEROSCOPY/HOLMIUM LASER/STENT PLACEMENT Left 02/05/2018   Procedure: CYSTOSCOPY/URETEROSCOPY/HOLMIUM LASER/STENT EXTRACTION WITH LEFT STENT PLACEMENT;  Surgeon: DFranchot Gallo MD;  Location: [facility];  Service: Urology;  Laterality: Left;    SOCIAL HISTORY: Social History   Socioeconomic History  . Marital status: Married    Spouse name: Not on file  . Number of children: 2  . Years of education: college  . Highest education level: Bachelor's degree (e.g., BA, AB, BS)  Occupational History  . Occupation: Disabled  Tobacco Use  . Smoking status: Never Smoker  . Smokeless tobacco: Never Used  Substance and Sexual Activity  . Alcohol use: Not Currently    Comment: social  . Drug use: Never  . Sexual activity: Not Currently  Other Topics Concern  . Not on file  Social History Narrative   Lives at home with his wife and son.   Right-handed.   Moderate caffeine use.   Social Determinants of Health   Financial Resource Strain: Low Risk   . Difficulty of Paying Living Expenses: Not hard at all  Food Insecurity: No Food Insecurity  . Worried About RCharity fundraiserin the Last Year: Never true  . Ran Out of Food in the Last Year: Never true  Transportation Needs: No Transportation Needs  . Lack of Transportation (Medical): No  . Lack of Transportation (Non-Medical): No  Physical Activity: Insufficiently Active  . Days of Exercise per Week: 3 days  . Minutes of Exercise per Session: 30 min  Stress: Stress Concern Present  . Feeling of  Stress : To some extent  Social Connections: Unknown  . Frequency of Communication with Friends and Family: Three times a week  . Frequency of Social Gatherings with Friends and Family: Twice a week  . Attends Religious Services: Patient refused  . Active Member of Clubs or Organizations: Patient refused  . Attends Archivist Meetings: Patient refused  . Marital Status: Married    Human resources officer Violence: Not At Risk  . Fear of Current or Ex-Partner: No  . Emotionally Abused: No  . Physically Abused: No  . Sexually Abused: No    FAMILY HISTORY: Family History  Problem Relation Age of Onset  . Hypertension Mother   . Hypertension Father   . Diabetes Father     ALLERGIES:  is allergic to oxycodone-acetaminophen.  MEDICATIONS:  Current Outpatient Medications  Medication Sig Dispense Refill  . Cholecalciferol (VITAMIN D3 PO) Take 2,000 Units by mouth daily.    Marland Kitchen gabapentin (NEURONTIN) 300 MG capsule Take 3 capsules (900 mg total) by mouth 3 (three) times daily. 270 capsule 11  . Lancets (ONETOUCH ULTRASOFT) lancets 4 each by Other route 4 (four) times daily -  with meals and at bedtime.    . metFORMIN (GLUCOPHAGE) 500 MG tablet Take 1 tablet (500 mg total) by mouth 3 (three) times daily with meals. 270 tablet 1  . mycophenolate (CELLCEPT) 500 MG tablet Take 2 tablets (1,000 mg total) by mouth 2 (two) times daily. (Patient not taking: Reported on 04/07/2019) 360 tablet 4   No current facility-administered medications for this visit.    REVIEW OF SYSTEMS:   Constitutional: Denies fevers, chills or abnormal night sweats Eyes: Denies blurriness of vision, double vision or watery eyes Ears, nose, mouth, throat, and face: Denies mucositis or sore throat Respiratory: Denies cough, dyspnea or wheezes Cardiovascular: Denies palpitation, chest discomfort or lower extremity swelling Gastrointestinal:  Denies nausea, heartburn or change in bowel habits Skin: Denies abnormal skin rashes Lymphatics: Denies new lymphadenopathy or easy bruising Neurological: Wheelchair-bound, lower extremity weakness and decreased sensation. Behavioral/Psych: Mood is stable, no new changes All other systems were reviewed with the patient and are negative.  PHYSICAL EXAMINATION: ECOG PERFORMANCE STATUS: 1 - Symptomatic but completely ambulatory  Vitals:   05/17/19 1011  BP: 111/83   Pulse: 82  Resp: 18  Temp: 97.8 F (36.6 C)  SpO2: 100%   Filed Weights    GENERAL:alert, no distress and comfortable SKIN: skin color, texture, turgor are normal, no rashes or significant lesions EYES: normal, conjunctiva are pink and non-injected, sclera clear OROPHARYNX:no exudate, no erythema and lips, buccal mucosa, and tongue normal  NECK: supple, thyroid normal size, non-tender, without nodularity LYMPH:  no palpable lymphadenopathy in the cervical, axillary or inguinal LUNGS: clear to auscultation and percussion with normal breathing effort HEART: regular rate & rhythm and no murmurs and no lower extremity edema ABDOMEN:abdomen soft, non-tender and normal bowel sounds Musculoskeletal:no cyanosis of digits and no clubbing  PSYCH: alert & oriented x 3 with fluent speech NEURO: Loss of lower extremity strength, uses wheelchair.  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 He moved to [place] for his wife.  He has a 33-year-old son.  Differential diagnosis: 1. Infections: Especially viruses like HIV 2. inflammation/autoimmune causes: Most likely related to underlying autoimmune disorders 3. Nutritional causes but D-03 or folic acid deficiency 4. Medication induced: CellCept has been known to cause protracted cytopenias. 5. Bone marrow disorders: Given the normal hemoglobin and platelet counts, myelodysplastic syndrome or other bone marrow disorders are less likely.  Workup today: 1. CBC  with differential with smear 2. U-13 and folic acid levels: Pernicious anemia is a suspicion because of his autoimmune disease.  We will check antiintrinsic factor and antiparietal cell antibodies. 3. ANA: If he does have lupus then resuming CellCept may help with the neutrophil count.  Virtual visit and follow-up in 2 days.    All questions were answered. The patient knows to call the clinic with any problems, questions or concerns.   Rulon Eisenmenger, MD, MPH 05/17/2019    I, Molly Dorshimer, am acting as scribe for Nicholas Lose, MD.  I have reviewed the above documentation for accuracy and completeness, and I agree with the above.

## 2019-05-17 ENCOUNTER — Inpatient Hospital Stay: Payer: Medicare PPO

## 2019-05-17 ENCOUNTER — Telehealth: Payer: Self-pay | Admitting: Hematology and Oncology

## 2019-05-17 ENCOUNTER — Inpatient Hospital Stay: Payer: Medicare PPO | Attending: Hematology and Oncology | Admitting: Hematology and Oncology

## 2019-05-17 ENCOUNTER — Other Ambulatory Visit: Payer: Self-pay

## 2019-05-17 DIAGNOSIS — G369 Acute disseminated demyelination, unspecified: Secondary | ICD-10-CM

## 2019-05-17 DIAGNOSIS — Z79899 Other long term (current) drug therapy: Secondary | ICD-10-CM | POA: Diagnosis not present

## 2019-05-17 DIAGNOSIS — F419 Anxiety disorder, unspecified: Secondary | ICD-10-CM | POA: Diagnosis not present

## 2019-05-17 DIAGNOSIS — Z833 Family history of diabetes mellitus: Secondary | ICD-10-CM | POA: Diagnosis not present

## 2019-05-17 DIAGNOSIS — D709 Neutropenia, unspecified: Secondary | ICD-10-CM

## 2019-05-17 DIAGNOSIS — Z7984 Long term (current) use of oral hypoglycemic drugs: Secondary | ICD-10-CM | POA: Diagnosis not present

## 2019-05-17 DIAGNOSIS — E119 Type 2 diabetes mellitus without complications: Secondary | ICD-10-CM | POA: Diagnosis not present

## 2019-05-17 DIAGNOSIS — Z8249 Family history of ischemic heart disease and other diseases of the circulatory system: Secondary | ICD-10-CM

## 2019-05-17 DIAGNOSIS — D72819 Decreased white blood cell count, unspecified: Secondary | ICD-10-CM

## 2019-05-17 LAB — CBC WITH DIFFERENTIAL (CANCER CENTER ONLY)
Abs Immature Granulocytes: 0.01 10*3/uL (ref 0.00–0.07)
Basophils Absolute: 0 10*3/uL (ref 0.0–0.1)
Basophils Relative: 1 %
Eosinophils Absolute: 0.1 10*3/uL (ref 0.0–0.5)
Eosinophils Relative: 4 %
HCT: 43.5 % (ref 39.0–52.0)
Hemoglobin: 13.4 g/dL (ref 13.0–17.0)
Immature Granulocytes: 0 %
Lymphocytes Relative: 32 %
Lymphs Abs: 1 10*3/uL (ref 0.7–4.0)
MCH: 24.8 pg — ABNORMAL LOW (ref 26.0–34.0)
MCHC: 30.8 g/dL (ref 30.0–36.0)
MCV: 80.4 fL (ref 80.0–100.0)
Monocytes Absolute: 0.4 10*3/uL (ref 0.1–1.0)
Monocytes Relative: 14 %
Neutro Abs: 1.5 10*3/uL — ABNORMAL LOW (ref 1.7–7.7)
Neutrophils Relative %: 49 %
Platelet Count: 242 10*3/uL (ref 150–400)
RBC: 5.41 MIL/uL (ref 4.22–5.81)
RDW: 14.4 % (ref 11.5–15.5)
WBC Count: 3 10*3/uL — ABNORMAL LOW (ref 4.0–10.5)
nRBC: 0 % (ref 0.0–0.2)

## 2019-05-17 LAB — LACTATE DEHYDROGENASE: LDH: 150 U/L (ref 98–192)

## 2019-05-17 LAB — VITAMIN B12: Vitamin B-12: 181 pg/mL (ref 180–914)

## 2019-05-17 LAB — FOLATE: Folate: 6.1 ng/mL (ref >5.9)

## 2019-05-17 NOTE — Assessment & Plan Note (Signed)
Lab review: WBC 2.8, ANC 1.2 Neutropenia started 02/05/2018.  Differential diagnosis: 1. Infections: Especially viruses like HIV 2. inflammation/autoimmune causes: Most likely related to underlying autoimmune disorders 3. Nutritional causes but Q-28 or folic acid deficiency 4. Medication induced: CellCept has been known to cause protracted cytopenias. 5. Bone marrow disorders: Given the normal hemoglobin and platelet counts, myelodysplastic syndrome or other bone marrow disorders are less likely.  Workup today: 1. CBC with differential with smear 2. M-38 and folic acid levels 3. ANA 4. Flow cytometry  Return to clinic in 2 weeks to discuss the results.

## 2019-05-17 NOTE — Telephone Encounter (Signed)
Scheduled per 04/19 los, patient has been called and notified.

## 2019-05-18 LAB — ANTINUCLEAR ANTIBODIES, IFA: ANA Ab, IFA: NEGATIVE

## 2019-05-18 LAB — INTRINSIC FACTOR ANTIBODIES: Intrinsic Factor: 0.9 AU/mL (ref 0.0–1.1)

## 2019-05-18 NOTE — Progress Notes (Signed)
  HEMATOLOGY-ONCOLOGY MYCHART VIDEO VISIT PROGRESS NOTE  I connected with [name] on 05/19/2019 at  2:45 PM EDT by MyChart video conference and verified that I am speaking with the correct person using two identifiers.  I discussed the limitations, risks, security and privacy concerns of performing an evaluation and management service by MyChart and the availability of in person appointments.  I also discussed with the patient that there may be a patient responsible charge related to this service. The patient expressed understanding and agreed to proceed.  Patient's Location: Home Physician Location: Clinic  CHIEF COMPLIANT: Follow-up of leukopenia to review labs  INTERVAL HISTORY: Victor Christensen is a 49 y.o. male with above-mentioned history of neuromyelitisand leukopenia. Labs on 05/17/18 showed: WBC 3.0, ANC 1.5, ANA negative, B-12 181, LDH 150, folate 6.1. He presents to the clinic today to review his labs.   Observations/Objective:  There were no vitals filed for this visit. There is no height or weight on file to calculate BMI.  I have reviewed the data as listed CMP Latest Ref Rng & Units 09/16/2018 02/05/2018 01/17/2018  Glucose 65 - 99 mg/dL 122(H) 113(H) 123(H)  BUN 6 - 24 mg/dL 13 12 13   Creatinine 0.76 - 1.27 mg/dL 1.21 1.13 1.15  Sodium 134 - 144 mmol/L 138 136 136  Potassium 3.5 - 5.2 mmol/L 4.7 4.3 4.0  Chloride 96 - 106 mmol/L 100 104 100  CO2 20 - 29 mmol/L 23 24 24   Calcium 8.7 - 10.2 mg/dL 10.0 9.2 8.6(L)  Total Protein 6.0 - 8.5 g/dL 7.4 - -  Total Bilirubin 0.0 - 1.2 mg/dL 0.4 - -  Alkaline Phos 39 - 117 IU/L 48 - -  AST 0 - 40 IU/L 23 - -  ALT 0 - 44 IU/L 26 - -    Lab Results  Component Value Date   WBC 3.0 (L) 05/17/2019   HGB 13.4 05/17/2019   HCT 43.5 05/17/2019   MCV 80.4 05/17/2019   PLT 242 05/17/2019   NEUTROABS 1.5 (L) 05/17/2019      Assessment Plan:  Neutropenia ([place]) Neutropenia started 02/05/2018.  He was previously in [place] and moved  to [place] for his wife. Lab review: 05/17/2019: WBC 3, hemoglobin 13.4, ANC 1.5 (it was 1.2 on 04/19/2019) B12: 181 ANA: Negative Folic acid: 6.1 LDH: 875  Based on these lab values recommended vitamin B12 supplementation. I believe he can resume CellCept if it is necessary. I sent a message to Dr. Krista Blue with this information.  We can watch and monitor his counts every 3 months.     I discussed the assessment and treatment plan with the patient. The patient was provided an opportunity to ask questions and all were answered. The patient agreed with the plan and demonstrated an understanding of the instructions. The patient was advised to call back or seek an in-person evaluation if the symptoms worsen or if the condition fails to improve as anticipated.   I provided 20 minutes of face-to-face MyChart video visit time during this encounter.    Rulon Eisenmenger, MD 05/19/2019   I, Molly Dorshimer, am acting as scribe for Nicholas Lose, MD.  I have reviewed the above documentation for accuracy and completeness, and I agree with the above.

## 2019-05-19 ENCOUNTER — Telehealth: Payer: Self-pay | Admitting: Hematology and Oncology

## 2019-05-19 ENCOUNTER — Inpatient Hospital Stay (HOSPITAL_BASED_OUTPATIENT_CLINIC_OR_DEPARTMENT_OTHER): Payer: Medicare PPO | Admitting: Hematology and Oncology

## 2019-05-19 DIAGNOSIS — D709 Neutropenia, unspecified: Secondary | ICD-10-CM | POA: Diagnosis not present

## 2019-05-19 LAB — ANTI-PARIETAL ANTIBODY: Parietal Cell Antibody-IgG: 1 Units (ref 0.0–20.0)

## 2019-05-19 NOTE — Telephone Encounter (Signed)
Scheduled per 04/21 los, patient has been called and notified.

## 2019-05-19 NOTE — Assessment & Plan Note (Signed)
Neutropenia started 02/05/2018.  He was previously in [place] and moved to [place] for his wife. Lab review: 05/17/2019: WBC 3, hemoglobin 13.4, ANC 1.5 (it was 1.2 on 04/19/2019) B12: 181 ANA: Negative Folic acid: 6.1 LDH: 224  Based on these lab values recommended vitamin B12 supplementation. I believe he can resume CellCept if it is necessary. We can watch and monitor his counts every 3 months.

## 2019-07-05 ENCOUNTER — Ambulatory Visit: Payer: Medicare PPO | Admitting: Registered Nurse

## 2019-07-05 VITALS — BP 118/83 | Ht 71.0 in | Wt 235.0 lb

## 2019-07-05 DIAGNOSIS — Z0000 Encounter for general adult medical examination without abnormal findings: Secondary | ICD-10-CM

## 2019-07-05 NOTE — Progress Notes (Signed)
Presents today for [employer] Visit   Date of last exam: 04/07/2019  Interpreter used for this visit?  No  I connected with  [name] on 07/05/19 by a telephone  and verified that I am speaking with the correct person using two identifiers.   I discussed the limitations of evaluation and management by telemedicine. The patient expressed understanding and agreed to proceed.    Patient Care Team: Maximiano Coss, NP as PCP - General (Adult Health Nurse Practitioner)   Other items to address today:   Discussed Eye/Dental Discussed immunizations Follow up scheduled 9-8 @ 12:50     ADVANCE DIRECTIVES: Discussed: yrs On File: No Materials Provided: no  Immunization status:  Immunization History  Administered Date(s) Administered  . PFIZER SARS-COV-2 Vaccination 04/29/2019, 05/20/2019     Health Maintenance Due  Topic Date Due  . PNEUMOCOCCAL POLYSACCHARIDE VACCINE AGE 68-64 HIGH RISK  Never done  . FOOT EXAM  Never done  . OPHTHALMOLOGY EXAM  Never done  . URINE MICROALBUMIN  Never done     Functional Status Survey: Is the patient deaf or have difficulty hearing?: No Does the patient have difficulty seeing, even when wearing glasses/contacts?: No Does the patient have difficulty concentrating, remembering, or making decisions?: No Does the patient have difficulty walking or climbing stairs?: Yes Does the patient have difficulty dressing or bathing?: No Does the patient have difficulty doing errands alone such as visiting a doctor's office or shopping?: No   6CIT Screen 07/05/2019  What Year? 0 points  What month? 0 points  What time? 0 points  Count back from 20 0 points  Months in reverse 0 points  Repeat phrase 0 points  Total Score 0         Home Environment:   Lives one story home  Yes trouble climbing stairs (wheelchair) No scattered rugs Yes shower seat Adequate lighting/ no clutter   Patient Active Problem List   Diagnosis Date Noted  . Neutropenia ([place]) 05/17/2019  . Abnormal laboratory test 03/22/2019  . Type 2 diabetes mellitus without complication, without long-term current use of insulin ([place]) 10/09/2018  . Neuromyelitis ([place]) 09/16/2018  . Ureteral calculus, left 01/15/2018  . Acute kidney injury ([place]) 01/16/2016  . Bilateral lower extremity edema 08/28/2015  . ED (erectile dysfunction) 06/01/2013  . 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 Lumbar spondylosis with myelopathy 05/20/2013  . 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 Neurogenic bladder 03/28/2011     Past Medical History:  Diagnosis Date  . Anxiety   . Diabetes mellitus without complication ([place])    type 2   . 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 CYSTOSCOPY WITH STENT PLACEMENT Left 01/15/2018   Procedure: CYSTOSCOPY WITH STENT PLACEMENT retrograde pylegram;  Surgeon: Franchot Gallo, MD;  [facility];  Service: Urology;  Laterality: Left;  . CYSTOSCOPY/URETEROSCOPY/HOLMIUM LASER/STENT PLACEMENT Left 02/05/2018   Procedure: CYSTOSCOPY/URETEROSCOPY/HOLMIUM LASER/STENT EXTRACTION WITH LEFT STENT PLACEMENT;  Surgeon: Franchot Gallo, MD;  [facility];  Service: Urology;  Laterality: Left;     Family History  Problem Relation Age of Onset  . Hypertension Mother   . Hypertension Father   . Diabetes Father      Social History   Socioeconomic History  . Marital status: Married    Spouse name: Not on file  . Number of children: 2  . Years of education: college  . Highest education level: Bachelor's degree (e.g.,  BA, AB, BS)  Occupational History  . Occupation: Disabled  Tobacco Use  . Smoking status: Never Smoker  . Smokeless tobacco: Never Used  Substance and Sexual Activity  . Alcohol use: Not Currently    Comment: social  . Drug use: Never  . Sexual activity: Not Currently  Other Topics Concern  . Not on file  Social History Narrative   Lives at home  with his wife and son.   Right-handed.   Moderate caffeine use.   Social Determinants of Health   Financial Resource Strain: Low Risk   . Difficulty of Paying Living Expenses: Not hard at all  Food Insecurity: No Food Insecurity  . Worried About Charity fundraiser in the Last Year: Never true  . Ran Out of Food in the Last Year: Never true  Transportation Needs: No Transportation Needs  . Lack of Transportation (Medical): No  . Lack of Transportation (Non-Medical): No  Physical Activity: Insufficiently Active  . Days of Exercise per Week: 3 days  . Minutes of Exercise per Session: 30 min  Stress: Stress Concern Present  . Feeling of Stress : To some extent  Social Connections: Unknown  . Frequency of Communication with Friends and Family: Three times a week  . Frequency of Social Gatherings with Friends and Family: Twice a week  . Attends Religious Services: Patient refused  . Active Member of Clubs or Organizations: Patient refused  . Attends Archivist Meetings: Patient refused  . Marital Status: Married  Human resources officer Violence: Not At Risk  . Fear of Current or Ex-Partner: No  . Emotionally Abused: No  . Physically Abused: No  . Sexually Abused: No     Allergies  Allergen Reactions  . Oxycodone-Acetaminophen Hives and Other (See Comments)    Makes him loopy      Prior to Admission medications   Medication Sig Start Date End Date Taking? Authorizing Provider  Cholecalciferol (VITAMIN D3 PO) Take 2,000 Units by mouth daily.   Yes [provider]  gabapentin (NEURONTIN) 300 MG capsule Take 3 capsules (900 mg total) by mouth 3 (three) times daily. 05/03/19  Yes Marcial Pacas, MD  Lancets [facility] ULTRASOFT) lancets 4 each by Other route 4 (four) times daily -  with meals and at bedtime. 09/16/15  Yes [provider]  metFORMIN (GLUCOPHAGE) 500 MG tablet Take 1 tablet (500 mg total) by mouth 3 (three) times daily with meals. 04/07/19  Yes Maximiano Coss, NP  mycophenolate (CELLCEPT) 500 MG tablet Take 2 tablets (1,000 mg total) by mouth 2 (two) times daily. Patient not taking: Reported on 04/07/2019 09/16/18   Marcial Pacas, MD     Depression screen [facility] 2/9 07/05/2019 04/07/2019 10/09/2018  Decreased Interest 0 0 0  Down, Depressed, Hopeless 0 0 0  PHQ - 2 Score 0 0 0     Fall Risk  07/05/2019 04/07/2019 10/09/2018  Falls in the past year? 0 0 0  Number falls in past yr: 0 0 -  Injury with Fall? 0 0 0  Follow up Falls evaluation completed;Education provided Falls evaluation completed -      PHYSICAL EXAM: BP 118/83 Comment: taken from previous visit  Ht 5' 11"  (1.803 m)   Wt 235 lb (106.6 kg)   BMI 32.78 kg/m    Wt Readings from Last 3 Encounters:  07/05/19 235 lb (106.6 kg)  10/09/18 235 lb (106.6 kg)  02/05/18 233 lb 11 oz (106 kg)  Education/Counseling provided regarding diet and exercise, prevention of chronic diseases, smoking/tobacco cessation, if applicable, and reviewed "Covered Medicare Preventive Services."

## 2019-07-05 NOTE — Patient Instructions (Addendum)
Thank you for taking time to come for your Medicare Wellness Visit. I appreciate your ongoing commitment to your health goals. Please review the following plan we discussed and let me know if I can assist you in the future.  Khamil Lamica LPN  Preventive Care 40-48 Years Old, Male Preventive care refers to lifestyle choices and visits with your health care provider that can promote health and wellness. This includes:  A yearly physical exam. This is also called an annual well check.  Regular dental and eye exams.  Immunizations.  Screening for certain conditions.  Healthy lifestyle choices, such as eating a healthy diet, getting regular exercise, not using drugs or products that contain nicotine and tobacco, and limiting alcohol use. What can I expect for my preventive care visit? Physical exam Your health care provider will check:  Height and weight. These may be used to calculate body mass index (BMI), which is a measurement that tells if you are at a healthy weight.  Heart rate and blood pressure.  Your skin for abnormal spots. Counseling Your health care provider may ask you questions about:  Alcohol, tobacco, and drug use.  Emotional well-being.  Home and relationship well-being.  Sexual activity.  Eating habits.  Work and work environment. What immunizations do I need?  Influenza (flu) vaccine  This is recommended every year. Tetanus, diphtheria, and pertussis (Tdap) vaccine  You may need a Td booster every 10 years. Varicella (chickenpox) vaccine  You may need this vaccine if you have not already been vaccinated. Zoster (shingles) vaccine  You may need this after age 48. Measles, mumps, and rubella (MMR) vaccine  You may need at least one dose of MMR if you were born in 1957 or later. You may also need a second dose. Pneumococcal conjugate (PCV13) vaccine  You may need this if you have certain conditions and were not previously vaccinated. Pneumococcal  polysaccharide (PPSV23) vaccine  You may need one or two doses if you smoke cigarettes or if you have certain conditions. Meningococcal conjugate (MenACWY) vaccine  You may need this if you have certain conditions. Hepatitis A vaccine  You may need this if you have certain conditions or if you travel or work in places where you may be exposed to hepatitis A. Hepatitis B vaccine  You may need this if you have certain conditions or if you travel or work in places where you may be exposed to hepatitis B. Haemophilus influenzae type b (Hib) vaccine  You may need this if you have certain risk factors. Human papillomavirus (HPV) vaccine  If recommended by your health care provider, you may need three doses over 6 months. You may receive vaccines as individual doses or as more than one vaccine together in one shot (combination vaccines). Talk with your health care provider about the risks and benefits of combination vaccines. What tests do I need? Blood tests  Lipid and cholesterol levels. These may be checked every 5 years, or more frequently if you are over 48 years old.  Hepatitis C test.  Hepatitis B test. Screening  Lung cancer screening. You may have this screening every year starting at age 48 if you have a 30-pack-year history of smoking and currently smoke or have quit within the past 15 years.  Prostate cancer screening. Recommendations will vary depending on your family history and other risks.  Colorectal cancer screening. All adults should have this screening starting at age 48 and continuing until age 48. Your health care provider may   recommend screening at age 45 if you are at increased risk. You will have tests every 1-10 years, depending on your results and the type of screening test.  Diabetes screening. This is done by checking your blood sugar (glucose) after you have not eaten for a while (fasting). You may have this done every 1-3 years.  Sexually transmitted  disease (STD) testing. Follow these instructions at home: Eating and drinking  Eat a diet that includes fresh fruits and vegetables, whole grains, lean protein, and low-fat dairy products.  Take vitamin and mineral supplements as recommended by your health care provider.  Do not drink alcohol if your health care provider tells you not to drink.  If you drink alcohol: ? Limit how much you have to 0-2 drinks a day. ? Be aware of how much alcohol is in your drink. In the [place] one drink equals one 12 oz bottle of beer (355 mL), one 5 oz glass of wine (148 mL), or one 1 oz glass of hard liquor (44 mL). Lifestyle  Take daily care of your teeth and gums.  Stay active. Exercise for at least 30 minutes on 5 or more days each week.  Do not use any products that contain nicotine or tobacco, such as cigarettes, e-cigarettes, and chewing tobacco. If you need help quitting, ask your health care provider.  If you are sexually active, practice safe sex. Use a condom or other form of protection to prevent STIs (sexually transmitted infections).  Talk with your health care provider about taking a low-dose aspirin every day starting at age 48. What's next?  Go to your health care provider once a year for a well check visit.  Ask your health care provider how often you should have your eyes and teeth checked.  Stay up to date on all vaccines. This information is not intended to replace advice given to you by your health care provider. Make sure you discuss any questions you have with your health care provider. Document Revised: 01/08/2018 Document Reviewed: 01/08/2018 Elsevier Patient Education  2020 Elsevier Inc.  

## 2019-07-19 ENCOUNTER — Ambulatory Visit: Payer: Medicare PPO | Admitting: Neurology

## 2019-07-19 ENCOUNTER — Encounter: Payer: Self-pay | Admitting: Neurology

## 2019-07-19 ENCOUNTER — Other Ambulatory Visit: Payer: Self-pay

## 2019-07-19 VITALS — BP 119/79 | HR 80

## 2019-07-19 DIAGNOSIS — E538 Deficiency of other specified B group vitamins: Secondary | ICD-10-CM | POA: Insufficient documentation

## 2019-07-19 DIAGNOSIS — G369 Acute disseminated demyelination, unspecified: Secondary | ICD-10-CM | POA: Diagnosis not present

## 2019-07-19 DIAGNOSIS — R899 Unspecified abnormal finding in specimens from other organs, systems and tissues: Secondary | ICD-10-CM

## 2019-07-19 DIAGNOSIS — L989 Disorder of the skin and subcutaneous tissue, unspecified: Secondary | ICD-10-CM | POA: Diagnosis not present

## 2019-07-19 NOTE — Progress Notes (Signed)
PATIENT: Victor Christensen DOB: [birth year]  Chief Complaint  Patient presents with  . Neuromyelitis    He is here for routine follow up. Feels he is doing well off CellCept. No new concerns today.     HISTORICAL  Victor Christensen is a 49 year old male, accompanied by his wife [name], seen in request by previous neurologist Dr. 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  I have reviewed and summarized the referring note from the referring physician.  He had a past medical history of diabetes since 2001, taking Metformin  In 2012, he presented with subacute onset of numbness upper and lower extremity, gait abnormality, incontinence, there was no visual loss  MRI of brain was negative, cervical spine with and without contrast showed contrast-enhancing cervical lesions, thoracic spine also showed contrast-enhancing upper cord edema,  CSF showed WBC of 215, protein of 258, 2 oligoclonal banding, IgG index was mildly elevated 0.7, serum NML antibody was negative, visual evoked potential was negative  He was diagnosed with neuromyelitis, was treated with rituximab 1000 mg IV on June 2012, August 07, 2010, January 2013, August 2013, February 2014, with treatment, he was able to regain some function, able to ambulate with walker, upper extremity strength mostly recovered.  While taking rituximab infusion, he had a few flareups, presented with worsening bilateral lower extremity weakness, sensory changes, eventually lost mobility in April 2014, become wheelchair-bound, he was [facility] in 2014, was put on CellCept 500 mg 2 tablets twice a day ever since  He had another bouts of flareup in 2016, presented with worsening lower extremity neuropathic pain, He was treated with IV Solu-Medrol in October 2016, followed by plasma exchange in October 2016, with significant improvement of his lower extremity neuropathic pain  He has been on  stable dose of CellCept 500 mg 2 tablets twice a day since.  He spent most of the day in wheelchair, has fingertips paresthesia, mild hand grip weakness, but overall upper extremity function well, able to transfer himself in and out of wheelchair, driving with a modified car, keep a bowel regimen, wear depends.  UPDATE Mar 22 2019: Record [facility] on October 22, 2014, MRI of cervical spine with and without contrast, there is patchy signal abnormality, extends from the cervical medullary junction eccentric towards the left, along the central and dorsal aspect of the cord C2, 3, right dorsal cord C6, and patchy signal abnormality at C7, upper thoracic cord, on the sagittal STIR imagings or thoracic spinal cord, there are diffuse patchy signal abnormality in the cord, and mild diffuse volume loss, there is irregular linear band of enhancement cervical spinal cord extending from posterior cervical medullary junction to the C6 level, also at T1-T2, T2-T3 level finding appear to progress since most recent MRI cervical spine in 2015, but similar to more remote cervical spine imaging in 2014, no additional enhancing abnormality identified in the remainder of the thoracic spinal cord,Tiny disc protrusion at T3-T4,  I personally reviewed MRI of spines with without contrast in September 2020 There is generalized atrophy of cervical and upper thoracic spine, with patchy signal changes, correlate with remote insult in this patient, Cord: Diffusely small cord measuring only 3-4 mm anterior to posterior at the cervicothoracic junction. Cord also has heterogeneous signal on T2 and STIR imaging, attributed to gliosis from prior demyelination. No expansion/swelling and no abnormal enhancement.  MRI of the brain was normal  Laboratory evaluation in August  2020 showed negative NMO  antibody, mild elevated A1c 7.1, CMP showed mild elevated glucose 122, creatinine of 1.21, normal or negative ANA, HIV, RPR,  copper, ferritin, vitamin D level was 13.7, decreased WBC of 2.9, ESR was mildly elevated 26, normal CPK, TSH, C-reactive protein, B12,  UPDATE July 19 2019: He was noted to have decreased WBC 2.8 in laboratory evaluation, CellCept was stopped at the end of March 2021, was referred to hematologist,  I was able to review his hematology evaluation by Dr. Lindi Adie on May 17, 2019, neutropenia, WBC of 2.8, ANC of 1.2, noticed neutropenia since January 2019, repeat laboratory evaluation showed WBC of 3, ANC was 1.5, B12 was 181, negative ANA, folic acid 6.1, LDH of 150  Based on these laboratory evaluations, B12 deficiency is likely account for his neutropenia and, hematologist Dr.  Lindi Adie  have suggested resume CellCept if it is necessary  I also talked with his dermatologist Dr. 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  ALLERGIES: Allergies  Allergen Reactions  . Oxycodone-Acetaminophen Hives and Other (See Comments)    Makes him loopy     HOME MEDICATIONS: Current Outpatient Medications  Medication Sig Dispense Refill  . Cholecalciferol (VITAMIN D3 PO) Take 2,000 Units by mouth daily.    Marland Kitchen gabapentin (NEURONTIN) 300 MG capsule Take 3 capsules (900 mg total) by mouth 3 (three) times daily. 270 capsule 11  . metFORMIN (GLUCOPHAGE) 500 MG tablet Take 1 tablet (500 mg total) by mouth 3 (three) times daily with meals. 270 tablet 1   No current facility-administered medications for this visit.    PAST  MEDICAL HISTORY: Past Medical History:  Diagnosis Date  . Anxiety   . Diabetes mellitus without complication ([place])    type 2   . Neuromyelitis optica ([place])     PAST SURGICAL HISTORY: Past Surgical History:  Procedure Laterality Date  . CYSTOSCOPY WITH STENT PLACEMENT Left 01/15/2018   Procedure: CYSTOSCOPY WITH STENT PLACEMENT retrograde pylegram;  Surgeon: Franchot Gallo, MD;  Location: [facility];  Service: Urology;  Laterality: Left;  . CYSTOSCOPY/URETEROSCOPY/HOLMIUM LASER/STENT PLACEMENT Left 02/05/2018   Procedure: CYSTOSCOPY/URETEROSCOPY/HOLMIUM LASER/STENT EXTRACTION WITH LEFT STENT PLACEMENT;  Surgeon: Franchot Gallo, MD;  Location: [facility];  Service: Urology;  Laterality: Left;    FAMILY HISTORY: Family History  Problem Relation Age of Onset  . Hypertension Mother   . Hypertension Father   . Diabetes Father     SOCIAL HISTORY: Social History   Socioeconomic History  . Marital status: Married    Spouse name: Not on file  . Number of children: 2  . Years of education: college  . Highest education level: Bachelor's degree (e.g., BA, AB, BS)  Occupational History  . Occupation: Disabled  Tobacco Use  . Smoking status: Never Smoker  . Smokeless tobacco: Never Used  Vaping Use  . Vaping Use: Never used  Substance and Sexual Activity  . Alcohol use: Not Currently    Comment: social  . Drug use: Never  . Sexual activity: Not Currently  Other Topics Concern  . Not on file  Social History Narrative   Lives at home with his wife and son.   Right-handed.   Moderate caffeine use.   Social Determinants of Health   Financial Resource Strain: Low Risk   . Difficulty of Paying Living Expenses: Not hard at all  Food Insecurity: No Food Insecurity  . Worried About Charity fundraiser in the Last Year: Never true  . Ran Out of Food in the Last Year: Never true  Transportation Needs: No Transportation Needs  . Lack of Transportation (Medical): No  . Lack of  Transportation (Non-Medical): No  Physical Activity: Insufficiently Active  . Days of Exercise per Week: 3 days  . Minutes of Exercise per Session: 30 min  Stress: Stress Concern Present  . Feeling of Stress : To some extent  Social Connections: Unknown  . Frequency of Communication with Friends and Family: Three times a week  . Frequency of Social Gatherings with Friends and Family: Twice a week  . Attends Religious Services: Patient refused  . Active Member of Clubs or Organizations: Patient refused  . Attends Archivist Meetings: Patient refused  . Marital Status: Married  Human resources officer Violence: Not At Risk  . Fear of Current or Ex-Partner: No  . Emotionally Abused: No  . Physically Abused: No  . Sexually Abused: No     PHYSICAL EXAM   Vitals:   07/19/19 1323  BP: 119/79  Pulse: 80   Not recorded     There is no height or weight on file to calculate BMI.  PHYSICAL EXAMNIATION:  Gen: NAD, conversant, well nourised, obese, well groomed                     Cardiovascular: Regular rate rhythm, no peripheral edema, warm, nontender. Eyes: Conjunctivae clear without exudates or hemorrhage Neck: Supple, no carotid bruits. Pulmonary: Clear to auscultation bilaterally   NEUROLOGICAL EXAM:  MENTAL STATUS: Speech:    Speech is normal; fluent and spontaneous with normal comprehension.  Cognition:     Orientation to time, place and person     Normal recent and remote memory     Normal Attention span and concentration     Normal Language, naming, repeating,spontaneous speech     Fund of knowledge   CRANIAL NERVES: CN II: Visual fields are full to confrontation.Pupils are round equal and briskly reactive to light. CN III, IV, VI: extraocular movement are normal. No ptosis. CN V: Facial sensation is intact to pinprick in all 3 divisions bilaterally. Corneal responses are intact.  CN VII: Face is symmetric with normal eye closure and smile. CN VIII: Hearing  is normal to rubbing fingers CN IX, X: Palate elevates symmetrically. Phonation is normal. CN XI: Head turning and shoulder shrug are intact CN XII: Tongue is midline with normal movements and no atrophy.  MOTOR: He has mild weakness of bilateral shoulder abduction, external rotation, elbow flexion, finger extension and grip  He has no significant movement to lower extremity proximal distal muscles  REFLEXES: Reflexes are 1 and symmetric at the biceps, triceps, absent at knees, and ankles.  SENSORY: Sensory level to T7  COORDINATION:  Normal finger-to-nose, no dysmetria  GAIT/STANCE: Gait was deferred   DIAGNOSTIC DATA (LABS, IMAGING, TESTING) - I reviewed patient records, labs, notes, testing and imaging myself where available.   ASSESSMENT AND PLAN  Barlow Harrison is a 49 y.o. male    Neuromyelitis  Evidence of cervical,  and thoracic spine involvement,  Negative NMO antibodies  Patient reported no significant change since CellCept 500 mg 2 tablets twice a day was stopped at the end of March 2021 due to concern of persistent leukopenia, with WBC of 3.0,  Laboratory evaluation showed low normal range B12, he has been treated with over-the-counter B12 supplement since April 2021,  Repeat WBC with differentiation today, WBC has recovered,or  remain stable, will restart CellCept at 500 mg twice daily to avoid flareups of his neuromyelitis   Marcial Pacas, M.D. Ph.D.  [facility] [address] [place], [place] [address] Ph: [phone] Fax: [phone]  CC: Referring Provider

## 2019-07-22 LAB — CBC WITH DIFFERENTIAL/PLATELET
Basophils Absolute: 0 10*3/uL (ref 0.0–0.2)
Basos: 1 %
EOS (ABSOLUTE): 0.1 10*3/uL (ref 0.0–0.4)
Eos: 4 %
Hematocrit: 41.1 % (ref 37.5–51.0)
Hemoglobin: 13.3 g/dL (ref 13.0–17.7)
Immature Grans (Abs): 0 10*3/uL (ref 0.0–0.1)
Immature Granulocytes: 0 %
Lymphocytes Absolute: 0.9 10*3/uL (ref 0.7–3.1)
Lymphs: 32 %
MCH: 25 pg — ABNORMAL LOW (ref 26.6–33.0)
MCHC: 32.4 g/dL (ref 31.5–35.7)
MCV: 77 fL — ABNORMAL LOW (ref 79–97)
Monocytes Absolute: 0.5 10*3/uL (ref 0.1–0.9)
Monocytes: 17 %
Neutrophils Absolute: 1.3 10*3/uL — ABNORMAL LOW (ref 1.4–7.0)
Neutrophils: 46 %
Platelets: 256 10*3/uL (ref 150–450)
RBC: 5.33 x10E6/uL (ref 4.14–5.80)
RDW: 15 % (ref 11.6–15.4)
WBC: 2.8 10*3/uL — ABNORMAL LOW (ref 3.4–10.8)

## 2019-07-22 LAB — TSH: TSH: 1.12 u[IU]/mL (ref 0.450–4.500)

## 2019-07-22 LAB — VITAMIN D 25 HYDROXY (VIT D DEFICIENCY, FRACTURES): Vit D, 25-Hydroxy: 15.2 ng/mL — ABNORMAL LOW (ref 30.0–100.0)

## 2019-07-22 LAB — METHYLMALONIC ACID, SERUM: Methylmalonic Acid: 116 nmol/L (ref 0–378)

## 2019-07-22 LAB — HOMOCYSTEINE: Homocysteine: 20 umol/L — ABNORMAL HIGH (ref 0.0–14.5)

## 2019-07-22 LAB — VITAMIN B12: Vitamin B-12: >2000 pg/mL — ABNORMAL HIGH (ref 232–1245)

## 2019-07-22 LAB — COPPER, SERUM: Copper: 117 ug/dL (ref 69–132)

## 2019-07-22 LAB — VITAMIN B1: Thiamine: 76.7 nmol/L (ref 66.5–200.0)

## 2019-07-22 LAB — FERRITIN: Ferritin: 67 ng/mL (ref 30–400)

## 2019-07-26 ENCOUNTER — Telehealth: Payer: Self-pay | Admitting: Neurology

## 2019-07-26 MED ORDER — MYCOPHENOLATE MOFETIL 500 MG PO TABS: 500.0000 mg | ORAL_TABLET | Freq: Two times a day (BID) | ORAL | 4 refills | Status: DC

## 2019-07-26 NOTE — Telephone Encounter (Signed)
I called the patient. He verbalized understanding of the information below. He will keep his follow up w/ hematology. He will restart CellCept 577m BID and continue his vitamin D3 supplement at 2000 units per day.

## 2019-07-26 NOTE — Telephone Encounter (Signed)
Please call patient, laboratory evaluation continues show decreased WBC of 2.8, with absolute neutrophil of 1.3, these numbers are relative stable compared to 2 months ago, she stopped the CellCept since March 2021  His decreased WBC is less likely attributed to CellCept, will restart CellCept 500 mg twice daily to avoid relapse of his neuromyelitis, he is to continue follow-up with his hematologist  Also evidence of vitamin D deficiency 15.2,  he should continue the over-the-counter vitamin D3 supplements, 1000 units, 2 tablets daily  Rest of the laboratory evaluation showed no significant abnormalities.  Meds ordered this encounter  Medications  . mycophenolate (CELLCEPT) 500 MG tablet    Sig: Take 1 tablet (500 mg total) by mouth 2 (two) times daily.    Dispense:  180 tablet    Refill:  4

## 2019-08-11 ENCOUNTER — Telehealth: Payer: Self-pay | Admitting: Neurology

## 2019-08-11 ENCOUNTER — Other Ambulatory Visit: Payer: Self-pay | Admitting: *Deleted

## 2019-08-11 DIAGNOSIS — G369 Acute disseminated demyelination, unspecified: Secondary | ICD-10-CM

## 2019-08-11 NOTE — Telephone Encounter (Signed)
I returned the call to the patient. He needs a DME prescription for adaptive hand controls and power seat for his vehicle. The rx has been printed, signed and placed up front. He plans to pick it up this afternoon.

## 2019-08-11 NOTE — Telephone Encounter (Signed)
Phone rep checked office voicemail's, stating he would like a call from [facility]

## 2019-08-18 ENCOUNTER — Inpatient Hospital Stay: Payer: Medicare PPO | Attending: Hematology and Oncology | Admitting: Hematology and Oncology

## 2019-08-18 ENCOUNTER — Inpatient Hospital Stay: Payer: Medicare PPO

## 2019-08-18 NOTE — Progress Notes (Deleted)
Called to f/u with pt about rescheduling. He stated he wasn't aware of appt. Willing to reschedule. Scheduling message sent

## 2019-08-18 NOTE — Assessment & Plan Note (Deleted)
Neutropenia started 02/05/2018.  He was previously in [place] and moved to [place] for his wife. Lab review: 05/17/2019: WBC 3, hemoglobin 13.4, ANC 1.5 (it was 1.2 on 04/19/2019) B12: 181, ANA: Negative, Folic acid: 6.1, LDH: 216  Current treatment: Vitamin B12 supplement Lab review:  Return to clinic in 4 months with labs and follow-up

## 2019-09-01 ENCOUNTER — Inpatient Hospital Stay: Payer: Medicare PPO

## 2019-09-01 ENCOUNTER — Inpatient Hospital Stay: Payer: Medicare PPO | Admitting: Hematology and Oncology

## 2019-09-05 NOTE — Progress Notes (Signed)
Patient Care Team: Maximiano Coss, NP as PCP - General (Adult Health Nurse Practitioner)  DIAGNOSIS:    ICD-10-CM   1. Neutropenia, unspecified type ([place])  D70.9 CBC with Differential (Cancer Center Only)    CHIEF COMPLIANT: Follow-up of leukopenia to review labs  INTERVAL HISTORY: Victor Christensen is a 49 y.o. with above-mentioned history of neuromyelitisand recent leukopenia. He presents to the clinic today to review his labs.   He remains in a wheelchair and he reports that recently has had a urinary tract infection for which she was on antibiotics with urology.  Otherwise he is doing quite well.  He reports no new problems or concerns.  Denies any fevers or chills today.  ALLERGIES:  is allergic to oxycodone-acetaminophen.  MEDICATIONS:  Current Outpatient Medications  Medication Sig Dispense Refill  . Cholecalciferol (VITAMIN D3 PO) Take 2,000 Units by mouth daily.    Marland Kitchen gabapentin (NEURONTIN) 300 MG capsule Take 3 capsules (900 mg total) by mouth 3 (three) times daily. 270 capsule 11  . metFORMIN (GLUCOPHAGE) 500 MG tablet Take 1 tablet (500 mg total) by mouth 3 (three) times daily with meals. 270 tablet 1  . mycophenolate (CELLCEPT) 500 MG tablet Take 1 tablet (500 mg total) by mouth 2 (two) times daily. 180 tablet 4   No current facility-administered medications for this visit.    PHYSICAL EXAMINATION: ECOG PERFORMANCE STATUS: 1 - Symptomatic but completely ambulatory  Vitals:   09/06/19 1440  BP: 96/74  Pulse: 92  Resp: 18  Temp: 97.9 F (36.6 C)  SpO2: 99%   Filed Weights    LABORATORY DATA:  I have reviewed the data as listed CMP Latest Ref Rng & Units 09/16/2018 02/05/2018 01/17/2018  Glucose 65 - 99 mg/dL 122(H) 113(H) 123(H)  BUN 6 - 24 mg/dL 13 12 13   Creatinine 0.76 - 1.27 mg/dL 1.21 1.13 1.15  Sodium 134 - 144 mmol/L 138 136 136  Potassium 3.5 - 5.2 mmol/L 4.7 4.3 4.0  Chloride 96 - 106 mmol/L 100 104 100  CO2 20 - 29 mmol/L 23 24 24   Calcium 8.7 -  10.2 mg/dL 10.0 9.2 8.6(L)  Total Protein 6.0 - 8.5 g/dL 7.4 - -  Total Bilirubin 0.0 - 1.2 mg/dL 0.4 - -  Alkaline Phos 39 - 117 IU/L 48 - -  AST 0 - 40 IU/L 23 - -  ALT 0 - 44 IU/L 26 - -    Lab Results  Component Value Date   WBC 4.5 09/06/2019   HGB 12.6 (L) 09/06/2019   HCT 40.5 09/06/2019   MCV 77.4 (L) 09/06/2019   PLT 277 09/06/2019   NEUTROABS PENDING 09/06/2019    ASSESSMENT & PLAN:  Neutropenia ([place]) Neutropenia started 02/05/2018.  He was previously in [place] and moved to [place] for his wife. Lab review: 05/17/2019: WBC 3, hemoglobin 13.4, ANC 1.5 (it was 1.2 on 04/19/2019) B12: 181 ANA: Negative Folic acid: 6.1, LDH: 893 09/06/2019: WBC 4.5, hemoglobin 12.6, platelets 277  Current treatment: B12 supplementation 5000 mcg sublingually daily Either because of the CellCept or because of B12 supplementation his white count has improved. Recent UTI Follows with Dr. Krista Blue.  With rheumatology  We can watch and monitor his counts in 6 months and follow-up If his white count is stable then we can sign off on his case at that time.   Orders Placed This Encounter  Procedures  . CBC with Differential (Cancer Center Only)    Standing Status:   Future  Standing Expiration Date:   09/05/2020   The patient has a good understanding of the overall plan. he agrees with it. he will call with any problems that may develop before the next visit here.  Total time spent: 20 mins including face to face time and time spent for planning, charting and coordination of care  Nicholas Lose, MD 09/06/2019  I, Cloyde Reams Dorshimer, am acting as scribe for Dr. Nicholas Lose.  I have reviewed the above documentation for accuracy and completeness, and I agree with the above.

## 2019-09-06 ENCOUNTER — Other Ambulatory Visit: Payer: Self-pay

## 2019-09-06 ENCOUNTER — Inpatient Hospital Stay: Payer: Medicare PPO | Attending: Hematology and Oncology

## 2019-09-06 ENCOUNTER — Inpatient Hospital Stay: Payer: Medicare PPO | Admitting: Hematology and Oncology

## 2019-09-06 DIAGNOSIS — D709 Neutropenia, unspecified: Secondary | ICD-10-CM

## 2019-09-06 DIAGNOSIS — Z87440 Personal history of urinary (tract) infections: Secondary | ICD-10-CM | POA: Diagnosis not present

## 2019-09-06 DIAGNOSIS — Z7984 Long term (current) use of oral hypoglycemic drugs: Secondary | ICD-10-CM | POA: Insufficient documentation

## 2019-09-06 DIAGNOSIS — Z79899 Other long term (current) drug therapy: Secondary | ICD-10-CM | POA: Insufficient documentation

## 2019-09-06 LAB — CBC WITH DIFFERENTIAL (CANCER CENTER ONLY)
Abs Immature Granulocytes: 0.06 10*3/uL (ref 0.00–0.07)
Basophils Absolute: 0.1 10*3/uL (ref 0.0–0.1)
Basophils Relative: 1 %
Eosinophils Absolute: 0.2 10*3/uL (ref 0.0–0.5)
Eosinophils Relative: 5 %
HCT: 40.5 % (ref 39.0–52.0)
Hemoglobin: 12.6 g/dL — ABNORMAL LOW (ref 13.0–17.0)
Immature Granulocytes: 1 %
Lymphocytes Relative: 27 %
Lymphs Abs: 1.2 10*3/uL (ref 0.7–4.0)
MCH: 24.1 pg — ABNORMAL LOW (ref 26.0–34.0)
MCHC: 31.1 g/dL (ref 30.0–36.0)
MCV: 77.4 fL — ABNORMAL LOW (ref 80.0–100.0)
Monocytes Absolute: 0.6 10*3/uL (ref 0.1–1.0)
Monocytes Relative: 14 %
Neutro Abs: 2.4 10*3/uL (ref 1.7–7.7)
Neutrophils Relative %: 52 %
Platelet Count: 277 10*3/uL (ref 150–400)
RBC: 5.23 MIL/uL (ref 4.22–5.81)
RDW: 14.8 % (ref 11.5–15.5)
WBC Count: 4.5 10*3/uL (ref 4.0–10.5)
nRBC: 0 % (ref 0.0–0.2)

## 2019-09-06 LAB — VITAMIN B12: Vitamin B-12: 1580 pg/mL — ABNORMAL HIGH (ref 180–914)

## 2019-09-06 NOTE — Assessment & Plan Note (Signed)
Neutropenia started 02/05/2018.  He was previously in [place] and moved to [place] for his wife. Lab review: 05/17/2019: WBC 3, hemoglobin 13.4, ANC 1.5 (it was 1.2 on 04/19/2019) B12: 181 ANA: Negative Folic acid: 6.1, LDH: 616  Current treatment: B12 supplementation  Follows with Dr. Krista Blue.  We can watch and monitor his counts in 6 months and follow-up

## 2019-09-20 ENCOUNTER — Ambulatory Visit: Payer: Medicare PPO | Admitting: Neurology

## 2019-10-06 ENCOUNTER — Ambulatory Visit: Payer: Medicare PPO | Admitting: Registered Nurse

## 2019-10-06 ENCOUNTER — Other Ambulatory Visit: Payer: Self-pay

## 2019-10-06 ENCOUNTER — Encounter: Payer: Self-pay | Admitting: Registered Nurse

## 2019-10-06 VITALS — BP 110/77 | HR 83 | Temp 98.1°F | Resp 18 | Ht 71.0 in | Wt 235.0 lb

## 2019-10-06 DIAGNOSIS — E119 Type 2 diabetes mellitus without complications: Secondary | ICD-10-CM

## 2019-10-06 LAB — POCT GLYCOSYLATED HEMOGLOBIN (HGB A1C): Hemoglobin A1C: 6.7 % — AB (ref 4.0–5.6)

## 2019-10-06 NOTE — Patient Instructions (Signed)
° ° ° °  If you have lab work done today you will be contacted with your lab results within the next 2 weeks.  If you have not heard from us then please contact us. The fastest way to get your results is to register for My Chart. ° ° °IF you received an x-ray today, you will receive an invoice from [facility]. Please contact [facility] at [phone] with questions or concerns regarding your invoice.  ° °IF you received labwork today, you will receive an invoice from [facility]. Please contact [facility] at [phone] with questions or concerns regarding your invoice.  ° °Our billing staff will not be able to assist you with questions regarding bills from these companies. ° °You will be contacted with the lab results as soon as they are available. The fastest way to get your results is to activate your My Chart account. Instructions are located on the last page of this paperwork. If you have not heard from us regarding the results in 2 weeks, please contact this office. °  ° ° ° °

## 2019-10-07 LAB — CBC
Hematocrit: 42.7 % (ref 37.5–51.0)
Hemoglobin: 13.5 g/dL (ref 13.0–17.7)
MCH: 24.9 pg — ABNORMAL LOW (ref 26.6–33.0)
MCHC: 31.6 g/dL (ref 31.5–35.7)
MCV: 79 fL (ref 79–97)
Platelets: 226 10*3/uL (ref 150–450)
RBC: 5.42 x10E6/uL (ref 4.14–5.80)
RDW: 16.2 % — ABNORMAL HIGH (ref 11.6–15.4)
WBC: 2.9 10*3/uL — ABNORMAL LOW (ref 3.4–10.8)

## 2019-10-07 LAB — COMPREHENSIVE METABOLIC PANEL
ALT: 29 IU/L (ref 0–44)
AST: 22 IU/L (ref 0–40)
Albumin/Globulin Ratio: 1.5 (ref 1.2–2.2)
Albumin: 4.5 g/dL (ref 4.0–5.0)
Alkaline Phosphatase: 52 IU/L (ref 48–121)
BUN/Creatinine Ratio: 9 (ref 9–20)
BUN: 10 mg/dL (ref 6–24)
Bilirubin Total: 0.4 mg/dL (ref 0.0–1.2)
CO2: 24 mmol/L (ref 20–29)
Calcium: 9.7 mg/dL (ref 8.7–10.2)
Chloride: 100 mmol/L (ref 96–106)
Creatinine, Ser: 1.09 mg/dL (ref 0.76–1.27)
GFR calc Af Amer: 92 mL/min/{1.73_m2} (ref >59)
GFR calc non Af Amer: 80 mL/min/{1.73_m2} (ref >59)
Globulin, Total: 3.1 g/dL (ref 1.5–4.5)
Glucose: 87 mg/dL (ref 65–99)
Potassium: 4.6 mmol/L (ref 3.5–5.2)
Sodium: 138 mmol/L (ref 134–144)
Total Protein: 7.6 g/dL (ref 6.0–8.5)

## 2019-10-07 LAB — VITAMIN B12: Vitamin B-12: 1116 pg/mL (ref 232–1245)

## 2019-10-07 LAB — LIPID PANEL
Chol/HDL Ratio: 4 ratio (ref 0.0–5.0)
Cholesterol, Total: 136 mg/dL (ref 100–199)
HDL: 34 mg/dL — ABNORMAL LOW (ref >39)
LDL Chol Calc (NIH): 78 mg/dL (ref 0–99)
Triglycerides: 132 mg/dL (ref 0–149)
VLDL Cholesterol Cal: 24 mg/dL (ref 5–40)

## 2019-10-08 ENCOUNTER — Encounter: Payer: Self-pay | Admitting: Registered Nurse

## 2019-10-13 ENCOUNTER — Telehealth: Payer: Self-pay | Admitting: Neurology

## 2019-10-13 NOTE — Telephone Encounter (Signed)
Pt has called to report that [employer] needs CPT code for the hand control & transfer seat for work vehicle.  Please call pt.

## 2019-10-14 NOTE — Telephone Encounter (Signed)
I called the pt. He reports he recent had his transfer seat with hand control installed for work. Pt reports he is needing a CPT code for this equipment to get reimbursed through his insurance.   Pt was given two CPT codes: E0705- Transfer device of any type [phone]- Patient lift, fixed system. Includes all components and accessories.   Pt was advised I was unsure if these are correct since I do not have the information off the specific lift he was given but these were 2 general CPT codes that may be accepted. Pt verbalized understanding and will call back if he has any questions/ concerns.

## 2019-11-29 ENCOUNTER — Telehealth: Payer: Self-pay | Admitting: Neurology

## 2019-11-29 ENCOUNTER — Encounter: Payer: Self-pay | Admitting: *Deleted

## 2019-11-29 NOTE — Telephone Encounter (Signed)
Pt called and LVM stating that he has received a letter that he is needing clarification on. No other information was given. Please advise.

## 2019-11-29 NOTE — Telephone Encounter (Signed)
I returned to the call to the patient. He is going to be dropping off some DMV paperwork for completion. He is aware of the $50 fee.

## 2019-12-01 DIAGNOSIS — Z0289 Encounter for other administrative examinations: Secondary | ICD-10-CM

## 2019-12-09 NOTE — Telephone Encounter (Signed)
DMV paperwork received, completed and returned to medical records.

## 2019-12-14 ENCOUNTER — Telehealth: Payer: Self-pay | Admitting: Neurology

## 2019-12-14 NOTE — Telephone Encounter (Deleted)
Paperwork faxed to [facility] @ [phone] on 12/14/2019.

## 2019-12-15 NOTE — Telephone Encounter (Signed)
[place] DMV fax line down, form mailed to [facility] 12/15/2019.

## 2019-12-25 ENCOUNTER — Encounter: Payer: Self-pay | Admitting: Registered Nurse

## 2019-12-25 NOTE — Progress Notes (Signed)
Established Patient Office Visit  Subjective:  Patient ID: Victor Christensen, male    DOB: [birth year]  Age: 49 y.o. MRN: [id number]  CC:  Chief Complaint  Patient presents with  . Follow-up    6 month follow up for diabetes. patient states he has no questions or concerns    HPI Victor Christensen presents for 6 mo follow up for t2dm.   Feeling well. No new concerns. No symptoms of complications. Needs no refills Has been about 1 year since last labs - reviewed with patient Still following with neuro regularly.  Past Medical History:  Diagnosis Date  . Anxiety   . Diabetes mellitus without complication ([place])    type 2   . 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 CYSTOSCOPY WITH STENT PLACEMENT Left 01/15/2018   Procedure: CYSTOSCOPY WITH STENT PLACEMENT retrograde pylegram;  Surgeon: Franchot Gallo, MD;  Location: [facility];  Service: Urology;  Laterality: Left;  . CYSTOSCOPY/URETEROSCOPY/HOLMIUM LASER/STENT PLACEMENT Left 02/05/2018   Procedure: CYSTOSCOPY/URETEROSCOPY/HOLMIUM LASER/STENT EXTRACTION WITH LEFT STENT PLACEMENT;  Surgeon: Franchot Gallo, MD;  Location: [facility];  Service: Urology;  Laterality: Left;    Family History  Problem Relation Age of Onset  . Hypertension Mother   . Hypertension Father   . Diabetes Father     Social History   Socioeconomic History  . Marital status: Married    Spouse name: Not on file  . Number of children: 2  . Years of education: college  . Highest education level: Bachelor's degree (e.g., BA, AB, BS)  Occupational History  . Occupation: Disabled  Tobacco Use  . Smoking status: Never Smoker  . Smokeless tobacco: Never Used  Vaping Use  . Vaping Use: Never used  Substance and Sexual Activity  . Alcohol use: Not Currently    Comment: social  . Drug use: Never  . Sexual activity: Not Currently  Other Topics Concern  . Not on file  Social History Narrative   Lives at home with his  wife and son.   Right-handed.   Moderate caffeine use.   Social Determinants of Health   Financial Resource Strain:   . Difficulty of Paying Living Expenses: Not on file  Food Insecurity:   . Worried About Charity fundraiser in the Last Year: Not on file  . Ran Out of Food in the Last Year: Not on file  Transportation Needs:   . Lack of Transportation (Medical): Not on file  . Lack of Transportation (Non-Medical): Not on file  Physical Activity:   . Days of Exercise per Week: Not on file  . Minutes of Exercise per Session: Not on file  Stress:   . Feeling of Stress : Not on file  Social Connections:   . Frequency of Communication with Friends and Family: Not on file  . Frequency of Social Gatherings with Friends and Family: Not on file  . Attends Religious Services: Not on file  . Active Member of Clubs or Organizations: Not on file  . Attends Archivist Meetings: Not on file  . Marital Status: Not on file  Intimate Partner Violence:   . Fear of Current or Ex-Partner: Not on file  . Emotionally Abused: Not on file  . Physically Abused: Not on file  . Sexually Abused: Not on file    Outpatient Medications Prior to Visit  Medication Sig Dispense Refill  . Cholecalciferol (VITAMIN D3 PO) Take 2,000 Units by mouth  daily.    . gabapentin (NEURONTIN) 300 MG capsule Take 3 capsules (900 mg total) by mouth 3 (three) times daily. 270 capsule 11  . metFORMIN (GLUCOPHAGE) 500 MG tablet Take 1 tablet (500 mg total) by mouth 3 (three) times daily with meals. 270 tablet 1  . mycophenolate (CELLCEPT) 500 MG tablet Take 1 tablet (500 mg total) by mouth 2 (two) times daily. 180 tablet 4   No facility-administered medications prior to visit.    Allergies  Allergen Reactions  . Oxycodone-Acetaminophen Hives and Other (See Comments)    Makes him loopy     ROS Review of Systems  Constitutional: Negative.   HENT: Negative.   Eyes: Negative.   Respiratory: Negative.    Cardiovascular: Negative.   Gastrointestinal: Negative.   Genitourinary: Negative.   Musculoskeletal: Negative.   Skin: Negative.   Neurological: Negative.   Psychiatric/Behavioral: Negative.       Objective:    Physical Exam Constitutional:      General: He is not in acute distress.    Appearance: Normal appearance. He is normal weight. He is not ill-appearing, toxic-appearing or diaphoretic.  Cardiovascular:     Rate and Rhythm: Normal rate and regular rhythm.     Heart sounds: Normal heart sounds. No murmur heard.  No friction rub. No gallop.   Pulmonary:     Effort: Pulmonary effort is normal. No respiratory distress.     Breath sounds: Normal breath sounds. No stridor. No wheezing, rhonchi or rales.  Chest:     Chest wall: No tenderness.  Neurological:     General: No focal deficit present.     Mental Status: He is alert and oriented to person, place, and time. Mental status is at baseline.  Psychiatric:        Mood and Affect: Mood normal.        Behavior: Behavior normal.        Thought Content: Thought content normal.        Judgment: Judgment normal.     BP 110/77   Pulse 83   Temp 98.1 F (36.7 C) (Temporal)   Resp 18   Ht 5' 11"  (1.803 m)   Wt 235 lb (106.6 kg)   SpO2 96%   BMI 32.78 kg/m  Wt Readings from Last 3 Encounters:  10/06/19 235 lb (106.6 kg)  07/05/19 235 lb (106.6 kg)  10/09/18 235 lb (106.6 kg)     Health Maintenance Due  Topic Date Due  . Hepatitis C Screening  Never done  . FOOT EXAM  Never done  . URINE MICROALBUMIN  Never done    There are no preventive care reminders to display for this patient.  Lab Results  Component Value Date   TSH 1.120 07/19/2019   Lab Results  Component Value Date   WBC 2.9 (L) 10/06/2019   HGB 13.5 10/06/2019   HCT 42.7 10/06/2019   MCV 79 10/06/2019   PLT 226 10/06/2019   Lab Results  Component Value Date   NA 138 10/06/2019   K 4.6 10/06/2019   CO2 24 10/06/2019   GLUCOSE 87  10/06/2019   BUN 10 10/06/2019   CREATININE 1.09 10/06/2019   BILITOT 0.4 10/06/2019   ALKPHOS 52 10/06/2019   AST 22 10/06/2019   ALT 29 10/06/2019   PROT 7.6 10/06/2019   ALBUMIN 4.5 10/06/2019   CALCIUM 9.7 10/06/2019   ANIONGAP 8 02/05/2018   Lab Results  Component Value Date   CHOL 136 10/06/2019  Lab Results  Component Value Date   HDL 34 (L) 10/06/2019   Lab Results  Component Value Date   LDLCALC 78 10/06/2019   Lab Results  Component Value Date   TRIG 132 10/06/2019   Lab Results  Component Value Date   CHOLHDL 4.0 10/06/2019   Lab Results  Component Value Date   HGBA1C 6.7 (A) 10/06/2019      Assessment & Plan:   Problem List Items Addressed This Visit      Endocrine   Type 2 diabetes mellitus without complication, without long-term current use of insulin ([place]) - Primary   Relevant Orders   POCT glycosylated hemoglobin (Hb A1C) (Completed)   Lipid Panel (Completed)   CBC (Completed)   Vitamin B12 (Completed)   Comprehensive metabolic panel (Completed)      No orders of the defined types were placed in this encounter.   Follow-up: No follow-ups on file.   PLAN Lab Results  Component Value Date   HGBA1C 6.7 (A) 10/06/2019     A1c still ok today - under control at 6.7.   Monitor diet and exercise  Return in 6 mo  Labs drawn today, will follow up as warranted  Patient encouraged to call clinic with any questions, comments, or concerns.  Maximiano Coss, NP

## 2020-01-06 ENCOUNTER — Encounter: Payer: Self-pay | Admitting: Family Medicine

## 2020-01-06 ENCOUNTER — Telehealth: Payer: Self-pay | Admitting: Neurology

## 2020-01-06 ENCOUNTER — Ambulatory Visit: Payer: Medicare PPO | Admitting: Family Medicine

## 2020-01-06 ENCOUNTER — Other Ambulatory Visit: Payer: Self-pay

## 2020-01-06 ENCOUNTER — Ambulatory Visit: Payer: Medicare PPO | Admitting: Neurology

## 2020-01-06 VITALS — BP 124/85 | HR 75 | Temp 98.4°F | Resp 16

## 2020-01-06 DIAGNOSIS — E119 Type 2 diabetes mellitus without complications: Secondary | ICD-10-CM

## 2020-01-06 DIAGNOSIS — M5412 Radiculopathy, cervical region: Secondary | ICD-10-CM

## 2020-01-06 DIAGNOSIS — M542 Cervicalgia: Secondary | ICD-10-CM

## 2020-01-06 DIAGNOSIS — M792 Neuralgia and neuritis, unspecified: Secondary | ICD-10-CM

## 2020-01-06 MED ORDER — PREDNISONE 20 MG PO TABS: 20.0000 mg | ORAL_TABLET | Freq: Every day | ORAL | 0 refills | Status: DC

## 2020-01-06 MED ORDER — CYCLOBENZAPRINE HCL 10 MG PO TABS: ORAL_TABLET | ORAL | 0 refills | Status: DC

## 2020-01-06 MED ORDER — PREDNISONE 20 MG PO TABS: ORAL_TABLET | ORAL | 0 refills | Status: DC

## 2020-01-06 MED ORDER — DICLOFENAC SODIUM 75 MG PO TBEC: 75.0000 mg | DELAYED_RELEASE_TABLET | Freq: Two times a day (BID) | ORAL | 0 refills | Status: DC

## 2020-01-06 NOTE — Telephone Encounter (Addendum)
I called the patient back. Reports one week ago, he started having left scapula pain. Now he has neck pain radiating down left arm. Also, tingling in fingers 3, 4, 5 on left hand.   Per vo by Dr. Krista Blue, his symptoms may represent a neuromyelitis flare vs injury. His last MRI cervical w/wo was in 09/2018. She is willing to reorder a repeat scan. We would also need to schedule a follow up with her or Butler Denmark, NP.  He has not been seen since 07/19/19 so there is a chance insurance will not cover without an update physical evaluation. Additionally, she stated he may get some relief with a temporary supply of meloxicam 69m, one tablet daily.  I called the patient again to review this plan. He is actually on his way to his PCP office now. He would like to discuss this information with him. He will call our office back if anything further is needed.  He will also need to call [facility] to schedule his routine follow up.

## 2020-01-06 NOTE — Telephone Encounter (Signed)
Pt called, started a week ago with tingling in my neck, last all night. Would like a call from the nurse.
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
## 2020-01-06 NOTE — Patient Instructions (Addendum)
    Try to be careful using the weights to make sure you do not strain your neck.  Take prednisone 20 mg 3 pills daily for 2 days, then 2 daily for 2 days, then 1 daily for 2 days.  I accidentally sent another prescription for prednisone 1 daily to the pharmacy.  Tell the pharmacist you do not want that prescription, that was prescribed in error.  The prednisone will raise your blood sugar transiently.  After you have completed the course of prednisone if you are still having some residual pain take the diclofenac 75 mg 1 twice daily with breakfast and supper for pain and inflammation.  It is related to medications like ibuprofen and Aleve and you should not take any over-the-counter ibuprofen and Aleve type medicines while you are on this.  I also gave you a muscle relaxant that you can take up to 3 times daily.  It is most important that you take it at bedtime, but if you need it in the daytime you can take it also.  If you are not improving we will need to do additional diagnostic tests.  Return as needed   If you have lab work done today you will be contacted with your lab results within the next 2 weeks.  If you have not heard from [place] then please contact us. The fastest way to get your results is to register for My Chart.   IF you received an x-ray today, you will receive an invoice from [facility]. Please contact [facility] at [phone] with questions or concerns regarding your invoice.   IF you received labwork today, you will receive an invoice from [facility]. Please contact [facility] at [phone] with questions or concerns regarding your invoice.   Our billing staff will not be able to assist you with questions regarding bills from these companies.  You will be contacted with the lab results as soon as they are available. The fastest way to get your results is to activate your My Chart account. Instructions are located on the last page of this paperwork. If  you have not heard from [place] regarding the results in 2 weeks, please contact this office.

## 2020-02-21 DIAGNOSIS — N3000 Acute cystitis without hematuria: Secondary | ICD-10-CM | POA: Diagnosis not present

## 2020-02-21 DIAGNOSIS — R3914 Feeling of incomplete bladder emptying: Secondary | ICD-10-CM | POA: Diagnosis not present

## 2020-02-21 DIAGNOSIS — R8271 Bacteriuria: Secondary | ICD-10-CM | POA: Diagnosis not present

## 2020-03-06 DIAGNOSIS — R3914 Feeling of incomplete bladder emptying: Secondary | ICD-10-CM | POA: Diagnosis not present

## 2020-03-07 NOTE — Progress Notes (Signed)
  HEMATOLOGY-ONCOLOGY MYCHART VIDEO VISIT PROGRESS NOTE  I connected with Victor Christensen on 03/08/2020 at  3:30 PM EST by MyChart video conference and verified that I am speaking with the correct person using two identifiers.  I discussed the limitations, risks, security and privacy concerns of performing an evaluation and management service by MyChart and the availability of in person appointments.  I also discussed with the patient that there may be a patient responsible charge related to this service. The patient expressed understanding and agreed to proceed.    CHIEF COMPLIANT: Follow-up of leukopenia to review labs  INTERVAL HISTORY: Victor Christensen is a 50 y.o. male with above-mentioned history of neuromyelitisand recent leukopenia. He presents over MyChart todayto review his labs.     Observations/Objective:  Today's Vitals   03/08/20 1521  BP: 122/75  Pulse: 98  Resp: 18  Temp: 97.9 F (36.6 C)  SpO2: 99%  Height: 5' 11"  (1.803 m)   Body mass index is 32.78 kg/m.  I have reviewed the data as listed CMP Latest Ref Rng & Units 10/06/2019 09/16/2018 02/05/2018  Glucose 65 - 99 mg/dL 87 122(H) 113(H)  BUN 6 - 24 mg/dL 10 13 12   Creatinine 0.76 - 1.27 mg/dL 1.09 1.21 1.13  Sodium 134 - 144 mmol/L 138 138 136  Potassium 3.5 - 5.2 mmol/L 4.6 4.7 4.3  Chloride 96 - 106 mmol/L 100 100 104  CO2 20 - 29 mmol/L 24 23 24   Calcium 8.7 - 10.2 mg/dL 9.7 10.0 9.2  Total Protein 6.0 - 8.5 g/dL 7.6 7.4 -  Total Bilirubin 0.0 - 1.2 mg/dL 0.4 0.4 -  Alkaline Phos 48 - 121 IU/L 52 48 -  AST 0 - 40 IU/L 22 23 -  ALT 0 - 44 IU/L 29 26 -    Lab Results  Component Value Date   WBC 3.9 (L) 03/08/2020   HGB 12.5 (L) 03/08/2020   HCT 41.3 03/08/2020   MCV 81.0 03/08/2020   PLT 240 03/08/2020   NEUTROABS 2.2 03/08/2020      Assessment Plan:  Neutropenia ([place]) Neutropenia started 02/05/2018. He was previously in [place] and moved to [place] for his wife. Lab review: 05/17/2019: WBC 3,  hemoglobin 13.4, ANC 1.5 (it was 1.2 on 04/19/2019) B12: 181 ANA: Negative Folic acid: 6.1, LDH: 154 09/06/2019: WBC 4.5, hemoglobin 12.6, platelets 277 03/09/19:WBC: 3.9, Hb 12.5, ANC 2.2  Current treatment: B12 supplementation 5000 mcg sublingually daily Either because of the CellCept or because of B12 supplementation his white count has improved. Recent UTI (gets once a year) Follows with Dr. Krista Blue.  With rheumatology   RTC in 1 year  I discussed the assessment and treatment plan with the patient. The patient was provided an opportunity to ask questions and all were answered. The patient agreed with the plan and demonstrated an understanding of the instructions. The patient was advised to call back or seek an in-person evaluation if the symptoms worsen or if the condition fails to improve as anticipated.   Total time spent: 20 minutes including face-to-face MyChart video visit time and time spent for planning, charting and coordination of care  Rulon Eisenmenger, MD 03/08/2020   I, Cloyde Reams Dorshimer, am acting as scribe for Nicholas Lose, MD.  I have reviewed the above documentation for accuracy and completeness, and I agree with the above.

## 2020-03-08 ENCOUNTER — Inpatient Hospital Stay: Payer: Medicare PPO | Attending: Hematology and Oncology | Admitting: Hematology and Oncology

## 2020-03-08 ENCOUNTER — Inpatient Hospital Stay: Payer: Medicare PPO

## 2020-03-08 ENCOUNTER — Other Ambulatory Visit: Payer: Self-pay

## 2020-03-08 ENCOUNTER — Other Ambulatory Visit: Payer: Self-pay | Admitting: *Deleted

## 2020-03-08 ENCOUNTER — Telehealth: Payer: Self-pay | Admitting: Hematology and Oncology

## 2020-03-08 DIAGNOSIS — D709 Neutropenia, unspecified: Secondary | ICD-10-CM

## 2020-03-08 DIAGNOSIS — E538 Deficiency of other specified B group vitamins: Secondary | ICD-10-CM

## 2020-03-08 LAB — CBC WITH DIFFERENTIAL (CANCER CENTER ONLY)
Abs Immature Granulocytes: 0.01 10*3/uL (ref 0.00–0.07)
Basophils Absolute: 0 10*3/uL (ref 0.0–0.1)
Basophils Relative: 1 %
Eosinophils Absolute: 0.1 10*3/uL (ref 0.0–0.5)
Eosinophils Relative: 4 %
HCT: 41.3 % (ref 39.0–52.0)
Hemoglobin: 12.5 g/dL — ABNORMAL LOW (ref 13.0–17.0)
Immature Granulocytes: 0 %
Lymphocytes Relative: 27 %
Lymphs Abs: 1.1 10*3/uL (ref 0.7–4.0)
MCH: 24.5 pg — ABNORMAL LOW (ref 26.0–34.0)
MCHC: 30.3 g/dL (ref 30.0–36.0)
MCV: 81 fL (ref 80.0–100.0)
Monocytes Absolute: 0.5 10*3/uL (ref 0.1–1.0)
Monocytes Relative: 12 %
Neutro Abs: 2.2 10*3/uL (ref 1.7–7.7)
Neutrophils Relative %: 56 %
Platelet Count: 240 10*3/uL (ref 150–400)
RBC: 5.1 MIL/uL (ref 4.22–5.81)
RDW: 15.1 % (ref 11.5–15.5)
WBC Count: 3.9 10*3/uL — ABNORMAL LOW (ref 4.0–10.5)
nRBC: 0 % (ref 0.0–0.2)

## 2020-03-08 LAB — VITAMIN B12: Vitamin B-12: 402 pg/mL (ref 180–914)

## 2020-03-08 NOTE — Assessment & Plan Note (Signed)
Neutropenia started 02/05/2018. He was previously in [place] and moved to [place] for his wife. Lab review: 05/17/2019: WBC 3, hemoglobin 13.4, ANC 1.5 (it was 1.2 on 04/19/2019) B12: 181 ANA: Negative Folic acid: 6.1, LDH: 747 09/06/2019: WBC 4.5, hemoglobin 12.6, platelets 277 03/09/19:  Current treatment: B12 supplementation 5000 mcg sublingually daily Either because of the CellCept or because of B12 supplementation his white count has improved. Recent UTI Follows with Dr. Krista Blue.  With rheumatology

## 2020-03-08 NOTE — Telephone Encounter (Signed)
Scheduled appts per 2/9 los. Gave pt a print out of AVS.

## 2020-04-04 ENCOUNTER — Other Ambulatory Visit: Payer: Self-pay

## 2020-04-04 ENCOUNTER — Ambulatory Visit: Payer: Medicare PPO | Admitting: Registered Nurse

## 2020-04-04 ENCOUNTER — Encounter: Payer: Self-pay | Admitting: Registered Nurse

## 2020-04-04 VITALS — BP 102/68 | HR 85 | Temp 98.0°F | Resp 18 | Ht 71.0 in

## 2020-04-04 DIAGNOSIS — E119 Type 2 diabetes mellitus without complications: Secondary | ICD-10-CM | POA: Diagnosis not present

## 2020-04-04 LAB — POCT GLYCOSYLATED HEMOGLOBIN (HGB A1C): Hemoglobin A1C: 7 % — AB (ref 4.0–5.6)

## 2020-04-04 NOTE — Patient Instructions (Signed)
° ° ° °  If you have lab work done today you will be contacted with your lab results within the next 2 weeks.  If you have not heard from us then please contact us. The fastest way to get your results is to register for My Chart. ° ° °IF you received an x-ray today, you will receive an invoice from [facility]. Please contact [facility] at [phone] with questions or concerns regarding your invoice.  ° °IF you received labwork today, you will receive an invoice from [facility]. Please contact [facility] at [phone] with questions or concerns regarding your invoice.  ° °Our billing staff will not be able to assist you with questions regarding bills from these companies. ° °You will be contacted with the lab results as soon as they are available. The fastest way to get your results is to activate your My Chart account. Instructions are located on the last page of this paperwork. If you have not heard from us regarding the results in 2 weeks, please contact this office. °  ° ° ° °

## 2020-04-24 DIAGNOSIS — R3914 Feeling of incomplete bladder emptying: Secondary | ICD-10-CM | POA: Diagnosis not present

## 2020-04-24 DIAGNOSIS — N3000 Acute cystitis without hematuria: Secondary | ICD-10-CM | POA: Diagnosis not present

## 2020-05-24 ENCOUNTER — Other Ambulatory Visit: Payer: Self-pay | Admitting: Registered Nurse

## 2020-05-24 DIAGNOSIS — E119 Type 2 diabetes mellitus without complications: Secondary | ICD-10-CM

## 2020-06-07 ENCOUNTER — Other Ambulatory Visit: Payer: Self-pay | Admitting: Neurology

## 2020-06-13 ENCOUNTER — Other Ambulatory Visit: Payer: Self-pay | Admitting: Registered Nurse

## 2020-06-13 DIAGNOSIS — E119 Type 2 diabetes mellitus without complications: Secondary | ICD-10-CM

## 2020-06-20 ENCOUNTER — Other Ambulatory Visit: Payer: Self-pay

## 2020-06-20 ENCOUNTER — Telehealth: Payer: Self-pay | Admitting: Registered Nurse

## 2020-06-20 DIAGNOSIS — E119 Type 2 diabetes mellitus without complications: Secondary | ICD-10-CM

## 2020-06-20 MED ORDER — METFORMIN HCL 500 MG PO TABS: 1.0000 | ORAL_TABLET | Freq: Three times a day (TID) | ORAL | 1 refills | Status: DC

## 2020-06-20 NOTE — Telephone Encounter (Signed)
Medication sent to pharmacy  

## 2020-06-20 NOTE — Telephone Encounter (Signed)
Pt called in stating that he is out of the metformin, he states that he normally gets a 90 day supply with a refill.   Please advise pt uses [facility] on [address].

## 2020-07-04 ENCOUNTER — Telehealth: Payer: Self-pay | Admitting: Neurology

## 2020-07-04 MED ORDER — PREDNISONE 5 MG PO TABS: ORAL_TABLET | ORAL | 0 refills | Status: DC

## 2020-07-04 NOTE — Telephone Encounter (Signed)
I called the patient.  About 2 weeks ago he developed discomfort and numbness in the right neck and shoulder going down the arm on the right with tingling in the fingers.  He has a history of neuromyelitis optica.  He has a history of diabetes, his last hemoglobin A1c was 7.0, he claims that his blood sugars are under fairly good control.  He is already on gabapentin.  He has an appointment to see Dr. Mary Sella on 01 August 2020.  I will send in a tapering course of prednisone, the patient is to monitor his blood sugars closely on the prednisone.

## 2020-07-04 NOTE — Addendum Note (Signed)
Addended by: Kathrynn Ducking on: 07/04/2020 03:09 PM   Modules accepted: Orders

## 2020-07-04 NOTE — Telephone Encounter (Signed)
Pt called stating he is having neck pain, tingling in fingers, waking up with his hands numb.  Pt accepted next avail appointment but would like to know if Dr Krista Blue could suggest something for him while waiting to be seen, please call.

## 2020-08-01 ENCOUNTER — Ambulatory Visit: Payer: Medicare PPO | Admitting: Neurology

## 2020-08-01 ENCOUNTER — Encounter: Payer: Self-pay | Admitting: Neurology

## 2020-08-01 VITALS — BP 104/68 | HR 88

## 2020-08-01 DIAGNOSIS — E538 Deficiency of other specified B group vitamins: Secondary | ICD-10-CM

## 2020-08-01 DIAGNOSIS — R202 Paresthesia of skin: Secondary | ICD-10-CM | POA: Diagnosis not present

## 2020-08-01 DIAGNOSIS — G369 Acute disseminated demyelination, unspecified: Secondary | ICD-10-CM

## 2020-08-01 DIAGNOSIS — Z113 Encounter for screening for infections with a predominantly sexual mode of transmission: Secondary | ICD-10-CM | POA: Diagnosis not present

## 2020-08-01 MED ORDER — GABAPENTIN 300 MG PO CAPS: 1200.0000 mg | ORAL_CAPSULE | Freq: Three times a day (TID) | ORAL | 11 refills | Status: DC

## 2020-08-01 MED ORDER — DIAZEPAM 5 MG PO TABS: ORAL_TABLET | ORAL | 0 refills | Status: DC

## 2020-08-01 MED ORDER — DULOXETINE HCL 60 MG PO CPEP: 60.0000 mg | ORAL_CAPSULE | Freq: Every day | ORAL | 11 refills | Status: DC

## 2020-08-01 NOTE — Patient Instructions (Signed)
Meds ordered this encounter  Medications   DULoxetine (CYMBALTA) 60 MG capsule    Sig: Take 1 capsule (60 mg total) by mouth daily.    Dispense:  30 capsule    Refill:  11   gabapentin (NEURONTIN) 300 MG capsule    Sig: Take 4 capsules (1,200 mg total) by mouth 3 (three) times daily. Please call [phone] to schedule appt for continued refills.    Dispense:  360 capsule    Refill:  11    Please call [phone] to schedule appt for continued refills.   diazepam (VALIUM) 5 MG tablet    Sig: Take 1-2 tablets 30 minutes prior to MRI, may repeat once as needed. Must have driver.    Dispense:  3 tablet    Refill:  0

## 2020-08-01 NOTE — Progress Notes (Signed)
Chief Complaint  Patient presents with   Follow-up    Room 12 with wife, Victor Christensen. Neuromyelitis follow up. Reports intermittent numbness/tingling down his right arm when he turns or bends his head towards the right. He has discomfort in neck. Symptoms have been present 6+ months. He had temporary relief with Prednisone (finished a tapering rx two weeks ago). Cyclobenzaprine was only mildly helpful.      ASSESSMENT AND PLAN  Victor Christensen is a 50 y.o. male   History of neuromyelitis since 2012, involving cervical and thoracic spinal cord  He was on CellCept 500 mg 2 tablets twice a day, due to leukopenia , which did improve after holding CellCept for a while, was seen by hematologist Dr. Lindi Adie, previous work-up did not reveal etiology, CellCept was restarted since January 2021 but lower dose 500 mg twice daily, he did not notice any ill effect  Will repeat laboratory evaluation   6 months history of worsening right shoulder pain  Musculoskeletal etiology, versus flareup of right cervical pathology  MRI of cervical spine with without contrast  Laboratory evaluations  Warm compression, massage therapy right biceps region  Increased to higher dose of gabapentin 300 mg 4 tablets 3 times a day, add on Cymbalta 60 mg daily   DIAGNOSTIC DATA (LABS, IMAGING, TESTING) - I reviewed patient records, labs, notes, testing and imaging myself where available.   HISTORICAL Victor Christensen is a 50 year old male, accompanied by his wife Victor Christensen, seen in request by previous neurologist Dr. 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   I have reviewed and summarized the referring note from the referring physician.  He had a past medical history of diabetes since 2001, taking Metformin   In 2012, he presented with subacute onset of numbness upper and lower extremity, gait abnormality, incontinence, there was no visual loss   MRI of brain was  negative, cervical spine with and without contrast showed contrast-enhancing cervical lesions, thoracic spine also showed contrast-enhancing upper cord edema,   CSF showed WBC of 215, protein of 258, 2 oligoclonal banding, IgG index was mildly elevated 0.7, serum NML antibody was negative, visual evoked potential was negative   He was diagnosed with neuromyelitis, was treated with rituximab 1000 mg IV on June 2012, August 07, 2010, January 2013, August 2013, February 2014, with treatment, he was able to regain some function, able to ambulate with walker, upper extremity strength mostly recovered.   While taking rituximab infusion, he had a few flareups, presented with worsening bilateral lower extremity weakness, sensory changes, eventually lost mobility in April 2014, become wheelchair-bound, he was seen at [facility] in 2014, was put on CellCept 500 mg 2 tablets twice a day ever since 2014.   He had another bouts of flareup in 2016, presented with worsening lower extremity neuropathic pain, He was treated with IV Solu-Medrol in October 2016, followed by plasma exchange in October 2016, with significant improvement of his lower extremity neuropathic pain   He has been on stable dose of CellCept 500 mg 2 tablets twice a day since.   He spent most of the day in wheelchair, has fingertips paresthesia, mild hand grip weakness, but overall upper extremity function well, able to transfer himself in and out of wheelchair, driving with a modified car, keep a bowel regimen, wear depends.   UPDATE Mar 22 2019: Record from [facility] on October 22, 2014, MRI of cervical spine with and without contrast, there is  patchy signal abnormality, extends from the cervical medullary junction eccentric towards the left, along the central and dorsal aspect of the cord C2, 3, right dorsal cord C6, and patchy signal abnormality at C7, upper thoracic cord, on the sagittal STIR imagings or thoracic spinal cord,  there are diffuse patchy signal abnormality in the cord, and mild diffuse volume loss, there is irregular linear band of enhancement cervical spinal cord extending from posterior cervical medullary junction to the C6 level, also at T1-T2, T2-T3 level finding appear to progress since most recent MRI cervical spine in 2015, but similar to more remote cervical spine imaging in 2014, no additional enhancing abnormality identified in the remainder of the thoracic spinal cord,Tiny disc protrusion at T3-T4,   I personally reviewed MRI of spines with without contrast in September 2020 There is generalized atrophy of cervical and upper thoracic spine, with patchy signal changes, correlate with remote insult in this patient, Cord: Diffusely small cord measuring only 3-4 mm anterior to posterior at the cervicothoracic junction. Cord also has heterogeneous signal on T2 and STIR imaging, attributed to gliosis from prior demyelination. No expansion/swelling and no abnormal enhancement.   MRI of the brain was normal   Laboratory evaluation in August 2020 showed negative NMO  antibody, mild elevated A1c 7.1, CMP showed mild elevated glucose 122, creatinine of 1.21, normal or negative ANA, HIV, RPR, copper, ferritin, vitamin D level was 13.7, decreased WBC of 2.9, ESR was mildly elevated 26, normal CPK, TSH, C-reactive protein, B12,   UPDATE July 19 2019: He was noted to have decreased WBC 2.8 in laboratory evaluation, CellCept was stopped at the end of March 2021, was referred to hematologist,   I was able to review his hematology evaluation by Dr. Lindi Adie on May 17, 2019, neutropenia, WBC of 2.8, ANC of 1.2, noticed neutropenia since January 2019, repeat laboratory evaluation showed WBC of 3, ANC was 1.5, B12 was 181, negative ANA, folic acid 6.1, LDH of 150   Based on these laboratory evaluations, B12 deficiency is likely account for his neutropenia and, hematologist Dr.  Lindi Adie  have suggested resume CellCept if  it is necessary   I also talked with his dermatologist Dr. 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, suspicious for cutaneous sarcoidosis, low likelihood of infectious disease, per patient, chest x-ray was negative, culture was negative, he was given steroid cream, which has helped his symptoms   Since CellCept was stopped in March 2021, most recent CBC was on May 17, 2019, WBC was 3.0,   UPDATE July 5th 2022: He is accompanied by his wife at today's visit, complains of increased right shoulder pain since beginning of 2022, he also noticed significant tenderness at upper biceps region, he cannot clearly identify a trigger event, but he does like to throw  ball using his right hand, he workout his upper extremity regularly, rely on his shoulder and arm to transfer in and out of wheelchair, over the past 6 months, his right shoulder and arm discomfort consistent, he noticed more than right hand numbness tingling, mild right-sided neck pain,  He was given prednisone tapering dose for 1 week, it does help him some  He is now on CellCept 500 mg 1 tablet twice a day, previously when he was on higher dose 2 tablets twice a day, there was noticeable leukopenia, CellCept was on hold, he was seen by hematologist, no etiology was found, it was okay per hematology to  restart CellCept, which she is now taking 500 mg twice daily since June 2021, he denies significant ill effect  PHYSICAL EXAM:   Vitals:   08/01/20 1416  BP: 104/68  Pulse: 88   Not recorded     There is no height or weight on file to calculate BMI.  PHYSICAL EXAMNIATION:  Gen: NAD, conversant, well nourised, well groomed                     Cardiovascular: Regular rate rhythm, no peripheral edema, warm, nontender. Eyes: Conjunctivae clear without exudates or hemorrhage Neck: Supple, no carotid bruits. Pulmonary: Clear to auscultation bilaterally   NEUROLOGICAL  EXAM:  MENTAL STATUS: Speech/cognition Awake, alert, oriented to history taking and casual conversation CRANIAL NERVES: CN II: Visual fields are full to confrontation. Pupils are round equal and briskly reactive to light. CN III, IV, VI: extraocular movement are normal. No ptosis. CN V: Facial sensation is intact to light touch CN VII: Face is symmetric with normal eye closure  CN VIII: Hearing is normal to causal conversation. CN IX, X: Phonation is normal. CN XI: Head turning and shoulder shrug are intact  MOTOR: Very muscular bilateral upper extremity, mild bilateral shoulder abduction, external rotation weakness, no significant distal weakness, tenderness of right biceps tender to right shoulder, No movement at lower extremity  REFLEXES: Reflexes are 1 and symmetric at the biceps, triceps, 3/3 knees,   SENSORY: Decreased light touch, pinprick vibratory sensation to knee level  COORDINATION: There is no trunk or limb dysmetria noted.  GAIT/STANCE: Deferred REVIEW OF SYSTEMS:  Full 14 system review of systems performed and notable only for as above All other review of systems were negative.   ALLERGIES: Allergies  Allergen Reactions   Oxycodone-Acetaminophen Hives and Other (See Comments)    Makes him loopy     HOME MEDICATIONS: Current Outpatient Medications  Medication Sig Dispense Refill   Cholecalciferol (VITAMIN D3 PO) Take 2,000 Units by mouth daily.     gabapentin (NEURONTIN) 300 MG capsule Take 3 capsules (900 mg total) by mouth 3 (three) times daily. Please call [phone] to schedule appt for continued refills. 270 capsule 1   metFORMIN (GLUCOPHAGE) 500 MG tablet Take 1 tablet (500 mg total) by mouth 3 (three) times daily with meals. 270 tablet 1   mycophenolate (CELLCEPT) 500 MG tablet Take 1 tablet (500 mg total) by mouth 2 (two) times daily. 180 tablet 4   No current facility-administered medications for this visit.    PAST MEDICAL HISTORY: Past Medical  History:  Diagnosis Date   Anxiety    Diabetes mellitus without complication ([place])    type 2    Neuromyelitis optica ([place])     PAST SURGICAL HISTORY: Past Surgical History:  Procedure Laterality Date   CYSTOSCOPY WITH STENT PLACEMENT Left 01/15/2018   Procedure: CYSTOSCOPY WITH STENT PLACEMENT retrograde pylegram;  Surgeon: Franchot Gallo, MD;  Location: [facility];  Service: Urology;  Laterality: Left;   CYSTOSCOPY/URETEROSCOPY/HOLMIUM LASER/STENT PLACEMENT Left 02/05/2018   Procedure: CYSTOSCOPY/URETEROSCOPY/HOLMIUM LASER/STENT EXTRACTION WITH LEFT STENT PLACEMENT;  Surgeon: Franchot Gallo, MD;  Location: [facility];  Service: Urology;  Laterality: Left;    FAMILY HISTORY: Family History  Problem Relation Age of Onset   Hypertension Mother    Hypertension Father    Diabetes Father     SOCIAL HISTORY: Social History   Socioeconomic History   Marital status: Married    Spouse name: Not on file   Number of children: 2  Years of education: college   Highest education level: Bachelor's degree (e.g., BA, AB, BS)  Occupational History   Occupation: Disabled  Tobacco Use   Smoking status: Never   Smokeless tobacco: Never  Vaping Use   Vaping Use: Never used  Substance and Sexual Activity   Alcohol use: Not Currently    Comment: social   Drug use: Never   Sexual activity: Not Currently  Other Topics Concern   Not on file  Social History Narrative   Lives at home with his wife and son.   Right-handed.   Moderate caffeine use.   Social Determinants of Health   Financial Resource Strain: Not on file  Food Insecurity: Not on file  Transportation Needs: Not on file  Physical Activity: Not on file  Stress: Not on file  Social Connections: Not on file  Intimate Partner Violence: Not on file      Marcial Pacas, M.D. Ph.D.  [facility] [address], [place], [place] [address] Ph: [phone] Fax: [phone]  CC:  Maximiano Coss,  NP [address] [place],  [place] [address]  Maximiano Coss, NP

## 2020-08-03 ENCOUNTER — Telehealth: Payer: Self-pay | Admitting: Neurology

## 2020-08-03 ENCOUNTER — Encounter: Payer: Self-pay | Admitting: Neurology

## 2020-08-03 DIAGNOSIS — D72819 Decreased white blood cell count, unspecified: Secondary | ICD-10-CM | POA: Insufficient documentation

## 2020-08-03 DIAGNOSIS — G369 Acute disseminated demyelination, unspecified: Secondary | ICD-10-CM

## 2020-08-03 DIAGNOSIS — E538 Deficiency of other specified B group vitamins: Secondary | ICD-10-CM

## 2020-08-03 NOTE — Telephone Encounter (Signed)
I spoke to the patient. He has been taking CellCept 565m, one tab BID. He understands to hold it for now. He is agreeable to see hematologist.   He will continue B12 10075m daily and restart vitamin D3 2000 units daily.

## 2020-08-03 NOTE — Telephone Encounter (Signed)
Please call patient to stop the CellCept, repeat laboratory evaluation showed significant decrease of WBC from previous baseline of 3.9 to 2.1, hemoglobin was above the baseline 12.6, please also double check the CellCept the dose that he is taking now, per computer chart should be 500 three times a day by Dr. Maximiano Coss on Jun 20 2020.  I will refer him back to hematologist for further evaluation, hold off CellCept for now,  Vitamin D deficiency level was 24, he should take over-the-counter D3 supplement 1000 units 2 tablets daily  CMP showed elevated glucose 144, A1c was 7.4, consistent with his  diagnosis of diabetes, already on metformin 500 mg 3 times a day  I have forwarded the lab result to his primary care Maximiano Coss, NP,    B12 level was within normal limit now, but still has evidence of elevated homocystine, he should continue B12 supplement, at least 1000 mcg daily

## 2020-08-04 ENCOUNTER — Telehealth: Payer: Self-pay | Admitting: Hematology and Oncology

## 2020-08-04 LAB — RPR: RPR Ser Ql: NONREACTIVE

## 2020-08-04 LAB — CBC WITH DIFFERENTIAL/PLATELET
Basophils Absolute: 0 10*3/uL (ref 0.0–0.2)
Basos: 1 %
EOS (ABSOLUTE): 0.1 10*3/uL (ref 0.0–0.4)
Eos: 4 %
Hematocrit: 40.8 % (ref 37.5–51.0)
Hemoglobin: 12.6 g/dL — ABNORMAL LOW (ref 13.0–17.7)
Immature Grans (Abs): 0 10*3/uL (ref 0.0–0.1)
Immature Granulocytes: 0 %
Lymphocytes Absolute: 0.7 10*3/uL (ref 0.7–3.1)
Lymphs: 32 %
MCH: 24.1 pg — ABNORMAL LOW (ref 26.6–33.0)
MCHC: 30.9 g/dL — ABNORMAL LOW (ref 31.5–35.7)
MCV: 78 fL — ABNORMAL LOW (ref 79–97)
Monocytes Absolute: 0.3 10*3/uL (ref 0.1–0.9)
Monocytes: 15 %
Neutrophils Absolute: 1 10*3/uL — ABNORMAL LOW (ref 1.4–7.0)
Neutrophils: 48 %
Platelets: 219 10*3/uL (ref 150–450)
RBC: 5.22 x10E6/uL (ref 4.14–5.80)
RDW: 15 % (ref 11.6–15.4)
WBC: 2.1 10*3/uL — CL (ref 3.4–10.8)

## 2020-08-04 LAB — COMPREHENSIVE METABOLIC PANEL
ALT: 21 IU/L (ref 0–44)
AST: 19 IU/L (ref 0–40)
Albumin/Globulin Ratio: 1.7 (ref 1.2–2.2)
Albumin: 4.5 g/dL (ref 4.0–5.0)
Alkaline Phosphatase: 47 IU/L (ref 44–121)
BUN/Creatinine Ratio: 11 (ref 9–20)
BUN: 13 mg/dL (ref 6–24)
Bilirubin Total: 0.3 mg/dL (ref 0.0–1.2)
CO2: 26 mmol/L (ref 20–29)
Calcium: 9.7 mg/dL (ref 8.7–10.2)
Chloride: 101 mmol/L (ref 96–106)
Creatinine, Ser: 1.2 mg/dL (ref 0.76–1.27)
Globulin, Total: 2.6 g/dL (ref 1.5–4.5)
Glucose: 144 mg/dL — ABNORMAL HIGH (ref 65–99)
Potassium: 4.7 mmol/L (ref 3.5–5.2)
Sodium: 140 mmol/L (ref 134–144)
Total Protein: 7.1 g/dL (ref 6.0–8.5)
eGFR: 75 mL/min/{1.73_m2} (ref >59)

## 2020-08-04 LAB — VITAMIN D 25 HYDROXY (VIT D DEFICIENCY, FRACTURES): Vit D, 25-Hydroxy: 18.4 ng/mL — ABNORMAL LOW (ref 30.0–100.0)

## 2020-08-04 LAB — COPPER, SERUM: Copper: 115 ug/dL (ref 69–132)

## 2020-08-04 LAB — METHYLMALONIC ACID, SERUM: Methylmalonic Acid: 155 nmol/L (ref 0–378)

## 2020-08-04 LAB — SEDIMENTATION RATE: Sed Rate: 3 mm/hr (ref 0–15)

## 2020-08-04 LAB — HOMOCYSTEINE: Homocysteine: 22 umol/L — ABNORMAL HIGH (ref 0.0–14.5)

## 2020-08-04 LAB — C-REACTIVE PROTEIN: CRP: 1 mg/L (ref 0–10)

## 2020-08-04 LAB — FOLATE: Folate: 5.1 ng/mL (ref >3.0)

## 2020-08-04 LAB — VITAMIN B12: Vitamin B-12: 447 pg/mL (ref 232–1245)

## 2020-08-04 LAB — HGB A1C W/O EAG: Hgb A1c MFr Bld: 7.4 % — ABNORMAL HIGH (ref 4.8–5.6)

## 2020-08-04 LAB — TSH: TSH: 1.28 u[IU]/mL (ref 0.450–4.500)

## 2020-08-04 NOTE — Telephone Encounter (Signed)
Scheduled appt per 7/8 sch msg. Pt aware.

## 2020-08-06 NOTE — Progress Notes (Signed)
Patient Care Team: Maximiano Coss, NP as PCP - General (Adult Health Nurse Practitioner)  DIAGNOSIS:    ICD-10-CM   1. Neutropenia, unspecified type ([place])  D70.9       CHIEF COMPLIANT:  Follow-up of leukopenia  INTERVAL HISTORY: Victor Christensen is a 50 y.o. with above-mentioned history of neuromyelitis and recent leukopenia. Labs on 08/01/20 WBC 2.1, RBC 5.22, Hg 12.6, HCT 40.8, MCV 78, and PLT 219. He presents to the clinic today to review his labs.  He was started back on CellCept about 6 months ago at a lower dosage but in spite of that his white count went down again.  Therefore they discontinued the CellCept.  ALLERGIES:  is allergic to oxycodone-acetaminophen.  MEDICATIONS:  Current Outpatient Medications  Medication Sig Dispense Refill   Cholecalciferol (VITAMIN D3 PO) Take 2,000 Units by mouth daily.     diazepam (VALIUM) 5 MG tablet Take 1-2 tablets 30 minutes prior to MRI, may repeat once as needed. Must have driver. 3 tablet 0   DULoxetine (CYMBALTA) 60 MG capsule Take 1 capsule (60 mg total) by mouth daily. 30 capsule 11   gabapentin (NEURONTIN) 300 MG capsule Take 4 capsules (1,200 mg total) by mouth 3 (three) times daily. Please call [phone] to schedule appt for continued refills. 360 capsule 11   metFORMIN (GLUCOPHAGE) 500 MG tablet Take 1 tablet (500 mg total) by mouth 3 (three) times daily with meals. 270 tablet 1   mycophenolate (CELLCEPT) 500 MG tablet Take 1 tablet (500 mg total) by mouth 2 (two) times daily. 180 tablet 4   vitamin B-12 (CYANOCOBALAMIN) 1000 MCG tablet Take 1,000 mcg by mouth daily.     No current facility-administered medications for this visit.    PHYSICAL EXAMINATION: ECOG PERFORMANCE STATUS: 1 - Symptomatic but completely ambulatory  Vitals:   08/07/20 0943  BP: 117/74  Pulse: 98  Resp: 19  Temp: 97.6 F (36.4 C)  SpO2: 99%   Filed Weights   08/07/20 0943  Weight: 212 lb (96.2 kg)    LABORATORY DATA:  I have reviewed the data  as listed CMP Latest Ref Rng & Units 08/01/2020 10/06/2019 09/16/2018  Glucose 65 - 99 mg/dL 144(H) 87 122(H)  BUN 6 - 24 mg/dL 13 10 13   Creatinine 0.76 - 1.27 mg/dL 1.20 1.09 1.21  Sodium 134 - 144 mmol/L 140 138 138  Potassium 3.5 - 5.2 mmol/L 4.7 4.6 4.7  Chloride 96 - 106 mmol/L 101 100 100  CO2 20 - 29 mmol/L 26 24 23   Calcium 8.7 - 10.2 mg/dL 9.7 9.7 10.0  Total Protein 6.0 - 8.5 g/dL 7.1 7.6 7.4  Total Bilirubin 0.0 - 1.2 mg/dL 0.3 0.4 0.4  Alkaline Phos 44 - 121 IU/L 47 52 48  AST 0 - 40 IU/L 19 22 23   ALT 0 - 44 IU/L 21 29 26     Lab Results  Component Value Date   WBC 2.1 (LL) 08/01/2020   HGB 12.6 (L) 08/01/2020   HCT 40.8 08/01/2020   MCV 78 (L) 08/01/2020   PLT 219 08/01/2020   NEUTROABS 1.0 (L) 08/01/2020    ASSESSMENT & PLAN:  Neutropenia ([place]) Neutropenia started 02/05/2018.  He was previously in [place] and moved to [place] for his wife. Lab review: 05/17/2019: WBC 3, hemoglobin 13.4, ANC 1.5 (it was 1.2 on 04/19/2019) B12: 181 ANA: Negative Folic acid: 6.1, LDH: 938 09/06/2019: WBC 4.5, hemoglobin 12.6, platelets 277 03/09/19:WBC: 3.9, Hb 12.5, ANC 2.2 08/01/2020: WBC 2.1, hemoglobin 12.6,  platelets 219, ANC 1   Current treatment: B12 supplementation 5000 mcg sublingually daily I discussed with him that the cause of the neutropenia is CellCept Since he stopped it I suspect that he will improve.   Follows with Dr. Krista Blue.  With rheumatology   65-monthfollow-up with recheck of his labs    No orders of the defined types were placed in this encounter.  The patient has a good understanding of the overall plan. he agrees with it. he will call with any problems that may develop before the next visit here.  Total time spent: 20 mins including face to face time and time spent for planning, charting and coordination of care  VRulon Eisenmenger MD, MPH 08/07/2020  I, KThana Ates am acting as scribe for Dr. VNicholas Lose  I have reviewed the above documentation for  accuracy and completeness, and I agree with the above.

## 2020-08-07 ENCOUNTER — Inpatient Hospital Stay: Payer: Medicare PPO | Attending: Hematology and Oncology | Admitting: Hematology and Oncology

## 2020-08-07 ENCOUNTER — Other Ambulatory Visit: Payer: Self-pay

## 2020-08-07 DIAGNOSIS — D709 Neutropenia, unspecified: Secondary | ICD-10-CM | POA: Insufficient documentation

## 2020-08-07 DIAGNOSIS — Z79899 Other long term (current) drug therapy: Secondary | ICD-10-CM | POA: Insufficient documentation

## 2020-08-07 DIAGNOSIS — Z7984 Long term (current) use of oral hypoglycemic drugs: Secondary | ICD-10-CM | POA: Insufficient documentation

## 2020-08-07 NOTE — Assessment & Plan Note (Signed)
Neutropenia started 02/05/2018. He was previously in [place] and moved to [place] for his wife. Lab review: 05/17/2019: WBC 3, hemoglobin 13.4, ANC 1.5 (it was 1.2 on 04/19/2019) B12: 181 ANA: Negative Folic acid: 1.6,XWR: 604 09/06/2019: WBC 4.5, hemoglobin 12.6, platelets 277 03/09/19:WBC: 3.9, Hb 12.5, ANC 2.2 08/01/2020: WBC 2.1, hemoglobin 12.6, platelets 219, ANC 1  Current treatment: B12 supplementation5000 mcg sublingually daily Either because of the CellCept or because of B12 supplementation his white count has improved. Recent UTI (gets once a year) Follows with Dr.Yan.With rheumatology   RTC in 1 year

## 2020-08-20 ENCOUNTER — Other Ambulatory Visit: Payer: Self-pay

## 2020-08-20 ENCOUNTER — Ambulatory Visit
Admission: RE | Admit: 2020-08-20 | Discharge: 2020-08-20 | Disposition: A | Discharge status: Home / Self Care | Payer: Medicare PPO | Source: Ambulatory Visit | Attending: Neurology | Admitting: Neurology

## 2020-08-20 DIAGNOSIS — R202 Paresthesia of skin: Secondary | ICD-10-CM

## 2020-08-20 DIAGNOSIS — G369 Acute disseminated demyelination, unspecified: Secondary | ICD-10-CM | POA: Diagnosis not present

## 2020-08-20 DIAGNOSIS — E538 Deficiency of other specified B group vitamins: Secondary | ICD-10-CM

## 2020-08-20 MED ORDER — GADOBENATE DIMEGLUMINE 529 MG/ML IV SOLN
19.0000 mL | Freq: Once | INTRAVENOUS | Status: AC | PRN
  Administered 2020-08-20: 19 mL via INTRAVENOUS

## 2020-08-21 ENCOUNTER — Telehealth: Payer: Self-pay | Admitting: Neurology

## 2020-08-21 NOTE — Telephone Encounter (Signed)
IMPRESSION: This MRI of the cervical spine with and without contrast shows the following: 1.   There is mild spinal cord atrophy associated with patchy T2 hyperintense signal.  The overall appearance is similar to the 2020 MRI.  No enhancing lesions in the spinal cord. 2.   At C4-C5, there are right greater than left disc osteophyte complexes causing moderately severe right foraminal narrowing and mild spinal stenosis.  There is potential for right C5 nerve root compression. 3.   At C5-C6, there are degenerative changes causing mild foraminal narrowing and mild spinal stenosis but no nerve root compression. 4.   At C6-C7, there is a large disc osteophyte complex to the left causing moderate left foraminal narrowing and mild spinal stenosis.  There is some potential for right C7 nerve root compression. 5.   Degenerative changes are essentially unchanged compared to the 2020 MRI. 6.   Normal enhancement pattern.  Please call patient, MRI of cervical spine continue to show degenerative changes, which is no change compared to previous MRI scan in 2020, there was no evidence of new spinal cord lesion  If he still have significant right shoulder pain, may consider orthopedic evaluation, referral is usually from the primary care physician.

## 2020-08-22 NOTE — Addendum Note (Signed)
Addended by: Noberto Retort C on: 08/22/2020 09:02 AM   Modules accepted: Orders

## 2020-08-22 NOTE — Telephone Encounter (Signed)
I spoke to the patient and provided him with the MRI cervical results. He is agreeable to seek further evaluation with orthopaedics for his shoulder pain. He has stopped duloxetine 33m due to diarrhea and insomnia.

## 2020-08-30 DIAGNOSIS — M25511 Pain in right shoulder: Secondary | ICD-10-CM | POA: Diagnosis not present

## 2020-09-07 ENCOUNTER — Telehealth: Payer: Self-pay | Admitting: *Deleted

## 2020-09-07 NOTE — Telephone Encounter (Signed)
PA for gabapentin, quantity override, started on covermymeds (key: [id number]). Pt has pharmacy coverage with [employer] [phone]). Decsion pending.

## 2020-09-07 NOTE — Telephone Encounter (Signed)
PA approved through 01/27/21.

## 2020-09-08 DIAGNOSIS — Z20822 Contact with and (suspected) exposure to covid-19: Secondary | ICD-10-CM | POA: Diagnosis not present

## 2020-10-23 DIAGNOSIS — R3912 Poor urinary stream: Secondary | ICD-10-CM | POA: Diagnosis not present

## 2020-10-23 DIAGNOSIS — R3914 Feeling of incomplete bladder emptying: Secondary | ICD-10-CM | POA: Diagnosis not present

## 2020-10-23 DIAGNOSIS — N200 Calculus of kidney: Secondary | ICD-10-CM | POA: Diagnosis not present

## 2020-11-04 NOTE — Progress Notes (Signed)
Patient Care Team: Maximiano Coss, NP as PCP - General (Adult Health Nurse Practitioner)  DIAGNOSIS:    ICD-10-CM   1. Neutropenia, unspecified type (Victor Christensen)  D70.9       CHIEF COMPLIANT: Follow-up of leukopenia  INTERVAL HISTORY: Victor Christensen is a 50 y.o. with above-mentioned history of neuromyelitis and recent leukopenia. He presents to the clinic today to review his labs.  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  His upper extremity strength is still intact.  He is here in a wheelchair.  He denies any frequent illnesses or infections.  ALLERGIES:  is allergic to duloxetine and oxycodone-acetaminophen.  MEDICATIONS:  Current Outpatient Medications  Medication Sig Dispense Refill   Cholecalciferol (VITAMIN D3 PO) Take 2,000 Units by mouth daily.     diazepam (VALIUM) 5 MG tablet Take 1-2 tablets 30 minutes prior to MRI, may repeat once as needed. Must have driver. 3 tablet 0   gabapentin (NEURONTIN) 300 MG capsule Take 4 capsules (1,200 mg total) by mouth 3 (three) times daily. Please call [phone] to schedule appt for continued refills. 360 capsule 11   metFORMIN (GLUCOPHAGE) 500 MG tablet Take 1 tablet (500 mg total) by mouth 3 (three) times daily with meals. 270 tablet 1   mycophenolate (CELLCEPT) 500 MG tablet Take 1 tablet (500 mg total) by mouth 2 (two) times daily. 180 tablet 4   vitamin B-12 (CYANOCOBALAMIN) 1000 MCG tablet Take 1,000 mcg by mouth daily.     No current facility-administered medications for this visit.    PHYSICAL EXAMINATION: ECOG PERFORMANCE STATUS: 1 - Symptomatic but completely ambulatory  Vitals:   11/06/20 0955  BP: 99/68  Pulse: 93  Resp: 18  Temp: 97.9 F (36.6 C)  SpO2: 99%   Filed Weights   11/06/20 0955  Weight: 212 lb 12.8 oz (96.5 kg)    LABORATORY DATA:  I have reviewed the data as listed CMP Latest Ref Rng & Units 08/01/2020 10/06/2019 09/16/2018  Glucose 65 - 99 mg/dL 144(H) 87 122(H)  BUN  6 - 24 mg/dL 13 10 13   Creatinine 0.76 - 1.27 mg/dL 1.20 1.09 1.21  Sodium 134 - 144 mmol/L 140 138 138  Potassium 3.5 - 5.2 mmol/L 4.7 4.6 4.7  Chloride 96 - 106 mmol/L 101 100 100  CO2 20 - 29 mmol/L 26 24 23   Calcium 8.7 - 10.2 mg/dL 9.7 9.7 10.0  Total Protein 6.0 - 8.5 g/dL 7.1 7.6 7.4  Total Bilirubin 0.0 - 1.2 mg/dL 0.3 0.4 0.4  Alkaline Phos 44 - 121 IU/L 47 52 48  AST 0 - 40 IU/L 19 22 23   ALT 0 - 44 IU/L 21 29 26     Lab Results  Component Value Date   WBC 2.2 (L) 11/06/2020   HGB 12.6 (L) 11/06/2020   HCT 40.6 11/06/2020   MCV 80.9 11/06/2020   PLT 185 11/06/2020   NEUTROABS 1.1 (L) 11/06/2020    ASSESSMENT & PLAN:  Neutropenia ([place]) Neutropenia started 02/05/2018.  He was previously in [place] and moved to [place] for his wife. Lab review: 05/17/2019: WBC 3, hemoglobin 13.4, ANC 1.5 (it was 1.2 on 04/19/2019) B12: 181 ANA: Negative Folic acid: 6.1, LDH: 865 09/06/2019: WBC 4.5, hemoglobin 12.6, platelets 277 03/09/19:WBC: 3.9, Hb 12.5, ANC 2.2 08/01/2020: WBC 2.1, hemoglobin 12.6, platelets 219, ANC 1 11/06/2020: WBC 2.2, hemoglobin 12.6, ANC 1.1, platelets 185   Current treatment: B12 supplementation 1000 mcg sublingually daily I discussed with him that  the cause of the neutropenia could be autoimmune versus cyclical versus ethnicity related He has not had any frequent infections.    Follows with Neurology for neurological disorder that caused his lower extremity weakness   1 year follow-up with recheck of his labs    No orders of the defined types were placed in this encounter.  The patient has a good understanding of the overall plan. he agrees with it. he will call with any problems that may develop before the next visit here.  Total time spent: 20 mins including face to face time and time spent for planning, charting and coordination of care  Rulon Eisenmenger, MD, MPH 11/06/2020  I, Thana Ates, am acting as scribe for Dr. Nicholas Lose.  I have  reviewed the above documentation for accuracy and completeness, and I agree with the above.

## 2020-11-06 ENCOUNTER — Inpatient Hospital Stay: Payer: Medicare PPO

## 2020-11-06 ENCOUNTER — Other Ambulatory Visit: Payer: Self-pay

## 2020-11-06 ENCOUNTER — Inpatient Hospital Stay: Payer: Medicare PPO | Attending: Hematology and Oncology | Admitting: Hematology and Oncology

## 2020-11-06 DIAGNOSIS — D709 Neutropenia, unspecified: Secondary | ICD-10-CM | POA: Insufficient documentation

## 2020-11-06 DIAGNOSIS — Z7984 Long term (current) use of oral hypoglycemic drugs: Secondary | ICD-10-CM | POA: Insufficient documentation

## 2020-11-06 DIAGNOSIS — Z79899 Other long term (current) drug therapy: Secondary | ICD-10-CM | POA: Insufficient documentation

## 2020-11-06 LAB — CBC WITH DIFFERENTIAL (CANCER CENTER ONLY)
Abs Immature Granulocytes: 0.01 10*3/uL (ref 0.00–0.07)
Basophils Absolute: 0 10*3/uL (ref 0.0–0.1)
Basophils Relative: 1 %
Eosinophils Absolute: 0.1 10*3/uL (ref 0.0–0.5)
Eosinophils Relative: 5 %
HCT: 40.6 % (ref 39.0–52.0)
Hemoglobin: 12.6 g/dL — ABNORMAL LOW (ref 13.0–17.0)
Immature Granulocytes: 1 %
Lymphocytes Relative: 30 %
Lymphs Abs: 0.7 10*3/uL (ref 0.7–4.0)
MCH: 25.1 pg — ABNORMAL LOW (ref 26.0–34.0)
MCHC: 31 g/dL (ref 30.0–36.0)
MCV: 80.9 fL (ref 80.0–100.0)
Monocytes Absolute: 0.3 10*3/uL (ref 0.1–1.0)
Monocytes Relative: 14 %
Neutro Abs: 1.1 10*3/uL — ABNORMAL LOW (ref 1.7–7.7)
Neutrophils Relative %: 49 %
Platelet Count: 185 10*3/uL (ref 150–400)
RBC: 5.02 MIL/uL (ref 4.22–5.81)
RDW: 14.7 % (ref 11.5–15.5)
WBC Count: 2.2 10*3/uL — ABNORMAL LOW (ref 4.0–10.5)
nRBC: 0 % (ref 0.0–0.2)

## 2020-11-06 NOTE — Assessment & Plan Note (Signed)
Neutropenia started 02/05/2018. He was previously in [place] and moved to [place] for his wife. Lab review: 05/17/2019: WBC 3, hemoglobin 13.4, ANC 1.5 (it was 1.2 on 04/19/2019) B12: 181 ANA: Negative Folic acid: 9.3,TTS: 177 09/06/2019: WBC 4.5, hemoglobin 12.6, platelets 277 03/09/19:WBC: 3.9, Hb 12.5, ANC 2.2 08/01/2020: WBC 2.1, hemoglobin 12.6, platelets 219, ANC 1  Current treatment: B12 supplementation5000 mcg sublingually daily I discussed with him that the cause of the neutropenia is CellCept Since he stopped it I suspect that he will improve.  Follows with Dr.Yan.With rheumatology   29-monthfollow-up with recheck of his labs

## 2020-11-28 ENCOUNTER — Telehealth: Payer: Self-pay | Admitting: Neurology

## 2020-11-28 NOTE — Telephone Encounter (Signed)
Pt called stating that he is having a lot of numbness and tingling in his arms and hands. Pt asked if he can be seen soon but nothing with RN nor MD was available soon enough. Pt would like to discuss with RN.

## 2020-11-28 NOTE — Telephone Encounter (Signed)
I called patient. He is having tingling in his arms and hands again. His PCP put him on prednisone for this and it helped but the symptoms came back after he finished the prednisone. He has had paresthesias addressed with Dr. Krista Blue at the last visit.  I offered him an appointment with Dr. Krista Blue today at 3pm but he declined. I will place him on the waitlist for Dr. Krista Blue but he can be seen by her or the NPs.

## 2020-11-30 DIAGNOSIS — M7551 Bursitis of right shoulder: Secondary | ICD-10-CM | POA: Diagnosis not present

## 2020-11-30 DIAGNOSIS — M79641 Pain in right hand: Secondary | ICD-10-CM | POA: Diagnosis not present

## 2020-12-11 NOTE — Progress Notes (Signed)
Established Patient Office Visit  Subjective:  Patient ID: Victor Christensen, male    DOB: 23-May-1970  Age: 50 y.o. MRN: [id number]  CC:  Chief Complaint  Patient presents with   Follow-up    Patient states he is here for 6 month follow up for diabetes.    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[place]     Past Surgical History:  Procedure Laterality Date   CYSTOSCOPY WITH STENT PLACEMENT Left 01/15/2018   Procedure: CYSTOSCOPY WITH STENT PLACEMENT retrograde pylegram;  Surgeon: DFranchot Gallo MD;  Location: [facility];  Service: Urology;  Laterality: Left;   CYSTOSCOPY/URETEROSCOPY/HOLMIUM LASER/STENT PLACEMENT Left 02/05/2018   Procedure: CYSTOSCOPY/URETEROSCOPY/HOLMIUM LASER/STENT EXTRACTION WITH LEFT STENT PLACEMENT;  Surgeon: DFranchot Gallo MD;  Location: [facility];  Service: Urology;  Laterality: Left;    Family History  Problem Relation Age of Onset   Hypertension Mother    Hypertension Father    Diabetes Father     Social History   Socioeconomic History   Marital status: Married    Spouse name: Not on file   Number of children: 2   Years of education: college   Highest education level: Bachelor's degree (e.g., BA, AB, BS)  Occupational History   Occupation: Disabled  Tobacco Use   Smoking status: Never   Smokeless tobacco: Never  Vaping Use   Vaping Use: Never used  Substance and Sexual Activity   Alcohol use: Not Currently    Comment: social   Drug use: Never   Sexual activity: Not Currently  Other Topics Concern   Not on file  Social History Narrative   Lives at home with his wife  and son.   Right-handed.   Moderate caffeine use.   Social Determinants of Health   Financial Resource Strain: Not on file  Food Insecurity: Not on file  Transportation Needs: Not on file  Physical Activity: Not on file  Stress: Not on file  Social Connections: Not on file  Intimate Partner Violence: Not on file    Outpatient Medications Prior to Visit  Medication Sig Dispense Refill   Cholecalciferol (VITAMIN D3 PO) Take 2,000 Units by mouth daily.     cyclobenzaprine (FLEXERIL) 10 MG tablet Take 1 tablet 3 times daily as needed for neck pain. 30 tablet 0   diclofenac (VOLTAREN) 75 MG EC tablet Take 1 tablet (75 mg total) by mouth 2 (two) times daily. 30 tablet 0   gabapentin (NEURONTIN) 300 MG capsule Take 3 capsules (900 mg total) by mouth 3 (three) times daily. 270 capsule 11   metFORMIN (GLUCOPHAGE) 500 MG tablet Take 1 tablet (500 mg total) by mouth 3 (three) times daily with meals. 270 tablet 1   mycophenolate (CELLCEPT) 500 MG tablet Take 1 tablet (500 mg total) by mouth 2 (two) times daily. 180 tablet 4   predniSONE (DELTASONE) 20 MG tablet Take 3 pills daily for 2 days, then 2 daily for 2 days, then 1 daily for 2 days for neck pain 12 tablet 0   No facility-administered medications prior to visit.    Allergies  Allergen Reactions   Duloxetine Diarrhea and Other (See Comments)    insomnia   Oxycodone-Acetaminophen Hives and Other (See Comments)    Makes him loopy     ROS Review of Systems  Constitutional: Negative.   HENT: Negative.    Eyes: Negative.   Respiratory: Negative.    Cardiovascular: Negative.   Gastrointestinal: Negative.   Genitourinary: Negative.   Musculoskeletal: Negative.   Skin: Negative.   Neurological: Negative.   Psychiatric/Behavioral: Negative.    All other systems reviewed and are negative.    Objective:    Physical Exam Constitutional:      General: He is not in acute distress.    Appearance: Normal appearance. He is normal  weight. He is not ill-appearing, toxic-appearing or diaphoretic.  Cardiovascular:     Rate and Rhythm: Normal rate and regular rhythm.     Heart sounds: Normal heart sounds. No murmur heard.   No friction rub. No gallop.  Pulmonary:     Effort: Pulmonary effort is normal. No respiratory distress.     Breath sounds: Normal breath sounds. No stridor. No wheezing, rhonchi or rales.  Chest:     Chest wall: No tenderness.  Neurological:     General: No focal deficit present.     Mental Status: He is alert and oriented to person, place, and time. Mental status is at baseline.  Psychiatric:        Mood and Affect: Mood normal.        Behavior: Behavior normal.        Thought Content: Thought content normal.        Judgment: Judgment normal.    BP 102/68   Pulse 85   Temp 98 F (36.7 C) (Temporal)   Resp 18   Ht 5' 11"  (1.803 m)   SpO2 98%   BMI 32.78 kg/m  Wt Readings from Last 3 Encounters:  11/06/20 212 lb 12.8 oz (96.5 kg)  08/07/20 212 lb (96.2 kg)  10/06/19 235 lb (106.6 kg)     Health Maintenance Due  Topic Date Due   Pneumococcal Vaccine 31-83 Years old (1 - PCV) Never done   URINE MICROALBUMIN  Never done   TETANUS/TDAP  Never done   COVID-19 Vaccine (4 - Booster for Pfizer series) 03/21/2020   OPHTHALMOLOGY EXAM  04/28/2020   INFLUENZA VACCINE  Never done    There are no preventive care reminders to display for this patient.  Lab Results  Component Value Date   TSH 1.280 08/01/2020   Lab Results  Component Value Date   WBC 2.2 (L) 11/06/2020   HGB 12.6 (L) 11/06/2020   HCT 40.6 11/06/2020   MCV 80.9 11/06/2020   PLT 185 11/06/2020   Lab Results  Component Value Date   NA 140 08/01/2020   K 4.7 08/01/2020   CO2 26 08/01/2020   GLUCOSE 144 (H) 08/01/2020   BUN 13 08/01/2020   CREATININE 1.20 08/01/2020   BILITOT 0.3 08/01/2020   ALKPHOS 47 08/01/2020   AST 19 08/01/2020   ALT 21 08/01/2020   PROT 7.1 08/01/2020   ALBUMIN 4.5 08/01/2020    CALCIUM 9.7 08/01/2020   ANIONGAP 8 02/05/2018   EGFR 75 08/01/2020   Lab Results  Component Value Date   CHOL 136 10/06/2019   Lab Results  Component Value Date   HDL 34 (L) 10/06/2019   Lab Results  Component Value Date   LDLCALC 78 10/06/2019   Lab Results  Component Value Date  TRIG 132 10/06/2019   Lab Results  Component Value Date   CHOLHDL 4.0 10/06/2019   Lab Results  Component Value Date   HGBA1C 7.4 (H) 08/01/2020      Assessment & Plan:   Problem List Items Addressed This Visit       Endocrine   Type 2 diabetes mellitus without complication, without long-term current use of insulin ([place]) - Primary   Relevant Orders   POCT glycosylated hemoglobin (Hb A1C) (Completed)    No orders of the defined types were placed in this encounter.   Follow-up: No follow-ups on file.   PLAN A1c today at 7.0, goal is >7. Continue current regimen. Discussed lifestyle control of t2dm Return in 6 mo Patient encouraged to call clinic with any questions, comments, or concerns.  Maximiano Coss, NP

## 2021-01-09 ENCOUNTER — Ambulatory Visit: Payer: Medicare PPO | Admitting: Nurse Practitioner

## 2021-02-01 ENCOUNTER — Other Ambulatory Visit: Payer: Self-pay

## 2021-02-01 ENCOUNTER — Encounter: Payer: Self-pay | Admitting: Neurology

## 2021-02-01 ENCOUNTER — Ambulatory Visit: Payer: Medicare PPO | Admitting: Neurology

## 2021-02-01 VITALS — BP 118/78 | HR 84

## 2021-02-01 DIAGNOSIS — Z9229 Personal history of other drug therapy: Secondary | ICD-10-CM

## 2021-02-01 DIAGNOSIS — Z993 Dependence on wheelchair: Secondary | ICD-10-CM | POA: Diagnosis not present

## 2021-02-01 DIAGNOSIS — G369 Acute disseminated demyelination, unspecified: Secondary | ICD-10-CM | POA: Diagnosis not present

## 2021-02-01 DIAGNOSIS — E538 Deficiency of other specified B group vitamins: Secondary | ICD-10-CM | POA: Diagnosis not present

## 2021-02-01 DIAGNOSIS — D72819 Decreased white blood cell count, unspecified: Secondary | ICD-10-CM | POA: Diagnosis not present

## 2021-02-01 NOTE — Progress Notes (Addendum)
Chief Complaint  Patient presents with   Follow-up    Rm 15, alone,  States he is doing well, new rx needed for wheelchair       ASSESSMENT AND PLAN  Victor Christensen is a 51 y.o. male   History of neuromyelitis since 2012, involving cervical and thoracic spinal cord  He was on CellCept 500 mg 2 tablets twice a day, due to leukopenia , which did improve after holding CellCept for a while, was seen by hematologist Dr. Lindi Adie, work-up did not reveal etiology, repeat laboratory evaluation continue to show leukopenia, despite has stopped taking CellCept since June 2022,  There is no worsening of his symptoms after stopped taking CellCept, we had extensive discussion about potential side effect of CellCept, and also the fact he might experience flareup of his myelitis stop immunosuppressive treatment, patient understand the potential risk and benefit, decided to continue holding off CellCept treatment,  Right shoulder pain  Right rotator cuff, some improvement with orthopedic surgeon treatment  Opthalmologic Dr Katy Fitch evaluation on Jun 14 2021:Dry eye syndrome OU, No ocular complication from DM, Lattice degeneration of retina OU, s/p laser retinopexy OU by Dr. Zigmund Daniel,  pssible small rAPD OS,   DIAGNOSTIC DATA (LABS, IMAGING, TESTING) - I reviewed patient records, labs, notes, testing and imaging myself where available.   HISTORICAL Victor Christensen is a 50 year old male, accompanied by his wife Victor Christensen, seen in request by previous neurologist Dr. 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   I have reviewed and summarized the referring note from the referring physician.  He had a past medical history of diabetes since 2001, taking Metformin   In 2012, he presented with subacute onset of numbness upper and lower extremity, gait abnormality, incontinence, there was no visual loss   MRI of brain was negative, cervical spine with and  without contrast showed contrast-enhancing cervical lesions, thoracic spine also showed contrast-enhancing upper cord edema,   CSF showed WBC of 215, protein of 258, 2 oligoclonal banding, IgG index was mildly elevated 0.7, serum NML antibody was negative, visual evoked potential was negative   He was diagnosed with neuromyelitis, was treated with rituximab 1000 mg IV on June 2012, August 07, 2010, January 2013, August 2013, February 2014, with treatment, he was able to regain some function, able to ambulate with walker, upper extremity strength mostly recovered.   While taking rituximab infusion, he had a few flareups, presented with worsening bilateral lower extremity weakness, sensory changes, eventually lost mobility in April 2014, become wheelchair-bound, he was seen at [facility] in 2014, was put on CellCept 500 mg 2 tablets twice a day ever since 2014.   He had another bouts of flareup in 2016, presented with worsening lower extremity neuropathic pain, He was treated with IV Solu-Medrol in October 2016, followed by plasma exchange in October 2016, with significant improvement of his lower extremity neuropathic pain   He has been on stable dose of CellCept 500 mg 2 tablets twice a day since.   He spent most of the day in wheelchair, has fingertips paresthesia, mild hand grip weakness, but overall upper extremity function well, able to transfer himself in and out of wheelchair, driving with a modified car, keep a bowel regimen, wear depends.   UPDATE Mar 22 2019: Record from [facility] on October 22, 2014, MRI of cervical spine with and without contrast, there is patchy signal abnormality, extends from the cervical medullary junction eccentric towards  the left, along the central and dorsal aspect of the cord C2, 3, right dorsal cord C6, and patchy signal abnormality at C7, upper thoracic cord, on the sagittal STIR imagings or thoracic spinal cord, there are diffuse patchy signal  abnormality in the cord, and mild diffuse volume loss, there is irregular linear band of enhancement cervical spinal cord extending from posterior cervical medullary junction to the C6 level, also at T1-T2, T2-T3 level finding appear to progress since most recent MRI cervical spine in 2015, but similar to more remote cervical spine imaging in 2014, no additional enhancing abnormality identified in the remainder of the thoracic spinal cord,Tiny disc protrusion at T3-T4,   I personally reviewed MRI of spines with without contrast in September 2020 There is generalized atrophy of cervical and upper thoracic spine, with patchy signal changes, correlate with remote insult in this patient, Cord: Diffusely small cord measuring only 3-4 mm anterior to posterior at the cervicothoracic junction. Cord also has heterogeneous signal on T2 and STIR imaging, attributed to gliosis from prior demyelination. No expansion/swelling and no abnormal enhancement.   MRI of the brain was normal   Laboratory evaluation in August 2020 showed negative NMO  antibody, mild elevated A1c 7.1, CMP showed mild elevated glucose 122, creatinine of 1.21, normal or negative ANA, HIV, RPR, copper, ferritin, vitamin D level was 13.7, decreased WBC of 2.9, ESR was mildly elevated 26, normal CPK, TSH, C-reactive protein, B12,   UPDATE July 19 2019: He was noted to have decreased WBC 2.8 in laboratory evaluation, CellCept was stopped at the end of March 2021, was referred to hematologist,   I was able to review his hematology evaluation by Dr. Lindi Adie on May 17, 2019, neutropenia, WBC of 2.8, ANC of 1.2, noticed neutropenia since January 2019, repeat laboratory evaluation showed WBC of 3, ANC was 1.5, B12 was 181, negative ANA, folic acid 6.1, LDH of 150   Based on these laboratory evaluations, B12 deficiency is likely account for his neutropenia and, hematologist Dr.  Lindi Adie  have suggested resume CellCept if it is necessary   I also  talked with his dermatologist Dr. 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, suspicious for cutaneous sarcoidosis, low likelihood of infectious disease, per patient, chest x-ray was negative, culture was negative, he was given steroid cream, which has helped his symptoms   Since CellCept was stopped in March 2021, most recent CBC was on May 17, 2019, WBC was 3.0,   UPDATE July 5th 2022: He is accompanied by his wife at today's visit, complains of increased right shoulder pain since beginning of 2022, he also noticed significant tenderness at upper biceps region, he cannot clearly identify a trigger event, but he does like to throw  ball using his right hand, he workout his upper extremity regularly, rely on his shoulder and arm to transfer in and out of wheelchair, over the past 6 months, his right shoulder and arm discomfort consistent, he noticed more than right hand numbness tingling, mild right-sided neck pain,  He was given prednisone tapering dose for 1 week, it does help him some  He is now on CellCept 500 mg 1 tablet twice a day, previously when he was on higher dose 2 tablets twice a day, there was noticeable leukopenia, CellCept was on hold, he was seen by hematologist, no etiology was found, it was okay per hematology to restart CellCept, which she is now taking 500 mg twice daily  since June 2021, he denies significant ill effect  UPDATE Feb 01 2021: He has stopped taking CellCept given low-dose 500 mg twice a day since last visit in July 2022, no flareup of his monitor his symptoms, right shoulder was diagnosed with right rotator cuff, improved with orthopedic care including intra-articular injection  He is overall doing very well, since stopped taking CellCept, he no longer have body achy pain, not taking gabapentin anymore,  PHYSICAL EXAM:   Vitals:   02/01/21 0741  BP: 118/78  Pulse: 84   Not recorded      There is no height or weight on file to calculate BMI.  PHYSICAL EXAMNIATION:  Gen: NAD, conversant, well nourised, well groomed         NEUROLOGICAL EXAM:  MENTAL STATUS: Speech/cognition Awake, alert, oriented to history taking and casual conversation CRANIAL NERVES: CN II: Visual fields are full to confrontation. Pupils are round equal and briskly reactive to light. CN III, IV, VI: extraocular movement are normal. No ptosis. CN V: Facial sensation is intact to light touch CN VII: Face is symmetric with normal eye closure  CN VIII: Hearing is normal to causal conversation. CN IX, X: Phonation is normal. CN XI: Head turning and shoulder shrug are intact  MOTOR: Right shoulder pain, mild limitation, no significant bilateral upper extremity proximal and distal weakness,  No movement at lower extremity  REFLEXES: Reflexes are 1 and symmetric at the biceps, triceps, 3/3 knees,   SENSORY: Sensory level to navel   COORDINATION: There is no trunk or limb dysmetria noted.  GAIT/STANCE: Deferred REVIEW OF SYSTEMS:  Full 14 system review of systems performed and notable only for as above All other review of systems were negative.   ALLERGIES: Allergies  Allergen Reactions   Duloxetine Diarrhea and Other (See Comments)    insomnia   Oxycodone-Acetaminophen Hives and Other (See Comments)    Makes him loopy     HOME MEDICATIONS: Current Outpatient Medications  Medication Sig Dispense Refill   Cholecalciferol (VITAMIN D3 PO) Take 2,000 Units by mouth daily.     diazepam (VALIUM) 5 MG tablet Take 1-2 tablets 30 minutes prior to MRI, may repeat once as needed. Must have driver. 3 tablet 0   gabapentin (NEURONTIN) 300 MG capsule Take 4 capsules (1,200 mg total) by mouth 3 (three) times daily. Please call [phone] to schedule appt for continued refills. 360 capsule 11   metFORMIN (GLUCOPHAGE) 500 MG tablet Take 1 tablet (500 mg total) by mouth 3 (three) times daily with  meals. 270 tablet 1   vitamin B-12 (CYANOCOBALAMIN) 1000 MCG tablet Take 1,000 mcg by mouth daily.     No current facility-administered medications for this visit.    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[place])     PAST SURGICAL HISTORY: Past Surgical History:  Procedure Laterality Date   CYSTOSCOPY WITH STENT PLACEMENT Left 01/15/2018   Procedure: CYSTOSCOPY WITH STENT PLACEMENT retrograde pylegram;  Surgeon: Franchot Gallo, MD;  Location: [facility];  Service: Urology;  Laterality: Left;   CYSTOSCOPY/URETEROSCOPY/HOLMIUM LASER/STENT PLACEMENT Left 02/05/2018   Procedure: CYSTOSCOPY/URETEROSCOPY/HOLMIUM LASER/STENT EXTRACTION WITH LEFT STENT PLACEMENT;  Surgeon: Franchot Gallo, MD;  Location: [facility];  Service: Urology;  Laterality: Left;    FAMILY HISTORY: Family History  Problem Relation Age of Onset   Hypertension Mother    Hypertension Father  Diabetes Father     SOCIAL HISTORY: Social History   Socioeconomic History   Marital status: Married    Spouse name: Not on file   Number of children: 2   Years of education: college   Highest education level: Bachelor's degree (e.g., BA, AB, BS)  Occupational History   Occupation: Disabled  Tobacco Use   Smoking status: Never   Smokeless tobacco: Never  Vaping Use   Vaping Use: Never used  Substance and Sexual Activity   Alcohol use: Not Currently    Comment: social   Drug use: Never   Sexual activity: Not Currently  Other Topics Concern   Not on file  Social History Narrative   Lives at home with his wife and son.   Right-handed.   Moderate caffeine use.   Social Determinants of Health   Financial Resource Strain: Not on file  Food Insecurity: Not on file  Transportation Needs: Not on file  Physical Activity: Not on file  Stress: Not on file  Social Connections: Not on file  Intimate  Partner Violence: Not on file      Marcial Pacas, M.D. Ph.D.  [facility] [address], [place], [place] [address] Ph: [phone] Fax: [phone]  CC:  Maximiano Coss, NP [address] [place],  [place] [address]  Maximiano Coss, NP

## 2021-02-13 ENCOUNTER — Ambulatory Visit: Payer: Medicare PPO | Admitting: Neurology

## 2021-03-07 NOTE — Progress Notes (Incomplete)
° °  Patient Care Team: Maximiano Coss, NP as PCP - General (Adult Health Nurse Practitioner)  DIAGNOSIS: No diagnosis found.  CHIEF COMPLIANT: Follow-up of leukopenia  INTERVAL HISTORY: Victor Christensen is a 51 y.o. with above-mentioned history of neuromyelitis and recent leukopenia. He presents to the clinic today to review his labs.   ALLERGIES:  is allergic to duloxetine and oxycodone-acetaminophen.  MEDICATIONS:  Current Outpatient Medications  Medication Sig Dispense Refill   Cholecalciferol (VITAMIN D3 PO) Take 2,000 Units by mouth daily.     diazepam (VALIUM) 5 MG tablet Take 1-2 tablets 30 minutes prior to MRI, may repeat once as needed. Must have driver. 3 tablet 0   gabapentin (NEURONTIN) 300 MG capsule Take 4 capsules (1,200 mg total) by mouth 3 (three) times daily. Please call [phone] to schedule appt for continued refills. 360 capsule 11   metFORMIN (GLUCOPHAGE) 500 MG tablet Take 1 tablet (500 mg total) by mouth 3 (three) times daily with meals. 270 tablet 1   vitamin B-12 (CYANOCOBALAMIN) 1000 MCG tablet Take 1,000 mcg by mouth daily.     No current facility-administered medications for this visit.    PHYSICAL EXAMINATION: ECOG PERFORMANCE STATUS: {CHL ONC ECOG [phone]}  There were no vitals filed for this visit. There were no vitals filed for this visit.  LABORATORY DATA:  I have reviewed the data as listed CMP Latest Ref Rng & Units 08/01/2020 10/06/2019 09/16/2018  Glucose 65 - 99 mg/dL 144(H) 87 122(H)  BUN 6 - 24 mg/dL 13 10 13   Creatinine 0.76 - 1.27 mg/dL 1.20 1.09 1.21  Sodium 134 - 144 mmol/L 140 138 138  Potassium 3.5 - 5.2 mmol/L 4.7 4.6 4.7  Chloride 96 - 106 mmol/L 101 100 100  CO2 20 - 29 mmol/L 26 24 23   Calcium 8.7 - 10.2 mg/dL 9.7 9.7 10.0  Total Protein 6.0 - 8.5 g/dL 7.1 7.6 7.4  Total Bilirubin 0.0 - 1.2 mg/dL 0.3 0.4 0.4  Alkaline Phos 44 - 121 IU/L 47 52 48  AST 0 - 40 IU/L 19 22 23   ALT 0 - 44 IU/L 21 29 26     Lab Results  Component  Value Date   WBC 2.2 (L) 11/06/2020   HGB 12.6 (L) 11/06/2020   HCT 40.6 11/06/2020   MCV 80.9 11/06/2020   PLT 185 11/06/2020   NEUTROABS 1.1 (L) 11/06/2020    ASSESSMENT & PLAN:  No problem-specific Assessment & Plan notes found for this encounter.    No orders of the defined types were placed in this encounter.  The patient has a good understanding of the overall plan. he agrees with it. he will call with any problems that may develop before the next visit here.  Total time spent: *** mins including face to face time and time spent for planning, charting and coordination of care  Rulon Eisenmenger, MD, MPH 03/07/2021  I, Thana Ates, am acting as scribe for Dr. Nicholas Lose.  {insert scribe attestation}

## 2021-03-08 ENCOUNTER — Inpatient Hospital Stay: Payer: Medicare PPO

## 2021-03-08 ENCOUNTER — Inpatient Hospital Stay: Payer: Medicare PPO | Attending: Hematology and Oncology | Admitting: Hematology and Oncology

## 2021-03-08 NOTE — Assessment & Plan Note (Deleted)
Neutropenia started 02/05/2018. He was previously in [place] and moved to [place] for his wife. Lab review: 05/17/2019: WBC 3, hemoglobin 13.4, ANC 1.5 (it was 1.2 on 04/19/2019) B12: 181 ANA: Negative Folic acid: 2.2,GUR: 427 09/06/2019: WBC 4.5, hemoglobin 12.6, platelets 277 03/09/19:WBC: 3.9, Hb 12.5, ANC 2.2 08/01/2020: WBC 2.1, hemoglobin 12.6, platelets 219, ANC 1 11/06/2020: WBC 2.2, hemoglobin 12.6, ANC 1.1, platelets 185  Current treatment: B12 supplementation1000 mcg sublingually daily I discussed with him that the cause of the neutropenia could be autoimmune versus cyclical versus ethnicity related He has not had any frequent infections.  Follows with Neurology for neurological disorder that caused his lower extremity weakness  1 year follow-up with recheck of his labs

## 2021-03-15 DIAGNOSIS — R8279 Other abnormal findings on microbiological examination of urine: Secondary | ICD-10-CM | POA: Diagnosis not present

## 2021-03-15 DIAGNOSIS — N3000 Acute cystitis without hematuria: Secondary | ICD-10-CM | POA: Diagnosis not present

## 2021-04-05 ENCOUNTER — Ambulatory Visit: Payer: Medicare PPO | Admitting: Internal Medicine

## 2021-04-05 ENCOUNTER — Encounter: Payer: Self-pay | Admitting: Internal Medicine

## 2021-04-05 ENCOUNTER — Other Ambulatory Visit: Payer: Self-pay

## 2021-04-05 VITALS — BP 116/64 | HR 92 | Temp 98.3°F | Resp 16 | Ht 71.0 in | Wt 213.0 lb

## 2021-04-05 DIAGNOSIS — Z1159 Encounter for screening for other viral diseases: Secondary | ICD-10-CM | POA: Diagnosis not present

## 2021-04-05 DIAGNOSIS — E119 Type 2 diabetes mellitus without complications: Secondary | ICD-10-CM | POA: Diagnosis not present

## 2021-04-05 DIAGNOSIS — Z125 Encounter for screening for malignant neoplasm of prostate: Secondary | ICD-10-CM

## 2021-04-05 DIAGNOSIS — Z23 Encounter for immunization: Secondary | ICD-10-CM | POA: Diagnosis not present

## 2021-04-05 DIAGNOSIS — Z1211 Encounter for screening for malignant neoplasm of colon: Secondary | ICD-10-CM

## 2021-04-05 DIAGNOSIS — E785 Hyperlipidemia, unspecified: Secondary | ICD-10-CM | POA: Diagnosis not present

## 2021-04-05 DIAGNOSIS — Z0001 Encounter for general adult medical examination with abnormal findings: Secondary | ICD-10-CM

## 2021-04-05 DIAGNOSIS — E538 Deficiency of other specified B group vitamins: Secondary | ICD-10-CM

## 2021-04-05 LAB — URINALYSIS, ROUTINE W REFLEX MICROSCOPIC
Bilirubin Urine: NEGATIVE
Ketones, ur: NEGATIVE
Nitrite: NEGATIVE
Specific Gravity, Urine: 1.02 (ref 1.000–1.030)
Total Protein, Urine: NEGATIVE
Urine Glucose: NEGATIVE
Urobilinogen, UA: 0.2 (ref 0.0–1.0)
pH: 6 (ref 5.0–8.0)

## 2021-04-05 LAB — BASIC METABOLIC PANEL
BUN: 9 mg/dL (ref 6–23)
CO2: 27 mEq/L (ref 19–32)
Calcium: 9.9 mg/dL (ref 8.4–10.5)
Chloride: 102 mEq/L (ref 96–112)
Creatinine, Ser: 1.37 mg/dL (ref 0.40–1.50)
GFR: 60.51 mL/min (ref >60.00)
Glucose, Bld: 111 mg/dL — ABNORMAL HIGH (ref 70–99)
Potassium: 4.8 mEq/L (ref 3.5–5.1)
Sodium: 136 mEq/L (ref 135–145)

## 2021-04-05 LAB — CBC WITH DIFFERENTIAL/PLATELET
Basophils Absolute: 0 10*3/uL (ref 0.0–0.1)
Basophils Relative: 1 % (ref 0.0–3.0)
Eosinophils Absolute: 0.1 10*3/uL (ref 0.0–0.7)
Eosinophils Relative: 4.3 % (ref 0.0–5.0)
HCT: 41.4 % (ref 39.0–52.0)
Hemoglobin: 13.1 g/dL (ref 13.0–17.0)
Lymphocytes Relative: 26 % (ref 12.0–46.0)
Lymphs Abs: 0.9 10*3/uL (ref 0.7–4.0)
MCHC: 31.7 g/dL (ref 30.0–36.0)
MCV: 79.3 fl (ref 78.0–100.0)
Monocytes Absolute: 0.6 10*3/uL (ref 0.1–1.0)
Monocytes Relative: 17 % — ABNORMAL HIGH (ref 3.0–12.0)
Neutro Abs: 1.8 10*3/uL (ref 1.4–7.7)
Neutrophils Relative %: 51.7 % (ref 43.0–77.0)
Platelets: 228 10*3/uL (ref 150.0–400.0)
RBC: 5.23 Mil/uL (ref 4.22–5.81)
RDW: 16 % — ABNORMAL HIGH (ref 11.5–15.5)
WBC: 3.4 10*3/uL — ABNORMAL LOW (ref 4.0–10.5)

## 2021-04-05 LAB — HEPATIC FUNCTION PANEL
ALT: 34 U/L (ref 0–53)
AST: 22 U/L (ref 0–37)
Albumin: 4.5 g/dL (ref 3.5–5.2)
Alkaline Phosphatase: 41 U/L (ref 39–117)
Bilirubin, Direct: 0.2 mg/dL (ref 0.0–0.3)
Total Bilirubin: 0.6 mg/dL (ref 0.2–1.2)
Total Protein: 7.5 g/dL (ref 6.0–8.3)

## 2021-04-05 LAB — LIPID PANEL
Cholesterol: 116 mg/dL (ref 0–200)
HDL: 37.2 mg/dL — ABNORMAL LOW (ref >39.00)
LDL Cholesterol: 56 mg/dL (ref 0–99)
NonHDL: 79.09
Total CHOL/HDL Ratio: 3
Triglycerides: 113 mg/dL (ref 0.0–149.0)
VLDL: 22.6 mg/dL (ref 0.0–40.0)

## 2021-04-05 LAB — MICROALBUMIN / CREATININE URINE RATIO
Creatinine,U: 75.9 mg/dL
Microalb Creat Ratio: 7.3 mg/g (ref 0.0–30.0)
Microalb, Ur: 5.6 mg/dL — ABNORMAL HIGH (ref 0.0–1.9)

## 2021-04-05 LAB — VITAMIN B12: Vitamin B-12: 453 pg/mL (ref 211–911)

## 2021-04-05 LAB — PSA: PSA: 3.13 ng/mL (ref 0.10–4.00)

## 2021-04-05 LAB — TSH: TSH: 1.29 u[IU]/mL (ref 0.35–5.50)

## 2021-04-05 LAB — HEMOGLOBIN A1C: Hgb A1c MFr Bld: 7.5 % — ABNORMAL HIGH (ref 4.6–6.5)

## 2021-04-05 LAB — FOLATE: Folate: 4 ng/mL — ABNORMAL LOW (ref >5.9)

## 2021-04-05 MED ORDER — FOLIC ACID 1 MG PO TABS: 1.0000 mg | ORAL_TABLET | Freq: Every day | ORAL | 1 refills | Status: DC

## 2021-04-05 MED ORDER — METFORMIN HCL 500 MG PO TABS: 500.0000 mg | ORAL_TABLET | Freq: Three times a day (TID) | ORAL | 1 refills | Status: DC

## 2021-04-05 NOTE — Progress Notes (Signed)
Subjective:  Patient ID: Victor Christensen, male    DOB: 03-Oct-1970  Age: 51 y.o. MRN: [id number]  CC: Annual Exam, Hyperlipidemia, and Diabetes  This visit occurred during the SARS-CoV-2 public health emergency.  Safety protocols were in place, including screening questions prior to the visit, additional usage of staff PPE, and extensive cleaning of exam room while observing appropriate contact time as indicated for disinfecting solutions.    HPI Victor Christensen presents for a CPX and to establish.  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  He also has diabetes mellitus type 2.  He recently ran out of metformin.  He denies polys.  He denies chest pain, shortness of breath, diaphoresis, or edema.  Outpatient Medications Prior to Visit  Medication Sig Dispense Refill   Cholecalciferol (VITAMIN D3 PO) Take 2,000 Units by mouth daily.     diazepam (VALIUM) 5 MG tablet Take 1-2 tablets 30 minutes prior to MRI, may repeat once as needed. Must have driver. 3 tablet 0   gabapentin (NEURONTIN) 300 MG capsule Take 4 capsules (1,200 mg total) by mouth 3 (three) times daily. Please call [phone] to schedule appt for continued refills. 360 capsule 11   vitamin B-12 (CYANOCOBALAMIN) 1000 MCG tablet Take 1,000 mcg by mouth daily.     metFORMIN (GLUCOPHAGE) 500 MG tablet Take 1 tablet (500 mg total) by mouth 3 (three) times daily with meals. 270 tablet 1   No facility-administered medications prior to visit.    ROS Review of Systems  Constitutional: Negative.  Negative for diaphoresis and fatigue.  HENT: Negative.    Eyes: Negative.   Respiratory: Negative.  Negative for cough, chest tightness, shortness of breath and wheezing.   Cardiovascular:  Negative for chest pain, palpitations and leg swelling.  Gastrointestinal:  Negative for abdominal pain, blood in stool, constipation, diarrhea, nausea and vomiting.  Genitourinary: Negative.  Negative for  difficulty urinating, scrotal swelling and testicular pain.  Musculoskeletal: Negative.  Negative for myalgias.  Skin: Negative.   Neurological:  Positive for weakness and numbness. Negative for dizziness and speech difficulty.  Hematological:  Negative for adenopathy. Does not bruise/bleed easily.  Psychiatric/Behavioral: Negative.  Negative for dysphoric mood and sleep disturbance.    Objective:  BP 116/64 (BP Location: Left Arm, Patient Position: Sitting, Cuff Size: Large)    Pulse 92    Temp 98.3 F (36.8 C) (Oral)    Resp 16    Ht 5' 11"  (1.803 m)    Wt 213 lb (96.6 kg)    SpO2 97%    BMI 29.71 kg/m   BP Readings from Last 3 Encounters:  04/05/21 116/64  02/01/21 118/78  11/06/20 99/68    Wt Readings from Last 3 Encounters:  04/05/21 213 lb (96.6 kg)  11/06/20 212 lb 12.8 oz (96.5 kg)  08/07/20 212 lb (96.2 kg)    Physical Exam Vitals reviewed.  Constitutional:      General: He is not in acute distress.    Appearance: He is ill-appearing (in a wheelchair). He is not toxic-appearing or diaphoretic.  HENT:     Nose: Nose normal.     Mouth/Throat:     Mouth: Mucous membranes are moist.  Eyes:     General: No scleral icterus.    Conjunctiva/sclera: Conjunctivae normal.  Cardiovascular:     Rate and Rhythm: Normal rate and regular rhythm.     Heart sounds: No murmur heard. Pulmonary:     Effort: Pulmonary effort  is normal.     Breath sounds: No stridor. No wheezing, rhonchi or rales.  Abdominal:     General: Abdomen is flat.     Palpations: There is no mass.     Tenderness: There is no abdominal tenderness. There is no guarding.     Hernia: No hernia is present.  Musculoskeletal:        General: Normal range of motion.     Cervical back: Neck supple.     Right lower leg: No edema.     Left lower leg: No edema.  Lymphadenopathy:     Cervical: No cervical adenopathy.  Skin:    General: Skin is warm and dry.     Findings: No rash.  Neurological:     Mental  Status: He is alert and oriented to person, place, and time.     Sensory: Sensory deficit present.     Motor: Weakness present.     Coordination: Coordination abnormal.     Gait: Gait abnormal.     Deep Tendon Reflexes: Reflexes abnormal.    Lab Results  Component Value Date   WBC 3.4 (L) 04/05/2021   HGB 13.1 04/05/2021   HCT 41.4 04/05/2021   PLT 228.0 04/05/2021   GLUCOSE 111 (H) 04/05/2021   CHOL 116 04/05/2021   TRIG 113.0 04/05/2021   HDL 37.20 (L) 04/05/2021   LDLCALC 56 04/05/2021   ALT 34 04/05/2021   AST 22 04/05/2021   NA 136 04/05/2021   K 4.8 04/05/2021   CL 102 04/05/2021   CREATININE 1.37 04/05/2021   BUN 9 04/05/2021   CO2 27 04/05/2021   TSH 1.29 04/05/2021   PSA 3.13 04/05/2021   HGBA1C 7.5 (H) 04/05/2021   MICROALBUR 5.6 (H) 04/05/2021    MR CERVICAL SPINE W WO CONTRAST  Result Date: 08/20/2020  [facility] [address] [place], [place] [address] [phone] NEUROIMAGING REPORT STUDY DATE: 08/20/2020 PATIENT NAME: Victor Christensen DOB: 1970-06-13 MRN: [id number] EXAM: MRI of the cervical spine with and without contrast ORDERING CLINICIAN: Marcial Pacas MD, PhD CLINICAL HISTORY: 51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 In addition 4 mm axial slices from Z3-0 down to T1-2 level were included with T2 and gradient echo views.  After the infusion contrast, additional T1-weighted images were performed. CONTRAST: 19 mL [facility] IMAGING SITE: [facility], [place], [place], [place] FINDINGS: :  On sagittal images, the spine is imaged from above the cervicomedullary junction to T2.   Spinal cord atrophy is noted, similar to the 2020 MRI.  There is patchy T2 hyperintense signal within the cervical spinal cord, similar to the 2020 MRI.  The spinal cord  has a normal enhancement pattern.  The vertebral bodies are normally aligned.   The vertebral bodies have normal signal.  The discs and interspaces were further evaluated on axial views from C2 to T1 as follows: C2-C3: There is minimal right uncovertebral spurring causing mild right foraminal narrowing but no nerve root compression or spinal stenosis. C3-C4: There is a small central disc protrusion.  Neural foramina are widely patent and there is no nerve root compression or spinal stenosis. C4-C5: There are right greater than left disc osteophyte complexes causing moderately severe right and moderate left foraminal narrowing.  There is mild spinal stenosis.  There is potential for  right C5 nerve root compression. C5-C6: There is disc bulging and minimal uncovertebral spurring causing mild right and minimal left foraminal narrowing and mild spinal stenosis but no nerve root compression. C6-C7: There is a prominent left disc osteophyte complex causing moderate foraminal narrowing and mild spinal stenosis with some potential for right C7 nerve root compression. C7-T1: The disc and interspace appear normal. T1-T2: The disc and interspace appear normal. Degenerative changes are essentially unchanged compared to the 2020 MRI. After the infusion of contrast, a normal enhancement pattern was observed.   This MRI of the cervical spine with and without contrast shows the following: 1.   There is mild spinal cord atrophy associated with patchy T2 hyperintense signal.  The overall appearance is similar to the 2020 MRI.  No enhancing lesions in the spinal cord. 2.   At C4-C5, there are right greater than left disc osteophyte complexes causing moderately severe right foraminal narrowing and mild spinal stenosis.  There is potential for right C5 nerve root compression. 3.   At C5-C6, there are degenerative changes causing mild foraminal narrowing and mild spinal stenosis but no nerve root compression. 4.   At C6-C7, there is a  large disc osteophyte complex to the left causing moderate left foraminal narrowing and mild spinal stenosis.  There is some potential for right C7 nerve root compression. 5.   Degenerative changes are essentially unchanged compared to the 2020 MRI. 6.   Normal enhancement pattern. INTERPRETING PHYSICIAN: Richard A. Felecia Shelling, MD, PhD, FAAN Certified in  Neuroimaging by [employer]    Assessment & Plan:   Victor Christensen was seen today for annual exam, hyperlipidemia and diabetes.  Diagnoses and all orders for this visit:  Type 2 diabetes mellitus without complication, without long-term current use of insulin ([place])- His A1c is up to 7.5%.  Will restart metformin. -     Basic metabolic panel; Future -     Microalbumin / creatinine urine ratio; Future -     Urinalysis, Routine w reflex microscopic; Future -     Hemoglobin A1c; Future -     Hepatic function panel; Future -     HM Diabetes Foot Exam -     Ambulatory referral to Ophthalmology -     Hepatic function panel -     Hemoglobin A1c -     Urinalysis, Routine w reflex microscopic -     Microalbumin / creatinine urine ratio -     Basic metabolic panel -     metFORMIN (GLUCOPHAGE) 500 MG tablet; Take 1 tablet (500 mg total) by mouth 3 (three) times daily with meals.  Vitamin B12 deficiency- His H&H and B12 are normal but his folate level is low. -     Vitamin B12; Future -     CBC with Differential/Platelet; Future -     Folate; Future -     Folate -     CBC with Differential/Platelet -     Vitamin B12  Encounter for general adult medical examination with abnormal findings- Exam completed, labs reviewed, vaccines reviewed and updated, cancer screenings addressed, patient education was given.  Dyslipidemia, goal LDL below 100- I recommended that he take a statin for cardiovascular risk reduction. -     Lipid panel; Future -     TSH; Future -     Hepatic function panel; Future -     Hepatic function panel -     TSH -      Lipid panel -  atorvastatin (LIPITOR) 10 MG tablet; Take 1 tablet (10 mg total) by mouth daily.  Screening for prostate cancer -     PSA; Future -     PSA  Need for hepatitis C screening test -     Hepatitis C antibody; Future -     Hepatitis C antibody  Need for vaccination -     Pneumococcal conjugate vaccine 20-valent  Screen for colon cancer -     Cologuard  Folate deficiency -     folic acid (FOLVITE) 1 MG tablet; Take 1 tablet (1 mg total) by mouth daily.  Need for prophylactic vaccination with combined diphtheria-tetanus-pertussis (DTP) vaccine -     Tdap (BOOSTRIX) 5-2.5-18.5 LF-MCG/0.5 injection; Inject 0.5 mLs into the muscle once for 1 dose.   I am having Victor Christensen start on folic acid, Boostrix, and atorvastatin. I am also having him maintain his Cholecalciferol (VITAMIN D3 PO), gabapentin, diazepam, vitamin B-12, and metFORMIN.  Meds ordered this encounter  Medications   folic acid (FOLVITE) 1 MG tablet    Sig: Take 1 tablet (1 mg total) by mouth daily.    Dispense:  90 tablet    Refill:  1   metFORMIN (GLUCOPHAGE) 500 MG tablet    Sig: Take 1 tablet (500 mg total) by mouth 3 (three) times daily with meals.    Dispense:  270 tablet    Refill:  1   Tdap (BOOSTRIX) 5-2.5-18.5 LF-MCG/0.5 injection    Sig: Inject 0.5 mLs into the muscle once for 1 dose.    Dispense:  0.5 mL    Refill:  0   atorvastatin (LIPITOR) 10 MG tablet    Sig: Take 1 tablet (10 mg total) by mouth daily.    Dispense:  90 tablet    Refill:  1     Follow-up: Return in about 6 months (around 10/06/2021).  [name], MD

## 2021-04-05 NOTE — Patient Instructions (Signed)
Health Maintenance, Male ?Adopting a healthy lifestyle and getting preventive care are important in promoting health and wellness. Ask your health care provider about: ?The right schedule for you to have regular tests and exams. ?Things you can do on your own to prevent diseases and keep yourself healthy. ?What should I know about diet, weight, and exercise? ?Eat a healthy diet ? ?Eat a diet that includes plenty of vegetables, fruits, low-fat dairy products, and lean protein. ?Do not eat a lot of foods that are high in solid fats, added sugars, or sodium. ?Maintain a healthy weight ?Body mass index (BMI) is a measurement that can be used to identify possible weight problems. It estimates body fat based on height and weight. Your health care provider can help determine your BMI and help you achieve or maintain a healthy weight. ?Get regular exercise ?Get regular exercise. This is one of the most important things you can do for your health. Most adults should: ?Exercise for at least 150 minutes each week. The exercise should increase your heart rate and make you sweat (moderate-intensity exercise). ?Do strengthening exercises at least twice a week. This is in addition to the moderate-intensity exercise. ?Spend less time sitting. Even light physical activity can be beneficial. ?Watch cholesterol and blood lipids ?Have your blood tested for lipids and cholesterol at 50 years of age, then have this test every 5 years. ?You may need to have your cholesterol levels checked more often if: ?Your lipid or cholesterol levels are high. ?You are older than 50 years of age. ?You are at high risk for heart disease. ?What should I know about cancer screening? ?Many types of cancers can be detected early and may often be prevented. Depending on your health history and family history, you may need to have cancer screening at various ages. This may include screening for: ?Colorectal cancer. ?Prostate cancer. ?Skin cancer. ?Lung  cancer. ?What should I know about heart disease, diabetes, and high blood pressure? ?Blood pressure and heart disease ?High blood pressure causes heart disease and increases the risk of stroke. This is more likely to develop in people who have high blood pressure readings or are overweight. ?Talk with your health care provider about your target blood pressure readings. ?Have your blood pressure checked: ?Every 3-5 years if you are 50-50 years of age. ?Every year if you are 50 years old or older. ?If you are between the ages of 11 and 57 and are a current or former smoker, ask your health care provider if you should have a one-time screening for abdominal aortic aneurysm (AAA). ?Diabetes ?Have regular diabetes screenings. This checks your fasting blood sugar level. Have the screening done: ?Once every three years after age 8 if you are 50 years of age and have a low risk for diabetes. ?More often and at a younger age if you are overweight or have a high risk for diabetes. ?What should I know about preventing infection? ?Hepatitis B ?If you have a higher risk for hepatitis B, you should be screened for this virus. Talk with your health care provider to find out if you are at risk for hepatitis B infection. ?Hepatitis C ?Blood testing is recommended for: ?Everyone born from [birth year] through 1964. ?Anyone with known risk factors for hepatitis C. ?Sexually transmitted infections (STIs) ?You should be screened each year for STIs, including gonorrhea and chlamydia, if: ?You are sexually active and are younger than 51 years of age. ?You are older than 50 years of age and your  health care provider tells you that you are at risk for this type of infection. ?Your sexual activity has changed since you were last screened, and you are at increased risk for chlamydia or gonorrhea. Ask your health care provider if you are at risk. ?Ask your health care provider about whether you are at high risk for HIV. Your health care provider  may recommend a prescription medicine to help prevent HIV infection. If you choose to take medicine to prevent HIV, you should first get tested for HIV. You should then be tested every 3 months for as long as you are taking the medicine. ?Follow these instructions at home: ?Alcohol use ?Do not drink alcohol if your health care provider tells you not to drink. ?If you drink alcohol: ?Limit how much you have to 0-2 drinks a day. ?Know how much alcohol is in your drink. In the [place] one drink equals one 12 oz bottle of beer (355 mL), one 5 oz glass of wine (148 mL), or one 1? oz glass of hard liquor (44 mL). ?Lifestyle ?Do not use any products that contain nicotine or tobacco. These products include cigarettes, chewing tobacco, and vaping devices, such as e-cigarettes. If you need help quitting, ask your health care provider. ?Do not use street drugs. ?Do not share needles. ?Ask your health care provider for help if you need support or information about quitting drugs. ?General instructions ?Schedule regular health, dental, and eye exams. ?Stay current with your vaccines. ?Tell your health care provider if: ?You often feel depressed. ?You have ever been abused or do not feel safe at home. ?Summary ?Adopting a healthy lifestyle and getting preventive care are important in promoting health and wellness. ?Follow your health care provider's instructions about healthy diet, exercising, and getting tested or screened for diseases. ?Follow your health care provider's instructions on monitoring your cholesterol and blood pressure. ?This information is not intended to replace advice given to you by your health care provider. Make sure you discuss any questions you have with your health care provider. ?Document Revised: 06/05/2020 Document Reviewed: 06/05/2020 ?Elsevier Patient Education ? [place]. ? ?

## 2021-04-06 LAB — HEPATITIS C ANTIBODY
Hepatitis C Ab: NONREACTIVE
SIGNAL TO CUT-OFF: <0.02 (ref <1.00)

## 2021-04-06 MED ORDER — ATORVASTATIN CALCIUM 10 MG PO TABS: 10.0000 mg | ORAL_TABLET | Freq: Every day | ORAL | 1 refills | Status: DC

## 2021-04-06 MED ORDER — BOOSTRIX 5-2.5-18.5 LF-MCG/0.5 IM SUSP: 0.5000 mL | Freq: Once | INTRAMUSCULAR | 0 refills | Status: AC

## 2021-04-29 LAB — COLOGUARD: COLOGUARD: NEGATIVE

## 2021-05-01 ENCOUNTER — Telehealth: Payer: Medicare PPO | Admitting: Nurse Practitioner

## 2021-05-01 DIAGNOSIS — R300 Dysuria: Secondary | ICD-10-CM

## 2021-05-01 NOTE — Progress Notes (Signed)
E-Visit for Urinary Problems ? ?Based on what you shared with me, I feel your condition warrants further evaluation and I recommend that you be seen for a face to face office visit.  Male bladder infections are not very common.  We worry about prostate or kidney conditions.  The standard of care is to examine the abdomen and kidneys, and to do a urine and blood test to make sure that something more serious is not going on.  We recommend that you see a provider today.  If your doctor's office is closed [facility] has the following Urgent Cares: ? ?  ?NOTE: You will not be charged for this e-visit. ? ?If you are having a true medical emergency please call 911.   ? ?  ? For an urgent face to face visit, [facility] has six urgent care centers for your convenience:  ?  ? [facility] [place] [facility] ?Get Driving Directions ?[phone] ?[address] ?[place], [place] [address] ?  ? [facility]) ?Get Driving Directions ?[phone] ?[address] ?[place], [place] [address] ? ?[facility] [place] ([place]) ?Get Driving Directions ?[address],  [place]  [address] ? ?[facility] ?Get Driving Directions ?[phone] ?[address], [address] ?[place], [place] [address] ?  ?[facility] ?Get Driving Directions  ?[phone] ?[address] ?[address] ?[place], [place] [address] ?  ?[facility] ?Get Driving Directions ?[phone] ?[address] ?[place], [place] [address] ? ?Your MyChart E-visit questionnaire answers were reviewed by a board certified advanced clinical practitioner to complete your personal care plan based on your specific symptoms.  Thank you for using e-Visits. ?

## 2021-05-03 ENCOUNTER — Ambulatory Visit
Admission: RE | Admit: 2021-05-03 | Discharge: 2021-05-03 | Disposition: A | Discharge status: Home / Self Care | Payer: Medicare PPO | Source: Ambulatory Visit | Attending: Emergency Medicine | Admitting: Emergency Medicine

## 2021-05-03 VITALS — BP 114/78 | HR 103 | Temp 98.6°F | Resp 18

## 2021-05-03 DIAGNOSIS — E119 Type 2 diabetes mellitus without complications: Secondary | ICD-10-CM | POA: Diagnosis not present

## 2021-05-03 DIAGNOSIS — R739 Hyperglycemia, unspecified: Secondary | ICD-10-CM | POA: Diagnosis not present

## 2021-05-03 DIAGNOSIS — R81 Glycosuria: Secondary | ICD-10-CM | POA: Insufficient documentation

## 2021-05-03 DIAGNOSIS — R8281 Pyuria: Secondary | ICD-10-CM | POA: Insufficient documentation

## 2021-05-03 DIAGNOSIS — R7309 Other abnormal glucose: Secondary | ICD-10-CM | POA: Diagnosis not present

## 2021-05-03 DIAGNOSIS — R350 Frequency of micturition: Secondary | ICD-10-CM | POA: Insufficient documentation

## 2021-05-03 DIAGNOSIS — E1165 Type 2 diabetes mellitus with hyperglycemia: Secondary | ICD-10-CM

## 2021-05-03 DIAGNOSIS — R809 Proteinuria, unspecified: Secondary | ICD-10-CM | POA: Diagnosis not present

## 2021-05-03 LAB — POCT URINALYSIS DIP (MANUAL ENTRY)
Bilirubin, UA: NEGATIVE
Glucose, UA: 500 mg/dL — AB
Ketones, POC UA: NEGATIVE mg/dL
Nitrite, UA: NEGATIVE
Protein Ur, POC: 30 mg/dL — AB
Spec Grav, UA: 1.02 (ref 1.010–1.025)
Urobilinogen, UA: 0.2 E.U./dL
pH, UA: 6 (ref 5.0–8.0)

## 2021-05-03 LAB — POCT FASTING CBG KUC MANUAL ENTRY: POCT Glucose (KUC): 240 mg/dL — AB (ref 70–99)

## 2021-05-03 MED ORDER — SULFAMETHOXAZOLE-TRIMETHOPRIM 800-160 MG PO TABS: 1.0000 | ORAL_TABLET | Freq: Two times a day (BID) | ORAL | 0 refills | Status: AC

## 2021-05-03 NOTE — Discharge Instructions (Addendum)
Your urinalysis today is abnormal.  There is a significant amount of sugar and protein in your urine which is indicative of poorly controlled type 2 diabetes.  You also have a significant amount of white blood cells in your urine which is a sign that you likely have a bacterial infection in your urinary tract.   ? ?Because you have increased frequency of urination as well, I recommend that you begin antibiotics at this time.  A prescription for Bactrim has been sent to your pharmacy, please take 1 tablet twice daily for the next 7 days.   ? ?Urine culture will be performed to help determine the bacterial organism causing your symptoms.  Urine cultures typically take 3 to 5 days and the results will be posted to your MyChart account once it is complete.  If there is a positive finding, you will be contacted by phone with further recommendations, if any. ? ?Please be sure that you are taking your metformin as prescribed to better control your blood sugars, reduce your risk of infection and help you decrease your risk of the inevitable life altering side effects of poorly controlled type 2 diabetes including kidney failure, blindness, nerve dysfunction, impotence, stroke, heart attack and early death. ? ?Thank you for visiting urgent care today.  We appreciate the opportunity to participate in your care. ?

## 2021-05-03 NOTE — ED Provider Notes (Signed)
?[facility] ? ? ? ?CSN: [id number] ?Arrival date & time: 05/03/21  1030 ?  ? ?HISTORY  ? ?Chief Complaint  ?Patient presents with  ? Urinary Frequency  ?  UTI - Entered by patient  ? ?HPI ?Victor Christensen is a 51 y.o. male. Patient complains of increased frequency of urination.  Patient states he is noticed that his urine has been cloudy since last Friday.  Patient is a type II [place]ik that is poorly controlled, last A1c was 7.5 in March 2023.  On arrival today, patient has 500 mg/dL of sugar in his urine, proteinuria and a significant number of leukocytes present.  Fingerstick blood glucose on arrival today is 240, patient states he is not fasting.  Heart rate is elevated at 103.  Patient was restarted on metformin 500 mg 3 times daily by his internal medicine provider in March 2023. ? ? ?Past Medical History:  ?Diagnosis Date  ? Anxiety   ? Diabetes mellitus without complication ([place])   ? type 2   ? 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 ?Patient Active Problem List  ? Diagnosis Date Noted  ? Encounter for general adult medical examination with abnormal findings 04/05/2021  ? Dyslipidemia, goal LDL below 100 04/05/2021  ? Need for hepatitis C screening test 04/05/2021  ? Screening for prostate cancer 04/05/2021  ? Wheelchair confinement status 02/01/2021  ? Vitamin B12 deficiency 07/19/2019  ? Type 2 diabetes mellitus without complication, without long-term current use of insulin ([place]) 10/09/2018  ? Neuromyelitis ([place]) 09/16/2018  ? ED (erectile dysfunction) 06/01/2013  ? 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 Lumbar spondylosis with myelopathy 05/20/2013  ? Neurogenic bladder 03/28/2011  ? ?Past Surgical History:  ?Procedure Laterality Date  ? CYSTOSCOPY WITH STENT PLACEMENT Left 01/15/2018  ? Procedure: CYSTOSCOPY WITH STENT PLACEMENT retrograde pylegram;  Surgeon: Franchot Gallo, MD;  Location: [facility];  Service: Urology;  Laterality: Left;  ? CYSTOSCOPY/URETEROSCOPY/HOLMIUM LASER/STENT PLACEMENT Left 02/05/2018  ?  Procedure: CYSTOSCOPY/URETEROSCOPY/HOLMIUM LASER/STENT EXTRACTION WITH LEFT STENT PLACEMENT;  Surgeon: Franchot Gallo, MD;  Location: [facility];  Service: Urology;  Laterality: Left;  ? ? ?Home Medications   ? ?Prior to Admission medications   ?Medication Sig Start Date End Date Taking? Authorizing Provider  ?atorvastatin (LIPITOR) 10 MG tablet Take 1 tablet (10 mg total) by mouth daily. 04/06/21   Janith Lima, MD  ?Cholecalciferol (VITAMIN D3 PO) Take 2,000 Units by mouth daily.    [provider]  ?diazepam (VALIUM) 5 MG tablet Take 1-2 tablets 30 minutes prior to MRI, may repeat once as needed. Must have driver. 08/01/20   Marcial Pacas, MD  ?folic acid (FOLVITE) 1 MG tablet Take 1 tablet (1 mg total) by mouth daily. 04/05/21   Janith Lima, MD  ?gabapentin (NEURONTIN) 300 MG capsule Take 4 capsules (1,200 mg total) by mouth 3 (three) times daily. Please call [phone] to schedule appt for continued refills. 08/01/20   Marcial Pacas, MD  ?metFORMIN (GLUCOPHAGE) 500 MG tablet Take 1 tablet (500 mg total) by mouth 3 (three) times daily with meals. 04/05/21   Janith Lima, MD  ?vitamin B-12 (CYANOCOBALAMIN) 1000 MCG tablet Take 1,000 mcg by mouth daily.    [provider]  ? ? ?Family History ?Family History  ?Problem Relation Age of Onset  ? Hypertension Mother   ? Hypertension Father   ? Diabetes Father   ? ?Social History ?Social History  ? ?Tobacco Use  ? Smoking status: Never  ? Smokeless tobacco: Never  ?  Vaping Use  ? Vaping Use: Never used  ?Substance Use Topics  ? Alcohol use: Not Currently  ?  Comment: social  ? Drug use: Never  ? ?Allergies   ?Duloxetine and Oxycodone-acetaminophen ? ?Review of Systems ?Review of Systems ?Pertinent findings noted in history of present illness.  ? ?Physical Exam ?Triage Vital Signs ?ED Triage Vitals  ?Enc Vitals Group  ?   BP 11/24/20 0827 (!) 147/82  ?   Pulse Rate 11/24/20 0827 72  ?   Resp 11/24/20 0827 18  ?   Temp 11/24/20 0827 98.3 ?F (36.8 ?C)  ?   Temp  Source 11/24/20 0827 Oral  ?   SpO2 11/24/20 0827 98 %  ?   Weight --   ?   Height --   ?   Head Circumference --   ?   Peak Flow --   ?   Pain Score 11/24/20 0826 5  ?   Pain Loc --   ?   Pain Edu? --   ?   Excl. in [place]? --   ?No data found. ? ?Updated Vital Signs ?BP 114/78 (BP Location: Left Arm)   Pulse (!) 103   Temp 98.6 ?F (37 ?C) (Oral)   Resp 18   SpO2 95%  ? ?Physical Exam ?Vitals and nursing note reviewed.  ?Constitutional:   ?   General: He is awake. He is not in acute distress. ?   Appearance: Normal appearance. He is well-developed and well-groomed. He is obese. He is not ill-appearing, toxic-appearing or diaphoretic.  ?   Comments: Patient is in a wheelchair  ?HENT:  ?   Head: Normocephalic and atraumatic.  ?Eyes:  ?   General: Lids are normal.     ?   Right eye: No discharge.     ?   Left eye: No discharge.  ?   Extraocular Movements: Extraocular movements intact.  ?   Conjunctiva/sclera: Conjunctivae normal.  ?   Right eye: Right conjunctiva is not injected.  ?   Left eye: Left conjunctiva is not injected.  ?Neck:  ?   Trachea: Trachea and phonation normal.  ?Cardiovascular:  ?   Rate and Rhythm: Normal rate and regular rhythm.  ?   Pulses: Normal pulses.  ?   Heart sounds: Normal heart sounds. No murmur heard. ?  No friction rub. No gallop.  ?Pulmonary:  ?   Effort: Pulmonary effort is normal. No accessory muscle usage, prolonged expiration or respiratory distress.  ?   Breath sounds: Normal breath sounds. No stridor, decreased air movement or transmitted upper airway sounds. No decreased breath sounds, wheezing, rhonchi or rales.  ?Chest:  ?   Chest wall: No tenderness.  ?Musculoskeletal:     ?   General: Normal range of motion.  ?   Cervical back: Normal range of motion and neck supple. Normal range of motion.  ?Lymphadenopathy:  ?   Cervical: No cervical adenopathy.  ?Skin: ?   General: Skin is warm and dry.  ?   Findings: No erythema or rash.  ?Neurological:  ?   General: No focal deficit  present.  ?   Mental Status: He is alert and oriented to person, place, and time.  ?Psychiatric:     ?   Mood and Affect: Mood normal.     ?   Behavior: Behavior normal. Behavior is cooperative.  ? ? ?Visual Acuity ?Right Eye Distance:   ?Left Eye Distance:   ?Bilateral Distance:   ? ?  Right Eye Near:   ?Left Eye Near:    ?Bilateral Near:    ? ?UC Couse / Diagnostics / Procedures:  ?  ?EKG ? ?Radiology ?No results found. ? ?Procedures ?Procedures (including critical care time) ? ?UC Diagnoses / Final Clinical Impressions(s)   ?I have reviewed the triage vital signs and the nursing notes. ? ?Pertinent labs & imaging results that were available during my care of the patient were reviewed by me and considered in my medical decision making (see chart for details).   ? ?Final diagnoses:  ?Type 2 diabetes mellitus without complication, without long-term current use of insulin ([place])  ?Increased frequency of urination  ?Pyuria  ?Elevated serum glucose with glucosuria  ?Proteinuria, unspecified type  ?Hyperglycemia  ? ?Urinalysis has a significant amount of sugar, protein and white blood cells in it.  Urine culture will be performed per protocol.  Patient is a poorly controlled type 2 diabetic, will treat patient empirically for presumed bacterial urinary tract infection until urine culture is complete, will adjust antibiotics as needed.  Return precautions advised.  Patient instructed to take metformin as prescribed. ? ?ED Prescriptions   ? ? Medication Sig Dispense Auth. Provider  ? sulfamethoxazole-trimethoprim (BACTRIM DS) 800-160 MG tablet Take 1 tablet by mouth 2 (two) times daily for 7 days. 14 tablet Lynden Oxford Scales, PA-C  ? ?  ? ?PDMP not reviewed this encounter. ? ?Pending results:  ?Labs Reviewed  ?POCT FASTING [facility]G [facility] MANUAL ENTRY - Abnormal; Notable for the following components:  ?    Result Value  ? POCT Glucose ([facility]) 240 (*)   ? All other components within normal limits  ?POCT URINALYSIS DIP (MANUAL  ENTRY) - Abnormal; Notable for the following components:  ? Clarity, UA hazy (*)   ? Glucose, UA =500 (*)   ? Blood, UA trace-intact (*)   ? Protein Ur, POC =30 (*)   ? Leukocytes, UA Large (3+) (*)   ? All ot

## 2021-05-03 NOTE — ED Triage Notes (Signed)
Pt c/o frequent, cloudy urine that began last Friday.  ?

## 2021-05-06 LAB — URINE CULTURE: Culture: >=100000 — AB

## 2021-05-07 ENCOUNTER — Telehealth (HOSPITAL_COMMUNITY): Payer: Self-pay | Admitting: Emergency Medicine

## 2021-05-07 MED ORDER — NITROFURANTOIN MONOHYD MACRO 100 MG PO CAPS: 100.0000 mg | ORAL_CAPSULE | Freq: Two times a day (BID) | ORAL | 0 refills | Status: DC

## 2021-06-14 ENCOUNTER — Telehealth: Payer: Self-pay

## 2021-06-14 DIAGNOSIS — E119 Type 2 diabetes mellitus without complications: Secondary | ICD-10-CM | POA: Diagnosis not present

## 2021-06-14 DIAGNOSIS — H04123 Dry eye syndrome of bilateral lacrimal glands: Secondary | ICD-10-CM | POA: Diagnosis not present

## 2021-06-14 LAB — HM DIABETES EYE EXAM

## 2021-06-14 NOTE — Telephone Encounter (Signed)
Received eye exam report from [facility]. Placed on MD's desk for review.

## 2021-06-15 DIAGNOSIS — M25511 Pain in right shoulder: Secondary | ICD-10-CM | POA: Diagnosis not present

## 2021-06-22 DIAGNOSIS — N139 Obstructive and reflux uropathy, unspecified: Secondary | ICD-10-CM | POA: Diagnosis not present

## 2021-06-22 DIAGNOSIS — R8271 Bacteriuria: Secondary | ICD-10-CM | POA: Diagnosis not present

## 2021-06-22 DIAGNOSIS — N3000 Acute cystitis without hematuria: Secondary | ICD-10-CM | POA: Diagnosis not present

## 2021-07-05 ENCOUNTER — Telehealth: Payer: Self-pay | Admitting: Neurology

## 2021-07-05 NOTE — Telephone Encounter (Signed)
R/s 8/10 appt with pt over the phone - sarah out

## 2021-07-09 ENCOUNTER — Encounter (HOSPITAL_COMMUNITY)
Admission: RE | Disposition: A | Discharge status: Home / Self Care | Payer: Self-pay | Source: Ambulatory Visit | Attending: Urology

## 2021-07-09 ENCOUNTER — Inpatient Hospital Stay (HOSPITAL_BASED_OUTPATIENT_CLINIC_OR_DEPARTMENT_OTHER): Payer: Medicare PPO | Admitting: Certified Registered"

## 2021-07-09 ENCOUNTER — Inpatient Hospital Stay (HOSPITAL_COMMUNITY): Payer: Medicare PPO

## 2021-07-09 ENCOUNTER — Other Ambulatory Visit: Payer: Self-pay

## 2021-07-09 ENCOUNTER — Observation Stay (HOSPITAL_COMMUNITY)
Admission: RE | Admit: 2021-07-09 | Discharge: 2021-07-10 | Disposition: A | Discharge status: Home / Self Care | Payer: Medicare PPO | Source: Ambulatory Visit | Attending: Urology | Admitting: Urology

## 2021-07-09 ENCOUNTER — Other Ambulatory Visit: Payer: Self-pay | Admitting: Urology

## 2021-07-09 ENCOUNTER — Encounter (HOSPITAL_COMMUNITY): Payer: Self-pay | Admitting: Urology

## 2021-07-09 ENCOUNTER — Inpatient Hospital Stay (HOSPITAL_COMMUNITY): Payer: Medicare PPO | Admitting: Certified Registered"

## 2021-07-09 DIAGNOSIS — N1339 Other hydronephrosis: Secondary | ICD-10-CM | POA: Diagnosis not present

## 2021-07-09 DIAGNOSIS — R1084 Generalized abdominal pain: Secondary | ICD-10-CM | POA: Diagnosis not present

## 2021-07-09 DIAGNOSIS — N132 Hydronephrosis with renal and ureteral calculous obstruction: Principal | ICD-10-CM | POA: Insufficient documentation

## 2021-07-09 DIAGNOSIS — N390 Urinary tract infection, site not specified: Principal | ICD-10-CM | POA: Diagnosis present

## 2021-07-09 DIAGNOSIS — N201 Calculus of ureter: Secondary | ICD-10-CM | POA: Diagnosis not present

## 2021-07-09 DIAGNOSIS — N200 Calculus of kidney: Secondary | ICD-10-CM | POA: Diagnosis not present

## 2021-07-09 DIAGNOSIS — E119 Type 2 diabetes mellitus without complications: Secondary | ICD-10-CM | POA: Diagnosis not present

## 2021-07-09 DIAGNOSIS — N202 Calculus of kidney with calculus of ureter: Secondary | ICD-10-CM | POA: Diagnosis not present

## 2021-07-09 HISTORY — PX: CYSTOSCOPY W/ URETERAL STENT PLACEMENT: SHX1429

## 2021-07-09 HISTORY — DX: Personal history of urinary calculi: Z87.442

## 2021-07-09 LAB — CBC WITH DIFFERENTIAL/PLATELET
Abs Immature Granulocytes: 0.03 10*3/uL (ref 0.00–0.07)
Basophils Absolute: 0 10*3/uL (ref 0.0–0.1)
Basophils Relative: 0 %
Eosinophils Absolute: 0.1 10*3/uL (ref 0.0–0.5)
Eosinophils Relative: 2 %
HCT: 43.2 % (ref 39.0–52.0)
Hemoglobin: 13.5 g/dL (ref 13.0–17.0)
Immature Granulocytes: 1 %
Lymphocytes Relative: 16 %
Lymphs Abs: 0.9 10*3/uL (ref 0.7–4.0)
MCH: 25.2 pg — ABNORMAL LOW (ref 26.0–34.0)
MCHC: 31.3 g/dL (ref 30.0–36.0)
MCV: 80.7 fL (ref 80.0–100.0)
Monocytes Absolute: 0.8 10*3/uL (ref 0.1–1.0)
Monocytes Relative: 14 %
Neutro Abs: 4 10*3/uL (ref 1.7–7.7)
Neutrophils Relative %: 67 %
Platelets: 183 10*3/uL (ref 150–400)
RBC: 5.35 MIL/uL (ref 4.22–5.81)
RDW: 15 % (ref 11.5–15.5)
WBC: 5.8 10*3/uL (ref 4.0–10.5)
nRBC: 0 % (ref 0.0–0.2)

## 2021-07-09 LAB — BASIC METABOLIC PANEL
Anion gap: 11 (ref 5–15)
BUN: 11 mg/dL (ref 6–20)
CO2: 24 mmol/L (ref 22–32)
Calcium: 9.4 mg/dL (ref 8.9–10.3)
Chloride: 101 mmol/L (ref 98–111)
Creatinine, Ser: 1.16 mg/dL (ref 0.61–1.24)
GFR, Estimated: >60 mL/min (ref >60)
Glucose, Bld: 151 mg/dL — ABNORMAL HIGH (ref 70–99)
Potassium: 4.4 mmol/L (ref 3.5–5.1)
Sodium: 136 mmol/L (ref 135–145)

## 2021-07-09 LAB — GLUCOSE, CAPILLARY
Glucose-Capillary: 145 mg/dL — ABNORMAL HIGH (ref 70–99)
Glucose-Capillary: 183 mg/dL — ABNORMAL HIGH (ref 70–99)
Glucose-Capillary: 296 mg/dL — ABNORMAL HIGH (ref 70–99)

## 2021-07-09 SURGERY — CYSTOSCOPY, WITH RETROGRADE PYELOGRAM AND URETERAL STENT INSERTION
Anesthesia: General | Laterality: Left

## 2021-07-09 MED ORDER — CIPROFLOXACIN IN D5W 400 MG/200ML IV SOLN
INTRAVENOUS | Status: AC
  Filled 2021-07-09: qty 200

## 2021-07-09 MED ORDER — DEXAMETHASONE SODIUM PHOSPHATE 10 MG/ML IJ SOLN
INTRAMUSCULAR | Status: DC | PRN
  Administered 2021-07-09: 5 mg via INTRAVENOUS

## 2021-07-09 MED ORDER — ACETAMINOPHEN 10 MG/ML IV SOLN: 1000.0000 mg | Freq: Once | INTRAVENOUS | Status: DC | PRN

## 2021-07-09 MED ORDER — OXYBUTYNIN CHLORIDE 5 MG PO TABS: 5.0000 mg | ORAL_TABLET | Freq: Three times a day (TID) | ORAL | Status: DC | PRN

## 2021-07-09 MED ORDER — ONDANSETRON HCL 4 MG/2ML IJ SOLN
INTRAMUSCULAR | Status: AC
Start: 2021-07-09 — End: ?
  Filled 2021-07-09: qty 2

## 2021-07-09 MED ORDER — OXYCODONE HCL 5 MG PO TABS: 5.0000 mg | ORAL_TABLET | Freq: Once | ORAL | Status: DC | PRN

## 2021-07-09 MED ORDER — HYDROMORPHONE HCL 1 MG/ML IJ SOLN: 0.2500 mg | INTRAMUSCULAR | Status: DC | PRN

## 2021-07-09 MED ORDER — ACETAMINOPHEN 325 MG PO TABS
650.0000 mg | ORAL_TABLET | ORAL | Status: DC | PRN
Start: 2021-07-09 — End: 2021-07-10

## 2021-07-09 MED ORDER — ATORVASTATIN CALCIUM 10 MG PO TABS: 10.0000 mg | ORAL_TABLET | Freq: Every day | ORAL | Status: DC

## 2021-07-09 MED ORDER — SODIUM CHLORIDE 0.45 % IV SOLN: INTRAVENOUS | Status: DC

## 2021-07-09 MED ORDER — ONDANSETRON HCL 4 MG/2ML IJ SOLN
INTRAMUSCULAR | Status: DC | PRN
  Administered 2021-07-09: 4 mg via INTRAVENOUS

## 2021-07-09 MED ORDER — AMISULPRIDE (ANTIEMETIC) 5 MG/2ML IV SOLN: 10.0000 mg | Freq: Once | INTRAVENOUS | Status: DC | PRN

## 2021-07-09 MED ORDER — ALFUZOSIN HCL ER 10 MG PO TB24
10.0000 mg | ORAL_TABLET | Freq: Every day | ORAL | Status: DC
  Administered 2021-07-09 – 2021-07-10 (×2): 10 mg via ORAL
  Filled 2021-07-09 (×2): qty 1

## 2021-07-09 MED ORDER — FENTANYL CITRATE (PF) 100 MCG/2ML IJ SOLN
INTRAMUSCULAR | Status: DC | PRN
  Administered 2021-07-09 (×2): 50 ug via INTRAVENOUS

## 2021-07-09 MED ORDER — ONDANSETRON HCL 4 MG/2ML IJ SOLN: 4.0000 mg | Freq: Once | INTRAMUSCULAR | Status: DC | PRN

## 2021-07-09 MED ORDER — DEXMEDETOMIDINE (PRECEDEX) IN NS 20 MCG/5ML (4 MCG/ML) IV SYRINGE
PREFILLED_SYRINGE | INTRAVENOUS | Status: AC
  Filled 2021-07-09: qty 5

## 2021-07-09 MED ORDER — CIPROFLOXACIN HCL 500 MG PO TABS
500.0000 mg | ORAL_TABLET | Freq: Two times a day (BID) | ORAL | Status: DC
  Administered 2021-07-10: 500 mg via ORAL
  Filled 2021-07-09: qty 1

## 2021-07-09 MED ORDER — ONDANSETRON HCL 4 MG/2ML IJ SOLN
4.0000 mg | INTRAMUSCULAR | Status: DC | PRN
Start: 2021-07-09 — End: 2021-07-10

## 2021-07-09 MED ORDER — PROPOFOL 10 MG/ML IV BOLUS
INTRAVENOUS | Status: AC
Start: 2021-07-09 — End: ?
  Filled 2021-07-09: qty 20

## 2021-07-09 MED ORDER — IOHEXOL 300 MG/ML  SOLN
INTRAMUSCULAR | Status: DC | PRN
  Administered 2021-07-09: 10 mL via URETHRAL

## 2021-07-09 MED ORDER — CIPROFLOXACIN IN D5W 400 MG/200ML IV SOLN
INTRAVENOUS | Status: DC | PRN
  Administered 2021-07-09: 400 mg via INTRAVENOUS

## 2021-07-09 MED ORDER — TRAMADOL HCL 50 MG PO TABS: 50.0000 mg | ORAL_TABLET | Freq: Four times a day (QID) | ORAL | Status: DC | PRN

## 2021-07-09 MED ORDER — MIDAZOLAM HCL 2 MG/2ML IJ SOLN
INTRAMUSCULAR | Status: DC | PRN
  Administered 2021-07-09: 2 mg via INTRAVENOUS

## 2021-07-09 MED ORDER — LIDOCAINE HCL (PF) 2 % IJ SOLN
INTRAMUSCULAR | Status: AC
  Filled 2021-07-09: qty 5

## 2021-07-09 MED ORDER — PROPOFOL 10 MG/ML IV BOLUS
INTRAVENOUS | Status: DC | PRN
  Administered 2021-07-09: 180 mg via INTRAVENOUS

## 2021-07-09 MED ORDER — FOLIC ACID 1 MG PO TABS
1.0000 mg | ORAL_TABLET | Freq: Every day | ORAL | Status: DC
  Administered 2021-07-09 – 2021-07-10 (×2): 1 mg via ORAL
  Filled 2021-07-09 (×2): qty 1

## 2021-07-09 MED ORDER — DEXAMETHASONE SODIUM PHOSPHATE 10 MG/ML IJ SOLN
INTRAMUSCULAR | Status: AC
  Filled 2021-07-09: qty 1

## 2021-07-09 MED ORDER — INSULIN ASPART 100 UNIT/ML IJ SOLN
0.0000 [IU] | Freq: Three times a day (TID) | INTRAMUSCULAR | Status: DC
  Administered 2021-07-09 – 2021-07-10 (×2): 8 [IU] via SUBCUTANEOUS

## 2021-07-09 MED ORDER — CELECOXIB 200 MG PO CAPS
200.0000 mg | ORAL_CAPSULE | Freq: Every day | ORAL | Status: DC
  Administered 2021-07-09: 200 mg via ORAL
  Filled 2021-07-09: qty 1

## 2021-07-09 MED ORDER — SODIUM CHLORIDE 0.9 % IR SOLN
Status: DC | PRN
  Administered 2021-07-09: 3000 mL

## 2021-07-09 MED ORDER — OXYCODONE HCL 5 MG/5ML PO SOLN: 5.0000 mg | Freq: Once | ORAL | Status: DC | PRN

## 2021-07-09 MED ORDER — LIDOCAINE 2% (20 MG/ML) 5 ML SYRINGE
INTRAMUSCULAR | Status: DC | PRN
  Administered 2021-07-09: 100 mg via INTRAVENOUS

## 2021-07-09 MED ORDER — LACTATED RINGERS IV SOLN: INTRAVENOUS | Status: DC

## 2021-07-09 MED ORDER — FENTANYL CITRATE (PF) 100 MCG/2ML IJ SOLN
INTRAMUSCULAR | Status: AC
  Filled 2021-07-09: qty 2

## 2021-07-09 MED ORDER — MIDAZOLAM HCL 2 MG/2ML IJ SOLN
INTRAMUSCULAR | Status: AC
  Filled 2021-07-09: qty 2

## 2021-07-09 SURGICAL SUPPLY — 15 items
BAG URO CATCHER STRL LF (MISCELLANEOUS) ×2 IMPLANT
CATH URETL OPEN 5X70 (CATHETERS) ×2 IMPLANT
CLOTH BEACON ORANGE TIMEOUT ST (SAFETY) ×2 IMPLANT
GLOVE BIO SURGEON STRL SZ7 (GLOVE) ×2 IMPLANT
GLOVE SURG LX 7.5 STRW (GLOVE) ×3
GLOVE SURG LX STRL 7.5 STRW (GLOVE) ×1 IMPLANT
GOWN STRL REUS W/ TWL LRG LVL3 (GOWN DISPOSABLE) ×1 IMPLANT
GOWN STRL REUS W/ TWL XL LVL3 (GOWN DISPOSABLE) ×1 IMPLANT
GOWN STRL REUS W/TWL LRG LVL3 (GOWN DISPOSABLE) ×2
GOWN STRL REUS W/TWL XL LVL3 (GOWN DISPOSABLE)
GUIDEWIRE ZIPWRE .038 STRAIGHT (WIRE) ×2 IMPLANT
MANIFOLD NEPTUNE II (INSTRUMENTS) ×2 IMPLANT
PACK CYSTO (CUSTOM PROCEDURE TRAY) ×2 IMPLANT
STENT URET 6FRX26 CONTOUR (STENTS) ×1 IMPLANT
TUBING CONNECTING 10 (TUBING) ×2 IMPLANT

## 2021-07-09 NOTE — Anesthesia Preprocedure Evaluation (Addendum)
Anesthesia Evaluation  Patient identified by MRN, date of birth, ID band Patient awake    Reviewed: Allergy & Precautions, NPO status , Patient's Chart, lab work & pertinent test results  Airway Mallampati: II  TM Distance: >3 FB Neck ROM: Full    Dental no notable dental hx. (+) Teeth Intact, Dental Advisory Given   Pulmonary neg pulmonary ROS,    Pulmonary exam normal breath sounds clear to auscultation       Cardiovascular Exercise Tolerance: Good Normal cardiovascular exam Rhythm:Regular Rate:Normal     Neuro/Psych Paraplegic negative neurological ROS  negative psych ROS   GI/Hepatic Neg liver ROS,   Endo/Other  diabetes, Type 2  Renal/GU Renal diseaseLab Results      Component                Value               Date                      CREATININE               1.16                07/09/2021                K                        4.4                 07/09/2021                  Musculoskeletal  (+) Arthritis ,   Abdominal (+) + obese (BMI 32),   Peds  Hematology Lab Results      Component                Value               Date                     HGB                      13.5                07/09/2021                HCT                      43.2                07/09/2021                PLT                      183                 07/09/2021              Anesthesia Other Findings All: Duloxetine, Percocet   Reproductive/Obstetrics                        Anesthesia Physical Anesthesia Plan  ASA: 2 and emergent  Anesthesia Plan: General   Post-op Pain Management: Minimal or no pain anticipated   Induction: Intravenous  PONV Risk Score and Plan: 4 or greater and Treatment may vary due to age or medical  condition, Ondansetron and Midazolam  Airway Management Planned: LMA  Additional Equipment: None  Intra-op Plan:   Post-operative Plan:   Informed Consent:     Dental  advisory given  Plan Discussed with:   Anesthesia Plan Comments:         Anesthesia Quick Evaluation

## 2021-07-09 NOTE — Anesthesia Procedure Notes (Signed)
Procedure Name: LMA Insertion Date/Time: 07/09/2021 6:21 PM  Performed by: Raenette Rover, CRNAPre-anesthesia Checklist: Patient identified, Emergency Drugs available, Suction available and Patient being monitored Patient Re-evaluated:Patient Re-evaluated prior to induction Oxygen Delivery Method: Circle system utilized Preoxygenation: Pre-oxygenation with 100% oxygen Induction Type: IV induction Ventilation: Mask ventilation without difficulty LMA: LMA with gastric port inserted LMA Size: 4.0 Number of attempts: 1 Placement Confirmation: positive ETCO2 and breath sounds checked- equal and bilateral Tube secured with: Tape Dental Injury: Teeth and Oropharynx as per pre-operative assessment

## 2021-07-09 NOTE — Op Note (Signed)
Operative Note  Preoperative diagnosis:  1.  Left ureteral stone 2. L hydronephrosis 3. Possible UTI  Postoperative diagnosis: 1.  same  Procedure(s): 1.  Cystoscopy 2. left retrograde pyelogram with interpretation 3. left ureteral stent placement 4. Fluoroscopy <1 hour with intraoperative interpretation  Surgeon: Donald Pore, MD  Assistants:  None  Anesthesia:  General  Complications:  None  EBL:  minimal  Specimens: 1. Urine culture from renal pelvis  Drains/Catheters: 1.  Left 6Fr x 26cm ureteral stent  Intraoperative findings:   Cystoscopy demonstrated no suspicious lesions, masses, stones or other pathology. Retrograde pyelogram demonstrated moderate hydronephrosis. Successful left ureteral stent placement with curl in the renal pelvis and bladder respectively.  Indication:  Victor Christensen is a 51 y.o. male with a left ureteral stone. After reviewing the management options for treatment, he elected to proceed with the above surgical procedure(s). We have discussed the potential benefits and risks of the procedure, side effects of the proposed treatment, the likelihood of the patient achieving the goals of the procedure, and any potential problems that might occur during the procedure or recuperation. Informed consent has been obtained.  Description of procedure: The patient was taken to the operating room and general anesthesia was induced.  The patient was placed in the dorsal lithotomy position, prepped and draped in the usual sterile fashion, and preoperative antibiotics were administered. A preoperative time-out was performed.   Cystourethroscopy was performed.  The patient's urethra was examined and was normal. There was some bilobar prostatic hypertrophy. The bladder was then systematically examined in its entirety. There was no evidence for any bladder tumors, stones, or other mucosal pathology.    Attention then turned to the left ureteral orifice. A 0.038 zip  wire was passed through the left orifice and over the wire a 5 Fr open ended catheter was inserted and passed up to the level of the renal pelvis. Aspirate was obtained and sent off as renal pelvis urine for culture. Omnipaque contrast was injected through the ureteral catheter and a retrograde pyelogram was performed with findings as dictated above. The wire was then replaced and the open ended catheter was removed.   A 6Fr x 26cm ureteral stent was advance over the wire. The stent was positioned appropriately under fluoroscopic and cystoscopic guidance.  The wire was then removed with an adequate stent curl noted in the renal pelvis as well as in the bladder.  The bladder was then emptied and the procedure ended.  The patient appeared to tolerate the procedure well and without complications.  The patient was able to be awakened and transferred to the recovery unit in satisfactory condition.   Plan:  admit overnight, will re-assess in AM  Vira Agar, MD

## 2021-07-09 NOTE — H&P (Signed)
H&P  History of Present Illness: Victor Christensen is a 51 y.o. year old with an obstructing proximal left ureteral stone in the setting of a UTI who presents for urgent stent placement  [name] endorses pain on the contralateral flank. He was febrile in clinic earlier today and endorsed subjective fever symptoms over the weekend.  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[place])     Past Surgical History:  Procedure Laterality Date   CYSTOSCOPY WITH STENT PLACEMENT Left 01/15/2018   Procedure: CYSTOSCOPY WITH STENT PLACEMENT retrograde pylegram;  Surgeon: Franchot Gallo, MD;  Location: [facility];  Service: Urology;  Laterality: Left;   CYSTOSCOPY/URETEROSCOPY/HOLMIUM LASER/STENT PLACEMENT Left 02/05/2018   Procedure: CYSTOSCOPY/URETEROSCOPY/HOLMIUM LASER/STENT EXTRACTION WITH LEFT STENT PLACEMENT;  Surgeon: Franchot Gallo, MD;  Location: [facility];  Service: Urology;  Laterality: Left;    Home Medications:  No outpatient medications have been marked as taking for the 07/09/21 encounter [facility] Encounter).    Allergies:  Allergies  Allergen Reactions   Duloxetine Diarrhea and Other (See Comments)    insomnia   Oxycodone-Acetaminophen Hives and Other (See Comments)    Makes him loopy     Family History  Problem Relation Age of Onset   Hypertension Mother    Hypertension Father    Diabetes Father     Social History:  reports that he has never smoked. He has never used smokeless tobacco. He reports that he does not currently use alcohol. He reports that he does not use drugs.  ROS: A complete review of systems was performed.  All systems are negative except for pertinent findings as noted.  Physical Exam:  Vital signs in last 24 hours:   Constitutional:  Alert and oriented, No acute distress Cardiovascular:  Regular rate and rhythm, No JVD Respiratory: Normal respiratory effort, Lungs clear bilaterally GI: Abdomen is soft, nontender, nondistended, no abdominal masses GU: No CVA tenderness Lymphatic: No lymphadenopathy Neurologic: Grossly intact, no focal deficits Psychiatric: Normal mood and affect Drains/Tubes: none   Laboratory Data:  No results for input(s): "WBC", "HGB", "HCT", "PLT" in the last 72 hours.  No results for input(s): "NA", "K", "CL", "GLUCOSE", "BUN", "CALCIUM", "CREATININE" in the last 72 hours.  Invalid input(s): "CO3"   No results found for this or any previous visit (from the past 24 hour(s)). No results found for this or any previous visit (from the past 240 hour(s)).  Renal Function: No results for input(s): "CREATININE" in the last 168 hours. CrCl cannot be calculated (Patient's most recent lab result is older than the maximum 21 days allowed.).  Radiologic Imaging: No results found.  Assessment:  51 yo M with proximal left ureteral stone, hydronephrosis in the setting of a likely UTI  Plan:  -to OR for left ureteral stent placement urgently. We discussed the likely need for admission afterwards. Risks of the procedure and need for a future staged procedure to address the stones reviewed and all questions answered.  Donald Pore, MD 07/09/2021, 3:33 PM  [facility] Pager: [phone]

## 2021-07-09 NOTE — Transfer of Care (Signed)
Immediate Anesthesia Transfer of Care Note  Patient: Victor Christensen  Procedure(s) Performed: CYSTOSCOPY WITH RETROGRADE PYELOGRAM/URETERAL STENT PLACEMENT (Left)  Patient Location: PACU  Anesthesia Type:General  Level of Consciousness: awake, alert , oriented, drowsy and patient cooperative  Airway & Oxygen Therapy: Patient Spontanous Breathing and Patient connected to face mask oxygen  Post-op Assessment: Report given to RN and Post -op Vital signs reviewed and stable  Post vital signs: Reviewed and stable  Last Vitals:  Vitals Value Taken Time  BP 102/75 07/09/21 1854  Temp 36.6 C 07/09/21 1854  Pulse    Resp 17 07/09/21 1854  SpO2 99 % 07/09/21 1854    Last Pain:  Vitals:   07/09/21 1608  TempSrc: Oral         Complications: No notable events documented.

## 2021-07-09 NOTE — Anesthesia Postprocedure Evaluation (Signed)
Anesthesia Post Note  Patient: Victor Christensen  Procedure(s) Performed: CYSTOSCOPY WITH RETROGRADE PYELOGRAM/URETERAL STENT PLACEMENT (Left)     Patient location during evaluation: PACU Anesthesia Type: General Level of consciousness: awake and alert Pain management: pain level controlled Vital Signs Assessment: post-procedure vital signs reviewed and stable Respiratory status: spontaneous breathing, nonlabored ventilation, respiratory function stable and patient connected to nasal cannula oxygen Cardiovascular status: blood pressure returned to baseline and stable Postop Assessment: no apparent nausea or vomiting Anesthetic complications: no   No notable events documented.  Last Vitals:  Vitals:   07/09/21 1930 07/09/21 1954  BP: 109/71 105/77  Pulse: 77 75  Resp: 15 18  Temp: 36.6 C 36.6 C  SpO2: 95% 98%    Last Pain:  Vitals:   07/09/21 1954  TempSrc: Oral  PainSc: 0-No pain                 Barnet Glasgow

## 2021-07-10 ENCOUNTER — Other Ambulatory Visit: Payer: Self-pay | Admitting: Urology

## 2021-07-10 ENCOUNTER — Encounter (HOSPITAL_COMMUNITY): Payer: Self-pay | Admitting: Urology

## 2021-07-10 DIAGNOSIS — N132 Hydronephrosis with renal and ureteral calculous obstruction: Secondary | ICD-10-CM | POA: Diagnosis not present

## 2021-07-10 DIAGNOSIS — E119 Type 2 diabetes mellitus without complications: Secondary | ICD-10-CM | POA: Diagnosis not present

## 2021-07-10 LAB — URINE CULTURE: Culture: <10000 — AB

## 2021-07-10 LAB — CBC
HCT: 42.4 % (ref 39.0–52.0)
Hemoglobin: 13.1 g/dL (ref 13.0–17.0)
MCH: 25 pg — ABNORMAL LOW (ref 26.0–34.0)
MCHC: 30.9 g/dL (ref 30.0–36.0)
MCV: 81.1 fL (ref 80.0–100.0)
Platelets: 180 10*3/uL (ref 150–400)
RBC: 5.23 MIL/uL (ref 4.22–5.81)
RDW: 15 % (ref 11.5–15.5)
WBC: 4 10*3/uL (ref 4.0–10.5)
nRBC: 0 % (ref 0.0–0.2)

## 2021-07-10 LAB — GLUCOSE, CAPILLARY
Glucose-Capillary: 291 mg/dL — ABNORMAL HIGH (ref 70–99)
Glucose-Capillary: 274 mg/dL — ABNORMAL HIGH (ref 70–99)

## 2021-07-10 MED ORDER — CIPROFLOXACIN HCL 500 MG PO TABS: 500.0000 mg | ORAL_TABLET | Freq: Two times a day (BID) | ORAL | 0 refills | Status: DC

## 2021-07-10 MED ORDER — TRAMADOL HCL 50 MG PO TABS
50.0000 mg | ORAL_TABLET | Freq: Four times a day (QID) | ORAL | 0 refills | Status: DC | PRN
Start: 2021-07-10 — End: 2021-07-24

## 2021-07-10 MED ORDER — CELECOXIB 200 MG PO CAPS: 200.0000 mg | ORAL_CAPSULE | Freq: Every day | ORAL | 1 refills | Status: DC

## 2021-07-10 NOTE — Discharge Instructions (Signed)
[facility] [phone] Post Ureteroscopy With or Without Stent Instructions  Definitions:  Ureter: The duct that transports urine from the kidney to the bladder. Stent:   A plastic hollow tube that is placed into the ureter, from the kidney to the bladder to prevent the ureter from swelling shut.  GENERAL INSTRUCTIONS:  Despite the fact that no skin incisions were used, the area around the ureter and bladder is raw and irritated. The stent is a foreign body which will further irritate the bladder wall. This irritation is manifested by increased frequency of urination, both day and night, and by an increase in the urge to urinate. In some, the urge to urinate is present almost always. Sometimes the urge is strong enough that you may not be able to stop yourself from urinating. The only real cure is to remove the stent and then give time for the bladder wall to heal which can't be done until the danger of the ureter swelling shut has passed, which varies.  You may see some blood in your urine while the stent is in place and a few days afterwards. Do not be alarmed, even if the urine was clear for a while. Get off your feet and drink lots of fluids until clearing occurs. If you start to pass clots or don't improve, call us.  DIET: You may return to your normal diet immediately. Because of the raw surface of your bladder, alcohol, spicy foods, acid type foods and drinks with caffeine may cause irritation or frequency and should be used in moderation. To keep your urine flowing freely and to avoid constipation, drink plenty of fluids during the day ( 8-10 glasses ). Tip: Avoid cranberry juice because it is very acidic.  ACTIVITY: Your physical activity doesn't need to be restricted. However, if you are very active, you may see some blood in your urine. We suggest that you reduce your activity under these circumstances until the bleeding has stopped.  BOWELS: It is important to  keep your bowels regular during the postoperative period. Straining with bowel movements can cause bleeding. A bowel movement every other day is reasonable. Use a mild laxative if needed, such as Milk of Magnesia 2-3 tablespoons, or 2 Dulcolax tablets. Call if you continue to have problems. If you have been taking narcotics for pain, before, during or after your surgery, you may be constipated. Take a laxative if necessary.   MEDICATION: You should resume your pre-surgery medications unless told not to. In addition you will often be given an antibiotic to prevent infection and likely several as needed medications for stent related discomfort. These should be taken as prescribed until the bottles are finished unless you are having an unusual reaction to one of the drugs.  PROBLEMS YOU SHOULD REPORT TO [place]: Fevers over 100.5 Fahrenheit. Heavy bleeding, or clots ( See above notes about blood in urine ). Inability to urinate. Drug reactions ( hives, rash, nausea, vomiting, diarrhea ). Severe burning or pain with urination that is not improving.

## 2021-07-10 NOTE — Discharge Summary (Signed)
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   Devic's disease ([place]) 05/20/2013   ED (erectile dysfunction) 06/01/2013   Lumbar spondylosis with myelopathy 05/20/2013   Neurogenic bladder 03/28/2011   Type 2 diabetes mellitus without complication, without long-term current use of insulin ([place]) 10/09/2018   Vitamin B12 deficiency 07/19/2019   Wheelchair confinement status 02/01/2021   Encounter for general adult medical examination with abnormal findings 04/05/2021   Dyslipidemia, goal LDL below 100 04/05/2021   Need for hepatitis C screening test 04/05/2021   Screening for prostate cancer 04/05/2021   Resolved Ambulatory Problems    Diagnosis Date Noted   Ureteral calculus, left 01/15/2018   Acute kidney injury ([place]) 01/16/2016   Bilateral lower extremity edema 08/28/2015   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 Briefly, Victor Christensen is a 51 y.o. year old patient who was admitted after ureteral stent placement (left).   Hospital Course: unremarkable  On the day of discharge, the patient was tolerating a regular diet and their pain was well controlled. They were determined to be stable for discharge home  Laboratory  values:  Recent Labs    07/09/21 1549 07/10/21 0412  HGB 13.5 13.1  HCT 43.2 42.4   Recent Labs    07/09/21 1549  CREATININE 1.16    Disposition: Home  Discharge medications:  Allergies as of 07/10/2021       Reactions   Duloxetine Diarrhea, Other (See Comments)   insomnia   Oxycodone-acetaminophen Other (See Comments)   Makes him loopy        Medication List     STOP taking these medications    diazepam 5 MG tablet Commonly known as: Valium   nitrofurantoin (macrocrystal-monohydrate) 100 MG capsule Commonly known as: MACROBID       TAKE these medications    alfuzosin 10 MG 24 hr tablet Commonly known as: UROXATRAL Take 10 mg by mouth daily.   atorvastatin 10 MG tablet Commonly known as: LIPITOR Take 1 tablet (10 mg total) by mouth daily.   celecoxib 200 MG capsule Commonly known as: CELEBREX Take 1 capsule (200 mg total) by mouth daily. Take while stent in place   ciprofloxacin 500 MG tablet Commonly known as: CIPRO Take 1 tablet (500 mg total) by mouth 2 (two) times daily.   folic acid 1 MG tablet Commonly known as: FOLVITE Take 1 tablet (1 mg total) by mouth daily.   gabapentin 300 MG capsule Commonly known as: NEURONTIN Take 4 capsules (1,200 mg total) by mouth 3 (three) times daily. Please call [phone] to schedule appt for continued refills.   metFORMIN 500 MG tablet Commonly known as: GLUCOPHAGE Take 1 tablet (500 mg total) by mouth 3 (three) times daily with meals.   traMADol 50  MG tablet Commonly known as: ULTRAM Take 1 tablet (50 mg total) by mouth every 6 (six) hours as needed for moderate pain or severe pain.   vitamin B-12 1000 MCG tablet Commonly known as: CYANOCOBALAMIN Take 1,000 mcg by mouth daily.   VITAMIN D3 PO Take 2,000 Units by mouth daily.        Followup:

## 2021-07-10 NOTE — Plan of Care (Signed)

## 2021-07-10 NOTE — Progress Notes (Signed)
1 Day Post-Op Subjective: No acute events overnight. Afebrile. Tolerating diet and pain controlled  Objective: Vital signs in last 24 hours: Temp:  [97.8 F (36.6 C)-98.8 F (37.1 C)] 97.9 F (36.6 C) (06/13 0415) Pulse Rate:  [55-112] 55 (06/13 0415) Resp:  [12-18] 18 (06/13 0415) BP: (102-143)/(70-88) 143/88 (06/13 0415) SpO2:  [95 %-100 %] 98 % (06/13 0415) Weight:  [104.3 kg] 104.3 kg (06/12 1536)  Intake/Output from previous day: 06/12 0701 - 06/13 0700 In: 1505.9 [I.V.:1305.9; IV Piggyback:200] Out: 1300 [Urine:1300] Intake/Output this shift: Total I/O In: 305.9 [I.V.:305.9] Out: 1300 [Urine:1300]  Physical Exam:  General: Alert and oriented CV: RRR Lungs: Clear Abdomen: Soft, ND, ATTP Ext: NT, No erythema  Lab Results: Recent Labs    07/09/21 1549 07/10/21 0412  HGB 13.5 13.1  HCT 43.2 42.4   BMET Recent Labs    07/09/21 1549  NA 136  K 4.4  CL 101  CO2 24  GLUCOSE 151*  BUN 11  CREATININE 1.16  CALCIUM 9.4     Studies/Results: DG C-Arm 1-60 Min-No Report  Result Date: 07/09/2021 Fluoroscopy was utilized by the requesting physician.  No radiographic interpretation.    Assessment/Plan: Victor Christensen is POD 1 s/p left ureteral stent placement for stone in the setting of possible infection, doing well  -home later this AM -will make plans for staged ureteroscopy    LOS: 1 day   Donald Pore MD 07/10/2021, 6:58 AM [facility]  Pager: [phone]

## 2021-07-11 ENCOUNTER — Inpatient Hospital Stay (HOSPITAL_COMMUNITY): Admission: RE | Admit: 2021-07-11 | Payer: Medicare PPO | Source: Ambulatory Visit

## 2021-07-23 ENCOUNTER — Encounter (HOSPITAL_COMMUNITY): Payer: Self-pay | Admitting: Urology

## 2021-07-23 ENCOUNTER — Other Ambulatory Visit: Payer: Self-pay

## 2021-07-24 ENCOUNTER — Ambulatory Visit (HOSPITAL_COMMUNITY): Payer: Medicare PPO

## 2021-07-24 ENCOUNTER — Ambulatory Visit (HOSPITAL_COMMUNITY)
Admission: RE | Admit: 2021-07-24 | Discharge: 2021-07-24 | Disposition: A | Discharge status: Home / Self Care | Payer: Medicare PPO | Attending: Urology | Admitting: Urology

## 2021-07-24 ENCOUNTER — Ambulatory Visit (HOSPITAL_COMMUNITY): Payer: Medicare PPO | Admitting: Physician Assistant

## 2021-07-24 ENCOUNTER — Encounter (HOSPITAL_COMMUNITY)
Admission: RE | Disposition: A | Discharge status: Home / Self Care | Payer: Self-pay | Source: Home / Self Care | Attending: Urology

## 2021-07-24 ENCOUNTER — Encounter (HOSPITAL_COMMUNITY): Payer: Self-pay | Admitting: Urology

## 2021-07-24 ENCOUNTER — Ambulatory Visit (HOSPITAL_BASED_OUTPATIENT_CLINIC_OR_DEPARTMENT_OTHER): Payer: Medicare PPO | Admitting: Physician Assistant

## 2021-07-24 DIAGNOSIS — Z7984 Long term (current) use of oral hypoglycemic drugs: Secondary | ICD-10-CM | POA: Insufficient documentation

## 2021-07-24 DIAGNOSIS — G8220 Paraplegia, unspecified: Secondary | ICD-10-CM | POA: Diagnosis not present

## 2021-07-24 DIAGNOSIS — N138 Other obstructive and reflux uropathy: Secondary | ICD-10-CM | POA: Diagnosis not present

## 2021-07-24 DIAGNOSIS — N135 Crossing vessel and stricture of ureter without hydronephrosis: Secondary | ICD-10-CM

## 2021-07-24 DIAGNOSIS — E119 Type 2 diabetes mellitus without complications: Secondary | ICD-10-CM | POA: Diagnosis not present

## 2021-07-24 DIAGNOSIS — N202 Calculus of kidney with calculus of ureter: Secondary | ICD-10-CM | POA: Diagnosis not present

## 2021-07-24 DIAGNOSIS — N200 Calculus of kidney: Secondary | ICD-10-CM | POA: Diagnosis not present

## 2021-07-24 HISTORY — PX: CYSTOSCOPY/URETEROSCOPY/HOLMIUM LASER/STENT PLACEMENT: SHX6546

## 2021-07-24 LAB — BASIC METABOLIC PANEL
Anion gap: 10 (ref 5–15)
BUN: 21 mg/dL — ABNORMAL HIGH (ref 6–20)
CO2: 24 mmol/L (ref 22–32)
Calcium: 9 mg/dL (ref 8.9–10.3)
Chloride: 101 mmol/L (ref 98–111)
Creatinine, Ser: 1.37 mg/dL — ABNORMAL HIGH (ref 0.61–1.24)
GFR, Estimated: >60 mL/min (ref >60)
Glucose, Bld: 214 mg/dL — ABNORMAL HIGH (ref 70–99)
Potassium: 4.8 mmol/L (ref 3.5–5.1)
Sodium: 135 mmol/L (ref 135–145)

## 2021-07-24 LAB — GLUCOSE, CAPILLARY
Glucose-Capillary: 225 mg/dL — ABNORMAL HIGH (ref 70–99)
Glucose-Capillary: 198 mg/dL — ABNORMAL HIGH (ref 70–99)

## 2021-07-24 LAB — CBC WITH DIFFERENTIAL/PLATELET
Abs Immature Granulocytes: 0.19 10*3/uL — ABNORMAL HIGH (ref 0.00–0.07)
Basophils Absolute: 0 10*3/uL (ref 0.0–0.1)
Basophils Relative: 0 %
Eosinophils Absolute: 0.1 10*3/uL (ref 0.0–0.5)
Eosinophils Relative: 1 %
HCT: 39.2 % (ref 39.0–52.0)
Hemoglobin: 11.9 g/dL — ABNORMAL LOW (ref 13.0–17.0)
Immature Granulocytes: 2 %
Lymphocytes Relative: 11 %
Lymphs Abs: 0.9 10*3/uL (ref 0.7–4.0)
MCH: 24.7 pg — ABNORMAL LOW (ref 26.0–34.0)
MCHC: 30.4 g/dL (ref 30.0–36.0)
MCV: 81.5 fL (ref 80.0–100.0)
Monocytes Absolute: 0.7 10*3/uL (ref 0.1–1.0)
Monocytes Relative: 8 %
Neutro Abs: 6.5 10*3/uL (ref 1.7–7.7)
Neutrophils Relative %: 78 %
Platelets: 441 10*3/uL — ABNORMAL HIGH (ref 150–400)
RBC: 4.81 MIL/uL (ref 4.22–5.81)
RDW: 14.7 % (ref 11.5–15.5)
WBC: 8.3 10*3/uL (ref 4.0–10.5)
nRBC: 0 % (ref 0.0–0.2)

## 2021-07-24 SURGERY — CYSTOSCOPY/URETEROSCOPY/HOLMIUM LASER/STENT PLACEMENT
Anesthesia: General | Laterality: Left

## 2021-07-24 MED ORDER — MIDAZOLAM HCL 5 MG/5ML IJ SOLN
INTRAMUSCULAR | Status: DC | PRN
  Administered 2021-07-24: 2 mg via INTRAVENOUS

## 2021-07-24 MED ORDER — CHLORHEXIDINE GLUCONATE 0.12 % MT SOLN
15.0000 mL | Freq: Once | OROMUCOSAL | Status: AC
  Administered 2021-07-24: 15 mL via OROMUCOSAL

## 2021-07-24 MED ORDER — FENTANYL CITRATE PF 50 MCG/ML IJ SOSY: 25.0000 ug | PREFILLED_SYRINGE | INTRAMUSCULAR | Status: DC | PRN

## 2021-07-24 MED ORDER — ONDANSETRON HCL 4 MG/2ML IJ SOLN
INTRAMUSCULAR | Status: AC
  Filled 2021-07-24: qty 2

## 2021-07-24 MED ORDER — TRAMADOL HCL 50 MG PO TABS: 50.0000 mg | ORAL_TABLET | Freq: Four times a day (QID) | ORAL | 0 refills | Status: DC | PRN

## 2021-07-24 MED ORDER — SODIUM CHLORIDE 0.9 % IR SOLN
Status: DC | PRN
  Administered 2021-07-24: 6000 mL

## 2021-07-24 MED ORDER — DEXAMETHASONE SODIUM PHOSPHATE 10 MG/ML IJ SOLN
INTRAMUSCULAR | Status: AC
  Filled 2021-07-24: qty 1

## 2021-07-24 MED ORDER — ONDANSETRON HCL 4 MG/2ML IJ SOLN
INTRAMUSCULAR | Status: DC | PRN
  Administered 2021-07-24: 4 mg via INTRAVENOUS

## 2021-07-24 MED ORDER — AMISULPRIDE (ANTIEMETIC) 5 MG/2ML IV SOLN: 10.0000 mg | Freq: Once | INTRAVENOUS | Status: DC | PRN

## 2021-07-24 MED ORDER — OXYCODONE HCL 5 MG/5ML PO SOLN: 5.0000 mg | Freq: Once | ORAL | Status: DC | PRN

## 2021-07-24 MED ORDER — CIPROFLOXACIN IN D5W 400 MG/200ML IV SOLN
400.0000 mg | INTRAVENOUS | Status: AC
  Administered 2021-07-24: 400 mg via INTRAVENOUS
  Filled 2021-07-24: qty 200

## 2021-07-24 MED ORDER — PROPOFOL 10 MG/ML IV BOLUS
INTRAVENOUS | Status: AC
  Filled 2021-07-24: qty 20

## 2021-07-24 MED ORDER — DEXAMETHASONE SODIUM PHOSPHATE 10 MG/ML IJ SOLN
INTRAMUSCULAR | Status: DC | PRN
  Administered 2021-07-24: 5 mg via INTRAVENOUS

## 2021-07-24 MED ORDER — ORAL CARE MOUTH RINSE: 15.0000 mL | Freq: Once | OROMUCOSAL | Status: AC

## 2021-07-24 MED ORDER — OXYCODONE HCL 5 MG PO TABS: 5.0000 mg | ORAL_TABLET | Freq: Once | ORAL | Status: DC | PRN

## 2021-07-24 MED ORDER — CIPROFLOXACIN HCL 500 MG PO TABS: 500.0000 mg | ORAL_TABLET | Freq: Two times a day (BID) | ORAL | 0 refills | Status: DC

## 2021-07-24 MED ORDER — ONDANSETRON HCL 4 MG/2ML IJ SOLN: 4.0000 mg | Freq: Once | INTRAMUSCULAR | Status: DC | PRN

## 2021-07-24 MED ORDER — LIDOCAINE HCL (PF) 2 % IJ SOLN
INTRAMUSCULAR | Status: AC
  Filled 2021-07-24: qty 5

## 2021-07-24 MED ORDER — IOHEXOL 300 MG/ML  SOLN
INTRAMUSCULAR | Status: DC | PRN
  Administered 2021-07-24: 10 mL

## 2021-07-24 MED ORDER — FENTANYL CITRATE (PF) 100 MCG/2ML IJ SOLN
INTRAMUSCULAR | Status: AC
  Filled 2021-07-24: qty 2

## 2021-07-24 MED ORDER — PROPOFOL 10 MG/ML IV BOLUS
INTRAVENOUS | Status: DC | PRN
  Administered 2021-07-24: 200 mg via INTRAVENOUS

## 2021-07-24 MED ORDER — LIDOCAINE 2% (20 MG/ML) 5 ML SYRINGE
INTRAMUSCULAR | Status: DC | PRN
  Administered 2021-07-24: 100 mg via INTRAVENOUS

## 2021-07-24 MED ORDER — PROPOFOL 10 MG/ML IV BOLUS
INTRAVENOUS | Status: AC
Start: 2021-07-24 — End: ?
  Filled 2021-07-24: qty 20

## 2021-07-24 MED ORDER — MIDAZOLAM HCL 2 MG/2ML IJ SOLN
INTRAMUSCULAR | Status: AC
  Filled 2021-07-24: qty 2

## 2021-07-24 MED ORDER — LACTATED RINGERS IV SOLN: INTRAVENOUS | Status: DC

## 2021-07-24 MED ORDER — ACETAMINOPHEN 500 MG PO TABS
1000.0000 mg | ORAL_TABLET | Freq: Once | ORAL | Status: AC
  Administered 2021-07-24: 1000 mg via ORAL
  Filled 2021-07-24: qty 2

## 2021-07-24 MED ORDER — FENTANYL CITRATE (PF) 100 MCG/2ML IJ SOLN
INTRAMUSCULAR | Status: DC | PRN
  Administered 2021-07-24: 50 ug via INTRAVENOUS
  Administered 2021-07-24 (×2): 25 ug via INTRAVENOUS

## 2021-07-24 SURGICAL SUPPLY — 26 items
BAG COUNTER SPONGE SURGICOUNT (BAG) IMPLANT
BAG URO CATCHER STRL LF (MISCELLANEOUS) ×2 IMPLANT
BASKET ZERO TIP NITINOL 2.4FR (BASKET) ×1 IMPLANT
CATH URETERAL DUAL LUMEN 10F (MISCELLANEOUS) ×2 IMPLANT
CATH URETL OPEN END 6FR 70 (CATHETERS) IMPLANT
CLOTH BEACON ORANGE TIMEOUT ST (SAFETY) ×2 IMPLANT
GLOVE SS BIOGEL STRL SZ 7 (GLOVE) ×1 IMPLANT
GLOVE SUPERSENSE BIOGEL SZ 7 (GLOVE) ×1
GLOVE SURG LX 7.5 STRW (GLOVE) ×1
GLOVE SURG LX STRL 7.5 STRW (GLOVE) ×1 IMPLANT
GOWN STRL REUS W/ TWL XL LVL3 (GOWN DISPOSABLE) ×1 IMPLANT
GOWN STRL REUS W/TWL XL LVL3 (GOWN DISPOSABLE) ×1
GUIDEWIRE STR DUAL SENSOR (WIRE) ×2 IMPLANT
GUIDEWIRE ZIPWRE .038 STRAIGHT (WIRE) ×2 IMPLANT
IV NS 1000ML (IV SOLUTION) ×1
IV NS 1000ML BAXH (IV SOLUTION) ×1 IMPLANT
KIT TURNOVER KIT A (KITS) IMPLANT
LASER FIB FLEXIVA PULSE ID 365 (Laser) IMPLANT
MANIFOLD NEPTUNE II (INSTRUMENTS) ×2 IMPLANT
PACK CYSTO (CUSTOM PROCEDURE TRAY) ×2 IMPLANT
SHEATH NAVIGATOR HD 11/13X36 (SHEATH) IMPLANT
SHEATH NAVIGATOR HD 12/14X46 (SHEATH) ×1 IMPLANT
TRACTIP FLEXIVA PULS ID 200XHI (Laser) IMPLANT
TRACTIP FLEXIVA PULSE ID 200 (Laser) ×1 IMPLANT
TUBING CONNECTING 10 (TUBING) ×2 IMPLANT
TUBING UROLOGY SET (TUBING) ×2 IMPLANT

## 2021-07-25 ENCOUNTER — Encounter (HOSPITAL_COMMUNITY): Payer: Self-pay | Admitting: Urology

## 2021-07-25 LAB — HEMOGLOBIN A1C
Hgb A1c MFr Bld: 8.6 % — ABNORMAL HIGH (ref 4.8–5.6)
Mean Plasma Glucose: 200 mg/dL

## 2021-07-31 IMAGING — CR DG CHEST 2V
3 series · 3 of 3 positions shown · non-contrast
Comparison: 01/15/2018

CLINICAL DATA: Question sarcoid.

EXAM:
CHEST - 2 VIEW

[x chest ap]
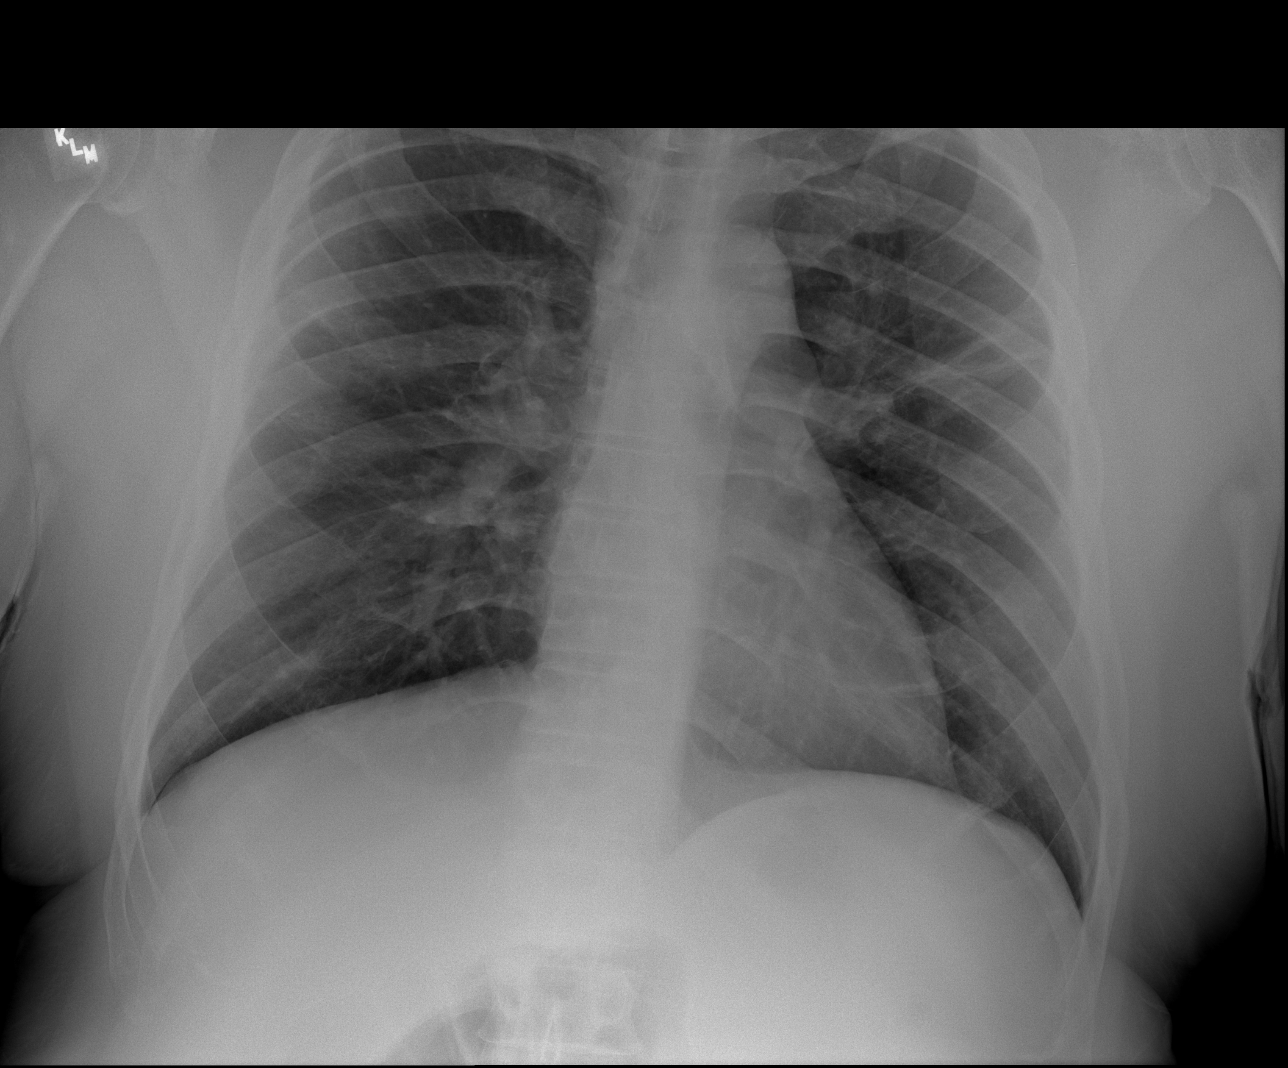

[w chest lat]
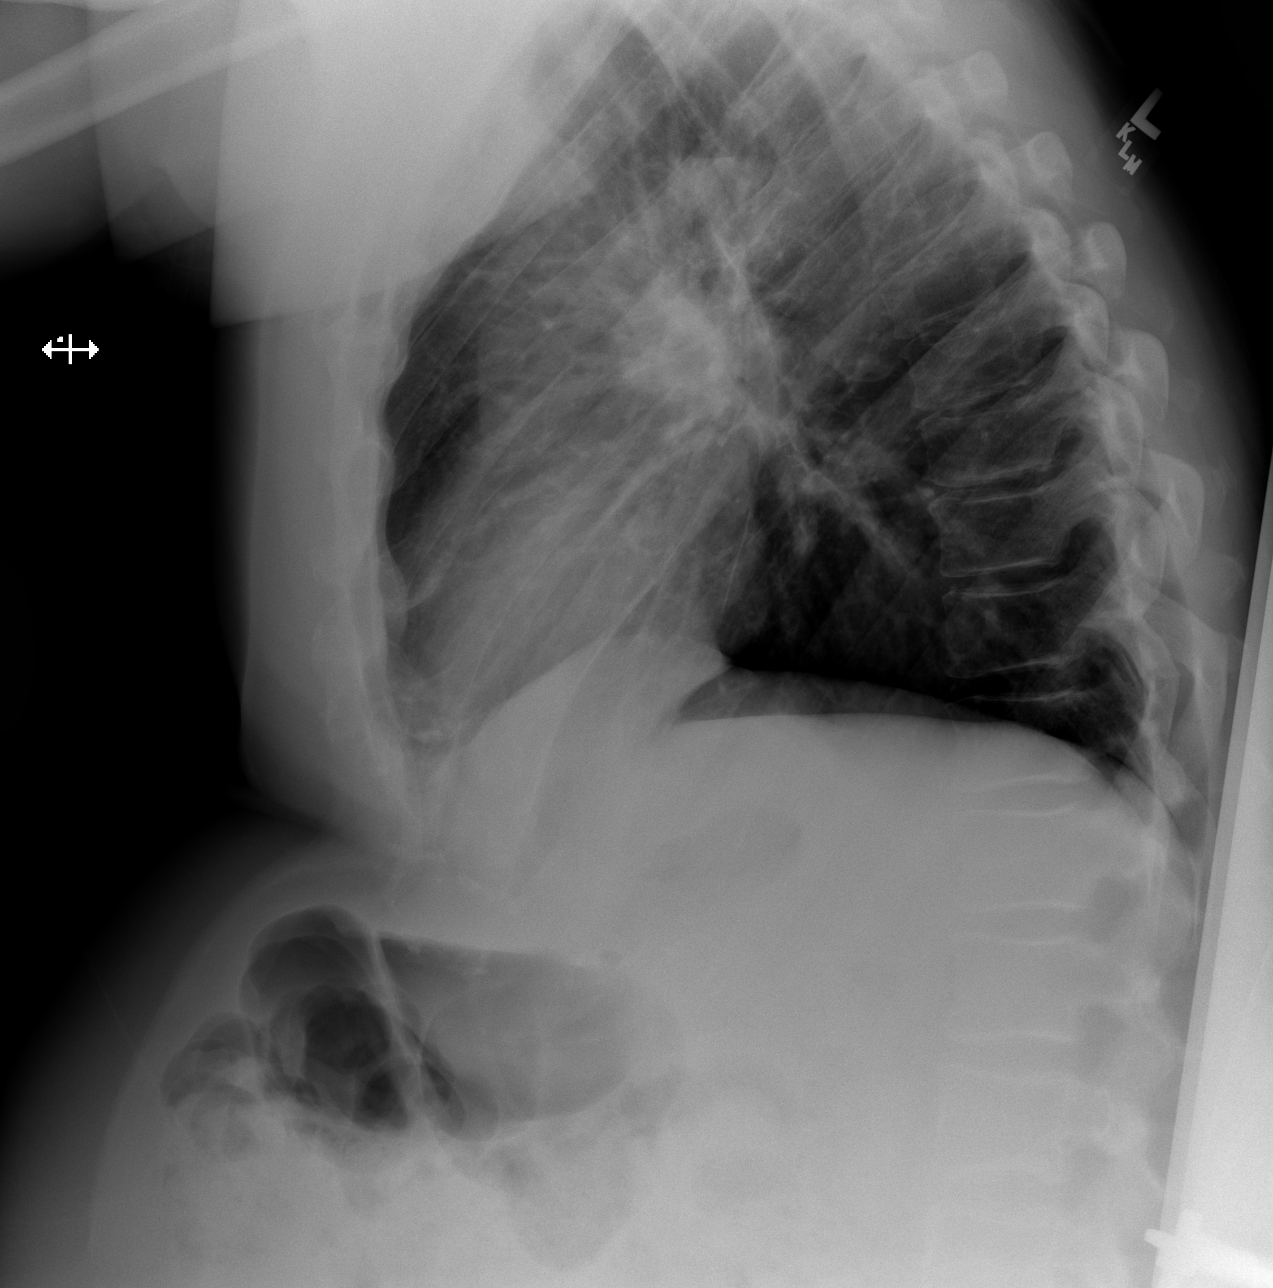

[w chest decub]
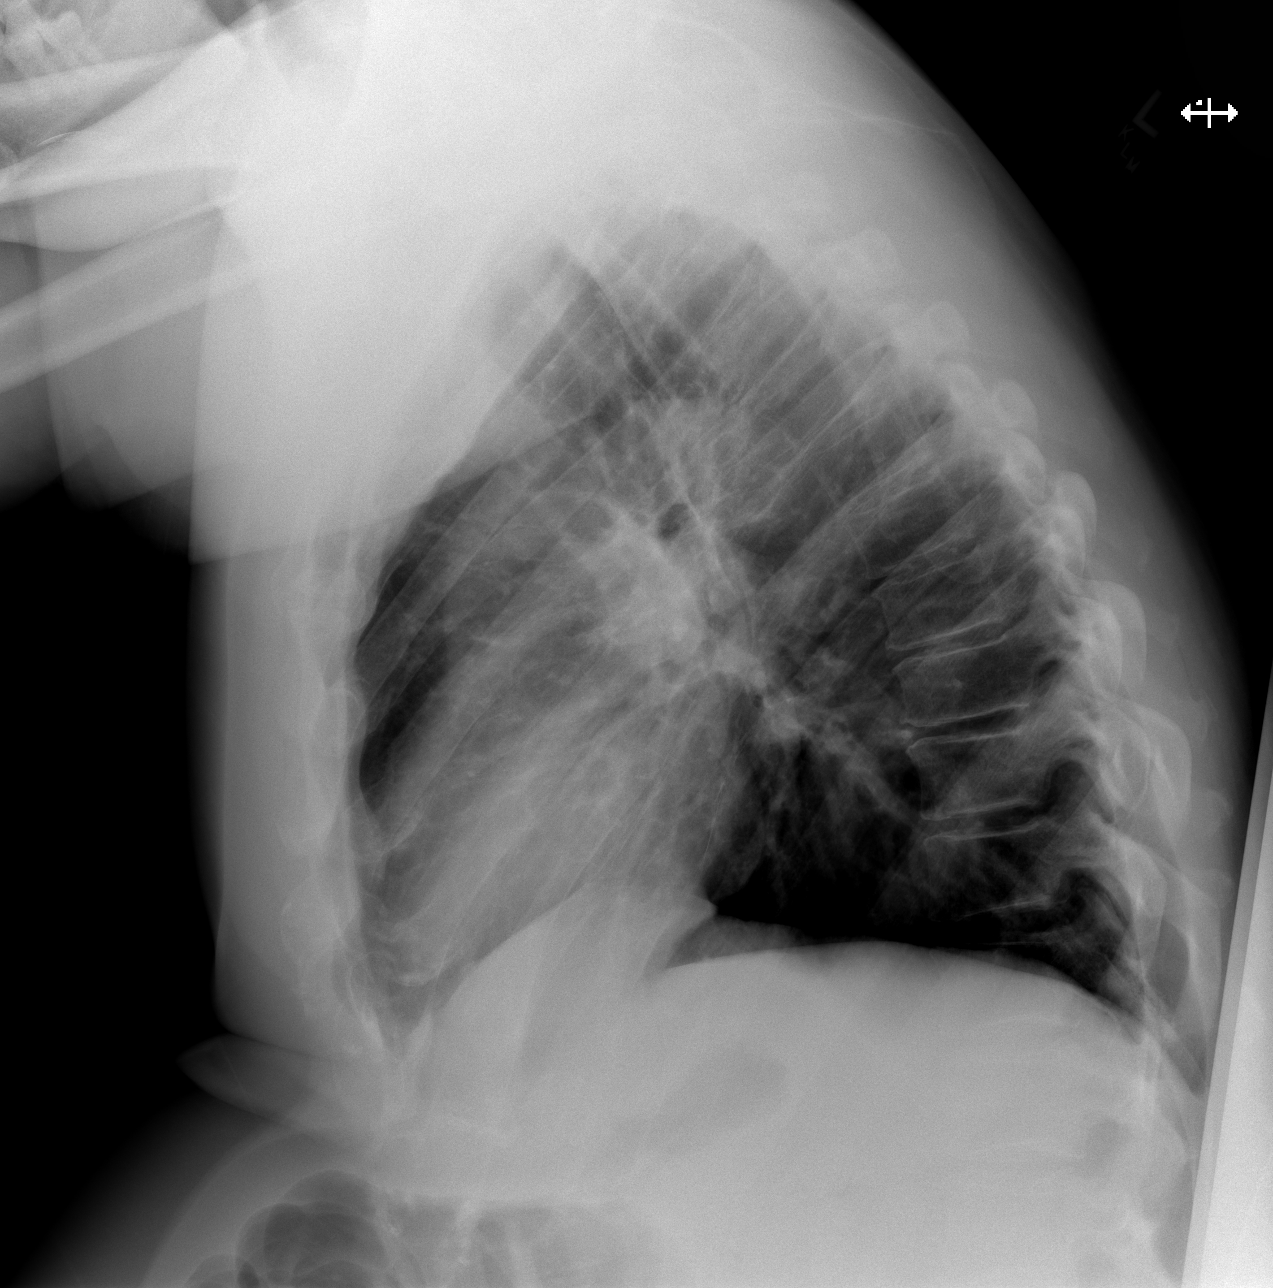

[3 of 3 positions shown; findings below may reference images not displayed]

FINDINGS: Heart size is normal. Mediastinal shadows are normal. No sign of
adenopathy. Chronic pulmonary scarring at the right lung base
laterally in the left mid lung as seen previously. No evidence of
active or progressive disease. No significant bone finding.
IMPRESSION: Chronic scarring at the right lung base and left mid lung. No sign
of adenopathy. No apparent change since [DATE].

## 2021-08-14 ENCOUNTER — Ambulatory Visit: Payer: Medicare PPO | Admitting: Internal Medicine

## 2021-08-14 ENCOUNTER — Encounter: Payer: Self-pay | Admitting: Internal Medicine

## 2021-08-14 VITALS — BP 112/70 | HR 93 | Temp 98.2°F | Ht 71.0 in

## 2021-08-14 DIAGNOSIS — E119 Type 2 diabetes mellitus without complications: Secondary | ICD-10-CM

## 2021-08-14 DIAGNOSIS — E538 Deficiency of other specified B group vitamins: Secondary | ICD-10-CM

## 2021-08-14 DIAGNOSIS — D75839 Thrombocytosis, unspecified: Secondary | ICD-10-CM | POA: Diagnosis not present

## 2021-08-14 DIAGNOSIS — Z23 Encounter for immunization: Secondary | ICD-10-CM

## 2021-08-14 LAB — IBC + FERRITIN
Ferritin: 183.3 ng/mL (ref 22.0–322.0)
Iron: 55 ug/dL (ref 42–165)
Saturation Ratios: 16.1 % — ABNORMAL LOW (ref 20.0–50.0)
TIBC: 341.6 ug/dL (ref 250.0–450.0)
Transferrin: 244 mg/dL (ref 212.0–360.0)

## 2021-08-14 LAB — CBC WITH DIFFERENTIAL/PLATELET
Basophils Absolute: 0 10*3/uL (ref 0.0–0.1)
Basophils Relative: 0.7 % (ref 0.0–3.0)
Eosinophils Absolute: 0.1 10*3/uL (ref 0.0–0.7)
Eosinophils Relative: 3.4 % (ref 0.0–5.0)
HCT: 36.5 % — ABNORMAL LOW (ref 39.0–52.0)
Hemoglobin: 11.7 g/dL — ABNORMAL LOW (ref 13.0–17.0)
Lymphocytes Relative: 23.6 % (ref 12.0–46.0)
Lymphs Abs: 0.8 10*3/uL (ref 0.7–4.0)
MCHC: 32.1 g/dL (ref 30.0–36.0)
MCV: 77.7 fl — ABNORMAL LOW (ref 78.0–100.0)
Monocytes Absolute: 0.5 10*3/uL (ref 0.1–1.0)
Monocytes Relative: 14.8 % — ABNORMAL HIGH (ref 3.0–12.0)
Neutro Abs: 2.1 10*3/uL (ref 1.4–7.7)
Neutrophils Relative %: 57.5 % (ref 43.0–77.0)
Platelets: 222 10*3/uL (ref 150.0–400.0)
RBC: 4.7 Mil/uL (ref 4.22–5.81)
RDW: 16.4 % — ABNORMAL HIGH (ref 11.5–15.5)
WBC: 3.6 10*3/uL — ABNORMAL LOW (ref 4.0–10.5)

## 2021-08-14 LAB — VITAMIN B12: Vitamin B-12: 410 pg/mL (ref 211–911)

## 2021-08-14 MED ORDER — SHINGRIX 50 MCG/0.5ML IM SUSR: 0.5000 mL | Freq: Once | INTRAMUSCULAR | 1 refills | Status: AC

## 2021-08-14 MED ORDER — GLUCOSE BLOOD VI STRP: 1.0000 | ORAL_STRIP | Freq: Two times a day (BID) | 5 refills | Status: AC

## 2021-08-14 MED ORDER — RYBELSUS 3 MG PO TABS: 3.0000 mg | ORAL_TABLET | Freq: Every day | ORAL | 0 refills | Status: DC

## 2021-08-14 MED ORDER — CONTOUR NEXT GEN MONITOR W/DEVICE KIT: 1.0000 | PACK | Freq: Two times a day (BID) | 0 refills | Status: AC

## 2021-08-14 NOTE — Patient Instructions (Signed)

## 2021-08-14 NOTE — Progress Notes (Signed)
Subjective:  Patient ID: Victor Christensen, male    DOB: September 17, 1970  Age: 51 y.o. MRN: [id number]  CC: Anemia and Diabetes   HPI Victor Christensen presents for f/up -  His recent A1c was 8.6%.  He denies polys.  He denies chest pain, shortness of breath, diaphoresis, or edema.  He is not currently monitoring his blood sugar.  Outpatient Medications Prior to Visit  Medication Sig Dispense Refill   alfuzosin (UROXATRAL) 10 MG 24 hr tablet Take 10 mg by mouth daily.     atorvastatin (LIPITOR) 10 MG tablet Take 1 tablet (10 mg total) by mouth daily. 90 tablet 1   celecoxib (CELEBREX) 200 MG capsule Take 1 capsule (200 mg total) by mouth daily. Take while stent in place 20 capsule 1   Cholecalciferol (VITAMIN D3 PO) Take 2,000 Units by mouth daily.     ciprofloxacin (CIPRO) 500 MG tablet Take 1 tablet (500 mg total) by mouth 2 (two) times daily. 6 tablet 0   folic acid (FOLVITE) 1 MG tablet Take 1 tablet (1 mg total) by mouth daily. 90 tablet 1   gabapentin (NEURONTIN) 300 MG capsule Take 4 capsules (1,200 mg total) by mouth 3 (three) times daily. Please call [phone] to schedule appt for continued refills. 360 capsule 11   metFORMIN (GLUCOPHAGE) 500 MG tablet Take 1 tablet (500 mg total) by mouth 3 (three) times daily with meals. 270 tablet 1   traMADol (ULTRAM) 50 MG tablet Take 1 tablet (50 mg total) by mouth every 6 (six) hours as needed for moderate pain or severe pain. 16 tablet 0   vitamin B-12 (CYANOCOBALAMIN) 1000 MCG tablet Take 1,000 mcg by mouth daily.     No facility-administered medications prior to visit.    ROS Review of Systems  Constitutional: Negative.  Negative for diaphoresis and fatigue.  HENT: Negative.    Eyes: Negative.   Respiratory:  Negative for cough, chest tightness, shortness of breath and wheezing.   Cardiovascular:  Negative for chest pain, palpitations and leg swelling.  Gastrointestinal:  Negative for abdominal pain, constipation, diarrhea, nausea and vomiting.   Endocrine: Negative.   Genitourinary: Negative.  Negative for difficulty urinating.  Musculoskeletal:  Positive for gait problem. Negative for arthralgias.  Skin: Negative.   Neurological:  Positive for weakness. Negative for dizziness and light-headedness.  Hematological:  Negative for adenopathy. Does not bruise/bleed easily.  Psychiatric/Behavioral: Negative.      Objective:  BP 112/70 (BP Location: Left Arm, Patient Position: Sitting, Cuff Size: Large)   Pulse 93   Temp 98.2 F (36.8 C) (Oral)   Ht 5' 11"  (1.803 m)   SpO2 97%   BMI 27.89 kg/m   BP Readings from Last 3 Encounters:  08/14/21 112/70  07/24/21 111/70  07/10/21 134/89    Wt Readings from Last 3 Encounters:  07/24/21 199 lb 15.3 oz (90.7 kg)  07/09/21 230 lb (104.3 kg)  04/05/21 213 lb (96.6 kg)    Physical Exam Vitals reviewed.  HENT:     Nose: Nose normal.     Mouth/Throat:     Mouth: Mucous membranes are moist.  Eyes:     General: No scleral icterus.    Conjunctiva/sclera: Conjunctivae normal.  Cardiovascular:     Rate and Rhythm: Normal rate.     Heart sounds: No murmur heard. Pulmonary:     Effort: Pulmonary effort is normal.     Breath sounds: No stridor. No wheezing, rhonchi or rales.  Abdominal:  General: Abdomen is flat.     Palpations: There is no mass.     Tenderness: There is no abdominal tenderness. There is no guarding.     Hernia: No hernia is present.  Musculoskeletal:        General: Normal range of motion.     Cervical back: Neck supple.     Right lower leg: No edema.     Left lower leg: No edema.  Lymphadenopathy:     Cervical: No cervical adenopathy.  Skin:    General: Skin is warm and dry.  Neurological:     Mental Status: He is alert. Mental status is at baseline.  Psychiatric:        Mood and Affect: Mood normal.        Behavior: Behavior normal.     Lab Results  Component Value Date   WBC 3.6 (L) 08/14/2021   HGB 11.7 (L) 08/14/2021   HCT 36.5 (L)  08/14/2021   PLT 222.0 08/14/2021   GLUCOSE 214 (H) 07/24/2021   CHOL 116 04/05/2021   TRIG 113.0 04/05/2021   HDL 37.20 (L) 04/05/2021   LDLCALC 56 04/05/2021   ALT 34 04/05/2021   AST 22 04/05/2021   NA 135 07/24/2021   K 4.8 07/24/2021   CL 101 07/24/2021   CREATININE 1.37 (H) 07/24/2021   BUN 21 (H) 07/24/2021   CO2 24 07/24/2021   TSH 1.29 04/05/2021   PSA 3.13 04/05/2021   HGBA1C 8.6 (H) 07/24/2021   MICROALBUR 5.6 (H) 04/05/2021    DG C-Arm 1-60 Min-No Report  Result Date: 07/24/2021 Fluoroscopy was utilized by the requesting physician.  No radiographic interpretation.   DG C-Arm 1-60 Min-No Report  Result Date: 07/24/2021 Fluoroscopy was utilized by the requesting physician.  No radiographic interpretation.    Assessment & Plan:   Victor Christensen was seen today for anemia and diabetes.  Diagnoses and all orders for this visit:  Vitamin B12 deficiency -     CBC with Differential/Platelet; Future -     Vitamin B12; Future -     Vitamin B12 -     CBC with Differential/Platelet  Type 2 diabetes mellitus without complication, without long-term current use of insulin (HCC)-we will add a GLP-1 agonist to the metformin. -     Semaglutide (RYBELSUS) 3 MG TABS; Take 3 mg by mouth daily. -     Amb Referral to Nutrition and Diabetic Education -     Blood Glucose Monitoring Suppl (CONTOUR NEXT GEN MONITOR) w/Device KIT; 1 Act by Does not apply route 2 (two) times daily. -     glucose blood test strip; 1 each by Other route 2 (two) times daily. Use as instructed  Thrombocytosis- His platelets are normal now.  He has a persistent anemia which is probably related to the anemia of chronic disease.  Will follow. -     IBC + Ferritin; Future -     CBC with Differential/Platelet; Future -     CBC with Differential/Platelet -     IBC + Ferritin  Need for prophylactic vaccination and inoculation against varicella -     Zoster Vaccine Adjuvanted [facility]) injection; Inject 0.5 mLs  into the muscle once for 1 dose. -     Zoster Vaccine Adjuvanted [facility]) injection; Inject 0.5 mLs into the muscle once for 1 dose.  Need for prophylactic vaccination with combined diphtheria-tetanus-pertussis (DTP) vaccine -     Tdap (BOOSTRIX) 5-2.5-18.5 LF-MCG/0.5 injection; Inject 0.5 mLs into the muscle  once for 1 dose.   I am having Victor Christensen start on Shingrix, Rybelsus, Contour Next Gen Monitor, glucose blood, Boostrix, and Shingrix. I am also having him maintain his Cholecalciferol (VITAMIN D3 PO), gabapentin, vitamin Z-66, folic acid, metFORMIN, atorvastatin, alfuzosin, celecoxib, traMADol, and ciprofloxacin.  Meds ordered this encounter  Medications   Zoster Vaccine Adjuvanted [facility]) injection    Sig: Inject 0.5 mLs into the muscle once for 1 dose.    Dispense:  0.5 mL    Refill:  1   Semaglutide (RYBELSUS) 3 MG TABS    Sig: Take 3 mg by mouth daily.    Dispense:  30 tablet    Refill:  0   Blood Glucose Monitoring Suppl (CONTOUR NEXT GEN MONITOR) w/Device KIT    Sig: 1 Act by Does not apply route 2 (two) times daily.    Dispense:  2 kit    Refill:  0   glucose blood test strip    Sig: 1 each by Other route 2 (two) times daily. Use as instructed    Dispense:  100 each    Refill:  5   Tdap (BOOSTRIX) 5-2.5-18.5 LF-MCG/0.5 injection    Sig: Inject 0.5 mLs into the muscle once for 1 dose.    Dispense:  0.5 mL    Refill:  0   Zoster Vaccine Adjuvanted [facility]) injection    Sig: Inject 0.5 mLs into the muscle once for 1 dose.    Dispense:  0.5 mL    Refill:  1     Follow-up: Return in about 6 months (around 02/14/2022).  [name], MD

## 2021-08-19 MED ORDER — SHINGRIX 50 MCG/0.5ML IM SUSR: 0.5000 mL | Freq: Once | INTRAMUSCULAR | 1 refills | Status: AC

## 2021-08-19 MED ORDER — BOOSTRIX 5-2.5-18.5 LF-MCG/0.5 IM SUSP: 0.5000 mL | Freq: Once | INTRAMUSCULAR | 0 refills | Status: AC

## 2021-08-23 ENCOUNTER — Other Ambulatory Visit: Payer: Self-pay

## 2021-08-23 DIAGNOSIS — G369 Acute disseminated demyelination, unspecified: Secondary | ICD-10-CM

## 2021-08-28 ENCOUNTER — Telehealth: Payer: Self-pay | Admitting: Neurology

## 2021-08-28 ENCOUNTER — Encounter: Payer: Self-pay | Admitting: Neurology

## 2021-08-28 ENCOUNTER — Ambulatory Visit: Payer: Medicare PPO | Admitting: Neurology

## 2021-08-28 VITALS — BP 104/70 | HR 100

## 2021-08-28 DIAGNOSIS — N200 Calculus of kidney: Secondary | ICD-10-CM | POA: Diagnosis not present

## 2021-08-28 DIAGNOSIS — R8271 Bacteriuria: Secondary | ICD-10-CM | POA: Diagnosis not present

## 2021-08-28 DIAGNOSIS — N139 Obstructive and reflux uropathy, unspecified: Secondary | ICD-10-CM | POA: Diagnosis not present

## 2021-08-28 DIAGNOSIS — N3000 Acute cystitis without hematuria: Secondary | ICD-10-CM | POA: Diagnosis not present

## 2021-08-28 DIAGNOSIS — G369 Acute disseminated demyelination, unspecified: Secondary | ICD-10-CM

## 2021-08-28 NOTE — Patient Instructions (Signed)
Closely monitor symptoms, let us know if anything worsens or anything new starts

## 2021-08-28 NOTE — Telephone Encounter (Signed)
I called Chloe back and advised I have the completed paper work. She requested the paper work be sent to # [phone], I have sent and confirmation has been received.

## 2021-08-28 NOTE — Telephone Encounter (Signed)
[facility] (Chloe) Checking on status of paperwork of order for powered wheelchair. Would like a call from the nurse.

## 2021-08-28 NOTE — Progress Notes (Signed)
Patient: Victor Christensen Date of Birth: 1970/09/23  Reason for Visit: Follow up History from: Patient, son  Primary Neurologist: Krista Blue   ASSESSMENT AND PLAN 51 y.o. year old male   1.  History of neuromyelitis since 2012, involving cervical and thoracic spinal cord -Seems to have remained relatively stable since last seen, no worsening since stopping CellCept in summer 2022, wishes to remain off, while closely monitoring symptoms -Order given for standing frame adult evolve glider -Follow-up in 6 months or sooner if needed  HISTORY  Victor Christensen is a 51 year old male, accompanied by his wife [name], seen in request by previous neurologist Dr. 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   I have reviewed and summarized the referring note from the referring physician.  He had a past medical history of diabetes since 2001, taking Metformin   In 2012, he presented with subacute onset of numbness upper and lower extremity, gait abnormality, incontinence, there was no visual loss   MRI of brain was negative, cervical spine with and without contrast showed contrast-enhancing cervical lesions, thoracic spine also showed contrast-enhancing upper cord edema,   CSF showed WBC of 215, protein of 258, 2 oligoclonal banding, IgG index was mildly elevated 0.7, serum NML antibody was negative, visual evoked potential was negative   He was diagnosed with neuromyelitis, was treated with rituximab 1000 mg IV on June 2012, August 07, 2010, January 2013, August 2013, February 2014, with treatment, he was able to regain some function, able to ambulate with walker, upper extremity strength mostly recovered.   While taking rituximab infusion, he had a few flareups, presented with worsening bilateral lower extremity weakness, sensory changes, eventually lost mobility in April 2014, become wheelchair-bound, he was seen at [facility] in 2014, was put on  CellCept 500 mg 2 tablets twice a day ever since 2014.   He had another bouts of flareup in 2016, presented with worsening lower extremity neuropathic pain, He was treated with IV Solu-Medrol in October 2016, followed by plasma exchange in October 2016, with significant improvement of his lower extremity neuropathic pain   He has been on stable dose of CellCept 500 mg 2 tablets twice a day since.   He spent most of the day in wheelchair, has fingertips paresthesia, mild hand grip weakness, but overall upper extremity function well, able to transfer himself in and out of wheelchair, driving with a modified car, keep a bowel regimen, wear depends.   UPDATE Mar 22 2019: Record from [facility] on October 22, 2014, MRI of cervical spine with and without contrast, there is patchy signal abnormality, extends from the cervical medullary junction eccentric towards the left, along the central and dorsal aspect of the cord C2, 3, right dorsal cord C6, and patchy signal abnormality at C7, upper thoracic cord, on the sagittal STIR imagings or thoracic spinal cord, there are diffuse patchy signal abnormality in the cord, and mild diffuse volume loss, there is irregular linear band of enhancement cervical spinal cord extending from posterior cervical medullary junction to the C6 level, also at T1-T2, T2-T3 level finding appear to progress since most recent MRI cervical spine in 2015, but similar to more remote cervical spine imaging in 2014, no additional enhancing abnormality identified in the remainder of the thoracic spinal cord,Tiny disc protrusion at T3-T4,   I personally reviewed MRI of spines with without contrast in September 2020 There is generalized atrophy of cervical and upper thoracic spine,  with patchy signal changes, correlate with remote insult in this patient, Cord: Diffusely small cord measuring only 3-4 mm anterior to posterior at the cervicothoracic junction. Cord also has heterogeneous  signal on T2 and STIR imaging, attributed to gliosis from prior demyelination. No expansion/swelling and no abnormal enhancement.   MRI of the brain was normal   Laboratory evaluation in August 2020 showed negative NMO  antibody, mild elevated A1c 7.1, CMP showed mild elevated glucose 122, creatinine of 1.21, normal or negative ANA, HIV, RPR, copper, ferritin, vitamin D level was 13.7, decreased WBC of 2.9, ESR was mildly elevated 26, normal CPK, TSH, C-reactive protein, B12,   UPDATE July 19 2019: He was noted to have decreased WBC 2.8 in laboratory evaluation, CellCept was stopped at the end of March 2021, was referred to hematologist,   I was able to review his hematology evaluation by Dr. Lindi Adie on May 17, 2019, neutropenia, WBC of 2.8, ANC of 1.2, noticed neutropenia since January 2019, repeat laboratory evaluation showed WBC of 3, ANC was 1.5, B12 was 181, negative ANA, folic acid 6.1, LDH of 150   Based on these laboratory evaluations, B12 deficiency is likely account for his neutropenia and, hematologist Dr.  Lindi Adie  have suggested resume CellCept if it is necessary   I also talked with his dermatologist Dr. 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, suspicious for cutaneous sarcoidosis, low likelihood of infectious disease, per patient, chest x-ray was negative, culture was negative, he was given steroid cream, which has helped his symptoms   Since CellCept was stopped in March 2021, most recent CBC was on May 17, 2019, WBC was 3.0,   UPDATE July 5th 2022: He is accompanied by his wife at today's visit, complains of increased right shoulder pain since beginning of 2022, he also noticed significant tenderness at upper biceps region, he cannot clearly identify a trigger event, but he does like to throw  ball using his right hand, he workout his upper extremity regularly, rely on his shoulder and arm to transfer in and  out of wheelchair, over the past 6 months, his right shoulder and arm discomfort consistent, he noticed more than right hand numbness tingling, mild right-sided neck pain,   He was given prednisone tapering dose for 1 week, it does help him some  He is now on CellCept 500 mg 1 tablet twice a day, previously when he was on higher dose 2 tablets twice a day, there was noticeable leukopenia, CellCept was on hold, he was seen by hematologist, no etiology was found, it was okay per hematology to restart CellCept, which she is now taking 500 mg twice daily since June 2021, he denies significant ill effect   UPDATE Feb 01 2021: He has stopped taking CellCept given low-dose 500 mg twice a day since last visit in July 2022, no flareup of his monitor his symptoms, right shoulder was diagnosed with right rotator cuff, improved with orthopedic care including intra-articular injection  He is overall doing very well, since stopped taking CellCept, he no longer have body achy pain, not taking gabapentin anymore,  Update 08/28/21 SS: here with his son, off Cellcept for 1 year, no worsening symptoms. Uses electric wheelchair, non-ambulatory, has control of B/B. Asking for easy to stand glider, he wants to be upright. He drives a car, uses hand controls. No achy pain. Dealing with kidney stones. Pain to right shoulder, told bursitis, given injection. Doing a lot  of travel. Blood work 08/14/21 B12 normal 410, HGB 11.7, Ferritin normal 183.  REVIEW OF SYSTEMS: Out of a complete 14 system review of symptoms, the patient complains only of the following symptoms, and all other reviewed systems are negative.  See HPI  ALLERGIES: Allergies  Allergen Reactions   Duloxetine Diarrhea and Other (See Comments)    insomnia   Oxycodone-Acetaminophen Other (See Comments)    Makes him loopy     HOME MEDICATIONS: Outpatient Medications Prior to Visit  Medication Sig Dispense Refill   alfuzosin (UROXATRAL) 10 MG 24 hr tablet  Take 10 mg by mouth daily.     atorvastatin (LIPITOR) 10 MG tablet Take 1 tablet (10 mg total) by mouth daily. 90 tablet 1   Blood Glucose Monitoring Suppl (CONTOUR NEXT GEN MONITOR) w/Device KIT 1 Act by Does not apply route 2 (two) times daily. 2 kit 0   Cholecalciferol (VITAMIN D3 PO) Take 2,000 Units by mouth daily.     folic acid (FOLVITE) 1 MG tablet Take 1 tablet (1 mg total) by mouth daily. 90 tablet 1   glucose blood test strip 1 each by Other route 2 (two) times daily. Use as instructed 100 each 5   metFORMIN (GLUCOPHAGE) 500 MG tablet Take 1 tablet (500 mg total) by mouth 3 (three) times daily with meals. 270 tablet 1   Semaglutide (RYBELSUS) 3 MG TABS Take 3 mg by mouth daily. 30 tablet 0   vitamin B-12 (CYANOCOBALAMIN) 1000 MCG tablet Take 1,000 mcg by mouth daily.     celecoxib (CELEBREX) 200 MG capsule Take 1 capsule (200 mg total) by mouth daily. Take while stent in place 20 capsule 1   gabapentin (NEURONTIN) 300 MG capsule Take 4 capsules (1,200 mg total) by mouth 3 (three) times daily. Please call [phone] to schedule appt for continued refills. 360 capsule 11   traMADol (ULTRAM) 50 MG tablet Take 1 tablet (50 mg total) by mouth every 6 (six) hours as needed for moderate pain or severe pain. 16 tablet 0   No facility-administered medications prior to visit.    PAST MEDICAL HISTORY: Past Medical History:  Diagnosis Date   Anxiety    Diabetes mellitus without complication ([place])    type 2    History of kidney stones    Neuromyelitis optica ([place])    Paraplegia ([place]) 2014   per Patient    PAST SURGICAL HISTORY: Past Surgical History:  Procedure Laterality Date   CYSTOSCOPY W/ URETERAL STENT PLACEMENT Left 07/09/2021   Procedure: CYSTOSCOPY WITH RETROGRADE PYELOGRAM/URETERAL STENT PLACEMENT;  Surgeon: Vira Agar, MD;  Location: [facility];  Service: Urology;  Laterality: Left;   CYSTOSCOPY WITH STENT PLACEMENT Left 01/15/2018   Procedure: CYSTOSCOPY WITH STENT PLACEMENT  retrograde pylegram;  Surgeon: Franchot Gallo, MD;  Location: [facility];  Service: Urology;  Laterality: Left;   CYSTOSCOPY/URETEROSCOPY/HOLMIUM LASER/STENT PLACEMENT Left 02/05/2018   Procedure: CYSTOSCOPY/URETEROSCOPY/HOLMIUM LASER/STENT EXTRACTION WITH LEFT STENT PLACEMENT;  Surgeon: Franchot Gallo, MD;  Location: [facility];  Service: Urology;  Laterality: Left;   CYSTOSCOPY/URETEROSCOPY/HOLMIUM LASER/STENT PLACEMENT Left 07/24/2021   Procedure: LEFT URETEROSCOPY/HOLMIUM LASER/STENT PLACEMENT;  Surgeon: Vira Agar, MD;  Location: [facility];  Service: Urology;  Laterality: Left;  18 MINUTES    FAMILY HISTORY: Family History  Problem Relation Age of Onset   Hypertension Mother    Hypertension Father    Diabetes Father     SOCIAL HISTORY: Social History   Socioeconomic History   Marital status: Married  Spouse name: Not on file   Number of children: 2   Years of education: college   Highest education level: Bachelor's degree (e.g., BA, AB, BS)  Occupational History   Occupation: Disabled  Tobacco Use   Smoking status: Never   Smokeless tobacco: Never  Vaping Use   Vaping Use: Never used  Substance and Sexual Activity   Alcohol use: Not Currently    Comment: social   Drug use: Never   Sexual activity: Not Currently  Other Topics Concern   Not on file  Social History Narrative   Lives at home with his wife and son.   Right-handed.   Moderate caffeine use.   Social Determinants of Health   Financial Resource Strain: Low Risk  (10/09/2018)   Overall Financial Resource Strain (CARDIA)    Difficulty of Paying Living Expenses: Not hard at all  Food Insecurity: No Food Insecurity (10/09/2018)   Hunger Vital Sign    Worried About Running Out of Food in the Last Year: Never true    Ran Out of Food in the Last Year: Never true  Transportation Needs: No Transportation Needs (10/09/2018)   PRAPARE - Hydrologist (Medical): No    Lack of  Transportation (Non-Medical): No  Physical Activity: Insufficiently Active (10/09/2018)   Exercise Vital Sign    Days of Exercise per Week: 3 days    Minutes of Exercise per Session: 30 min  Stress: Stress Concern Present (10/09/2018)   [place]    Feeling of Stress : To some extent  Social Connections: Unknown (10/09/2018)   Social Connection and Isolation Panel [NHANES]    Frequency of Communication with Friends and Family: Three times a week    Frequency of Social Gatherings with Friends and Family: Twice a week    Attends Religious Services: Patient refused    Active Member of Clubs or Organizations: Patient refused    Attends Archivist Meetings: Patient refused    Marital Status: Married  Human resources officer Violence: Not At Risk (10/09/2018)   Humiliation, Afraid, Rape, and Kick questionnaire    Fear of Current or Ex-Partner: No    Emotionally Abused: No    Physically Abused: No    Sexually Abused: No    PHYSICAL EXAM  Vitals:   08/28/21 1437  BP: 104/70  Pulse: 100   There is no height or weight on file to calculate BMI.  Generalized: Well developed, in no acute distress  Neurological examination  Mentation: Alert oriented to time, place, history taking. Follows all commands speech and language fluent Cranial nerve II-XII: Pupils were equal round reactive to light. Extraocular movements were full, visual field were full on confrontational test. Facial sensation and strength were normal. Head turning and shoulder shrug were normal and symmetric. Motor: Good strength of upper extremities, 4/5, slightly weaker in the right than the left, he has no voluntary movement of lower extremities with mild to moderate increased tone Sensory: Decreased soft touch sensation to bilateral lower extremities Coordination: Finger-nose-finger is normal bilaterally, cannot perform heel-to-shin Gait and station: In  electric scooter, is nonambulatory Reflexes: Deep tendon reflexes are symmetric but are decreased  DIAGNOSTIC DATA (LABS, IMAGING, TESTING) - I reviewed patient records, labs, notes, testing and imaging myself where available.  Lab Results  Component Value Date   WBC 3.6 (L) 08/14/2021   HGB 11.7 (L) 08/14/2021   HCT 36.5 (L) 08/14/2021   MCV 77.7 (  L) 08/14/2021   PLT 222.0 08/14/2021      Component Value Date/Time   NA 135 07/24/2021 0633   NA 140 08/01/2020 1551   K 4.8 07/24/2021 0633   CL 101 07/24/2021 0633   CO2 24 07/24/2021 0633   GLUCOSE 214 (H) 07/24/2021 0633   BUN 21 (H) 07/24/2021 0633   BUN 13 08/01/2020 1551   CREATININE 1.37 (H) 07/24/2021 0633   CALCIUM 9.0 07/24/2021 0633   PROT 7.5 04/05/2021 1131   PROT 7.1 08/01/2020 1551   ALBUMIN 4.5 04/05/2021 1131   ALBUMIN 4.5 08/01/2020 1551   AST 22 04/05/2021 1131   ALT 34 04/05/2021 1131   ALKPHOS 41 04/05/2021 1131   BILITOT 0.6 04/05/2021 1131   BILITOT 0.3 08/01/2020 1551   GFRNONAA >60 07/24/2021 0633   GFRAA 92 10/06/2019 1430   Lab Results  Component Value Date   CHOL 116 04/05/2021   HDL 37.20 (L) 04/05/2021   LDLCALC 56 04/05/2021   TRIG 113.0 04/05/2021   CHOLHDL 3 04/05/2021   Lab Results  Component Value Date   HGBA1C 8.6 (H) 07/24/2021   Lab Results  Component Value Date   VITAMINB12 410 08/14/2021   Lab Results  Component Value Date   TSH 1.29 04/05/2021    Butler Denmark, AGNP-C, DNP 08/28/2021, 3:55 PM [facility] [address], [place] [address] [place], [place] [address] [phone]

## 2021-08-28 NOTE — Telephone Encounter (Signed)
Chole called me back and sts the information was not correct and sent additional information for our office to complete. I have completed the pw and faxed back. Maralyn Sago was the most recent office visit and I attached this information and had sarah sign the information since Dr. Terrace Arabia is out of the office.  Hopefully this will be accepted.

## 2021-08-29 ENCOUNTER — Other Ambulatory Visit: Payer: Self-pay | Admitting: Urology

## 2021-08-29 NOTE — Telephone Encounter (Signed)
I returned the call and spoke with Victor Christensen. She sts the pw signed by Maralyn Sago could not be accepted. New standard order sheet with [name]'s information should be coming to the office and NP will have to sign. Will be on the look out.

## 2021-08-29 NOTE — Telephone Encounter (Signed)
Chole called for Victor Christensen and is requesting a call back.

## 2021-08-30 NOTE — Telephone Encounter (Addendum)
Paperwork received from [employer] (ph: [phone], fax: [phone]). Signed by Maralyn Sago and faxed back. Confirmation received.

## 2021-09-05 ENCOUNTER — Other Ambulatory Visit: Payer: Self-pay | Admitting: Urology

## 2021-09-06 ENCOUNTER — Ambulatory Visit: Payer: Medicare PPO | Admitting: Neurology

## 2021-09-06 NOTE — Progress Notes (Signed)
Pre-op phone call complete. Procedure date and time confirmed. Allergies, medications, and medical history verified. Patient denies any recent history of chest pain. Patient stated that he has had a positive covid test in the last 90 days, but is now asymptomatic. Patient advised not to take diabetes medication the day of procedure and to stop vitamins. Driver secured.

## 2021-09-10 ENCOUNTER — Ambulatory Visit (HOSPITAL_BASED_OUTPATIENT_CLINIC_OR_DEPARTMENT_OTHER)
Admission: RE | Admit: 2021-09-10 | Discharge: 2021-09-10 | Disposition: A | Discharge status: Home / Self Care | Payer: Medicare PPO | Attending: Urology | Admitting: Urology

## 2021-09-10 ENCOUNTER — Ambulatory Visit (HOSPITAL_COMMUNITY): Payer: Medicare PPO

## 2021-09-10 ENCOUNTER — Encounter (HOSPITAL_BASED_OUTPATIENT_CLINIC_OR_DEPARTMENT_OTHER)
Admission: RE | Disposition: A | Discharge status: Home / Self Care | Payer: Self-pay | Source: Home / Self Care | Attending: Urology

## 2021-09-10 ENCOUNTER — Encounter (HOSPITAL_BASED_OUTPATIENT_CLINIC_OR_DEPARTMENT_OTHER): Payer: Self-pay | Admitting: Urology

## 2021-09-10 ENCOUNTER — Other Ambulatory Visit: Payer: Self-pay

## 2021-09-10 DIAGNOSIS — I878 Other specified disorders of veins: Secondary | ICD-10-CM | POA: Diagnosis not present

## 2021-09-10 DIAGNOSIS — E119 Type 2 diabetes mellitus without complications: Secondary | ICD-10-CM | POA: Insufficient documentation

## 2021-09-10 DIAGNOSIS — N200 Calculus of kidney: Secondary | ICD-10-CM | POA: Diagnosis not present

## 2021-09-10 DIAGNOSIS — Z01818 Encounter for other preprocedural examination: Secondary | ICD-10-CM | POA: Diagnosis not present

## 2021-09-10 HISTORY — PX: EXTRACORPOREAL SHOCK WAVE LITHOTRIPSY: SHX1557

## 2021-09-10 LAB — GLUCOSE, CAPILLARY: Glucose-Capillary: 114 mg/dL — ABNORMAL HIGH (ref 70–99)

## 2021-09-10 SURGERY — LITHOTRIPSY, ESWL
Anesthesia: LOCAL | Laterality: Right

## 2021-09-10 MED ORDER — CIPROFLOXACIN HCL 500 MG PO TABS
500.0000 mg | ORAL_TABLET | ORAL | Status: AC
  Administered 2021-09-10: 500 mg via ORAL

## 2021-09-10 MED ORDER — DIPHENHYDRAMINE HCL 25 MG PO CAPS
25.0000 mg | ORAL_CAPSULE | ORAL | Status: AC
  Administered 2021-09-10: 25 mg via ORAL

## 2021-09-10 MED ORDER — DIPHENHYDRAMINE HCL 25 MG PO CAPS
ORAL_CAPSULE | ORAL | Status: AC
  Filled 2021-09-10: qty 1

## 2021-09-10 MED ORDER — TRAMADOL HCL 50 MG PO TABS: 50.0000 mg | ORAL_TABLET | Freq: Four times a day (QID) | ORAL | 0 refills | Status: AC | PRN

## 2021-09-10 MED ORDER — DIAZEPAM 5 MG PO TABS
10.0000 mg | ORAL_TABLET | ORAL | Status: AC
  Administered 2021-09-10: 10 mg via ORAL

## 2021-09-10 MED ORDER — ONDANSETRON 8 MG PO TBDP: 8.0000 mg | ORAL_TABLET | Freq: Three times a day (TID) | ORAL | 0 refills | Status: DC | PRN

## 2021-09-10 MED ORDER — DIAZEPAM 5 MG PO TABS
ORAL_TABLET | ORAL | Status: AC
  Filled 2021-09-10: qty 2

## 2021-09-10 MED ORDER — CIPROFLOXACIN HCL 500 MG PO TABS
ORAL_TABLET | ORAL | Status: AC
  Filled 2021-09-10: qty 1

## 2021-09-10 MED ORDER — SODIUM CHLORIDE 0.9 % IV SOLN: INTRAVENOUS | Status: DC

## 2021-09-10 NOTE — Discharge Instructions (Addendum)
1 - You may have urinary urgency (bladder spasms), pass small stone fragments, and bloody urine on / off for up to 2 weeks. This is normal.  2 - Call MD or go to ER for fever >102, severe pain / nausea / vomiting not relieved by medications, or acute change in medical status     Post Anesthesia Home Care Instructions  Activity: Get plenty of rest for the remainder of the day. A responsible individual must stay with you for 24 hours following the procedure.  For the next 24 hours, DO NOT: -Drive a car -Operate machinery -Drink alcoholic beverages -Take any medication unless instructed by your physician -Make any legal decisions or sign important papers.  Meals: Start with liquid foods such as gelatin or soup. Progress to regular foods as tolerated. Avoid greasy, spicy, heavy foods. If nausea and/or vomiting occur, drink only clear liquids until the nausea and/or vomiting subsides. Call your physician if vomiting continues.  Special Instructions/Symptoms: Your throat may feel dry or sore from the anesthesia or the breathing tube placed in your throat during surgery. If this causes discomfort, gargle with warm salt water. The discomfort should disappear within 24 hours.     

## 2021-09-10 NOTE — Brief Op Note (Signed)
09/10/2021  9:31 AM  PATIENT:  [name]  51 y.o. male  PRE-OPERATIVE DIAGNOSIS:  RENAL STONE  POST-OPERATIVE DIAGNOSIS:  * No post-op diagnosis entered *  PROCEDURE:  Procedure(s): EXTRACORPOREAL SHOCK WAVE LITHOTRIPSY (ESWL) (Right)  SURGEON:  Surgeon(s) and Role:    * Alexis Frock, MD - Primary  PHYSICIAN ASSISTANT:   ASSISTANTS: none   ANESTHESIA:   MAC  EBL:  minimal   BLOOD ADMINISTERED:none  DRAINS: none   LOCAL MEDICATIONS USED:  NONE  SPECIMEN:  No Specimen  DISPOSITION OF SPECIMEN:  N/A  COUNTS:  YES  TOURNIQUET:  * No tourniquets in log *  DICTATION: .Note written in paper chart  PLAN OF CARE: Discharge to home after PACU  PATIENT DISPOSITION:  Short Stay   Delay start of Pharmacological VTE agent (>24hrs) due to surgical blood loss or risk of bleeding: not applicable

## 2021-09-10 NOTE — H&P (Signed)
Victor Christensen is an 51 y.o. male.    Chief Complaint: Pre-OP RIGHT Shockwave Lithotripsy  HPI:   1 - RIGHT Renal Stone - 66mm RLP non-obstructing renal stone on KUB and renal US 08/2021. He does have some intermitant right flank pain which may or may not be from this.  Today "Victor Christensen" is seen to proceed with RIGHT shockwave lithotripsy for Rt renal stone. Most recetn UCX with some staph bacteruria (?contaminant) that was treated with doxy course. No interval fevers. Most recent A1c 8s.   Past Medical History:  Diagnosis Date   Anxiety    Diabetes mellitus without complication ([facility])    type 2    History of kidney stones    Neuromyelitis optica ([facility])    Paraplegia ([facility]) 2014   per Patient    Past Surgical History:  Procedure Laterality Date   CYSTOSCOPY W/ URETERAL STENT PLACEMENT Left 07/09/2021   Procedure: CYSTOSCOPY WITH RETROGRADE PYELOGRAM/URETERAL STENT PLACEMENT;  Surgeon: Despina Arias, MD;  Location: [facility];  Service: Urology;  Laterality: Left;   CYSTOSCOPY WITH STENT PLACEMENT Left 01/15/2018   Procedure: CYSTOSCOPY WITH STENT PLACEMENT retrograde pylegram;  Surgeon: Marcine Matar, MD;  Location: [facility];  Service: Urology;  Laterality: Left;   CYSTOSCOPY/URETEROSCOPY/HOLMIUM LASER/STENT PLACEMENT Left 02/05/2018   Procedure: CYSTOSCOPY/URETEROSCOPY/HOLMIUM LASER/STENT EXTRACTION WITH LEFT STENT PLACEMENT;  Surgeon: Marcine Matar, MD;  Location: [facility];  Service: Urology;  Laterality: Left;   CYSTOSCOPY/URETEROSCOPY/HOLMIUM LASER/STENT PLACEMENT Left 07/24/2021   Procedure: LEFT URETEROSCOPY/HOLMIUM LASER/STENT PLACEMENT;  Surgeon: Despina Arias, MD;  Location: [facility];  Service: Urology;  Laterality: Left;  90 MINUTES    Family History  Problem Relation Age of Onset   Hypertension Mother    Hypertension Father    Diabetes Father    Social History:  reports that he has never smoked. He has never used smokeless tobacco. He reports that he does not currently use  alcohol. He reports that he does not use drugs.  Allergies:  Allergies  Allergen Reactions   Duloxetine Diarrhea and Other (See Comments)    insomnia   Oxycodone-Acetaminophen Other (See Comments)    Makes him loopy     No medications prior to admission.    No results found for this or any previous visit (from the past 48 hour(s)). No results found.  Review of Systems  Constitutional:  Negative for chills and fever.  All other systems reviewed and are negative.   There were no vitals taken for this visit. Physical Exam Vitals reviewed.  HENT:     Mouth/Throat:     Mouth: Mucous membranes are moist.  Cardiovascular:     Rate and Rhythm: Normal rate.  Abdominal:     General: Abdomen is flat.     Comments: Moderate truncal obesity  Genitourinary:    Comments: No CVAT at present Musculoskeletal:     Cervical back: Normal range of motion.     Comments: LE wasting c/w partial paralysis. No skin breakdown.   Neurological:     General: No focal deficit present.     Mental Status: He is alert.  Psychiatric:        Mood and Affect: Mood normal.      Assessment/Plan  Proceed as planned with RIGHT shockwave lithotripsy. Risks, benefits, alternatives, expected peri-treatmetn course discussed. Reiterated importance of glycemic control as that can significantly increase his risk of infections, vascular and neurologic disease.   Sebastian Ache, MD 09/10/2021, 6:42 AM

## 2021-09-11 ENCOUNTER — Encounter (HOSPITAL_BASED_OUTPATIENT_CLINIC_OR_DEPARTMENT_OTHER): Payer: Self-pay | Admitting: Urology

## 2021-09-12 ENCOUNTER — Ambulatory Visit: Payer: Medicare PPO | Admitting: Internal Medicine

## 2021-09-12 ENCOUNTER — Encounter: Payer: Self-pay | Admitting: Internal Medicine

## 2021-09-12 VITALS — BP 106/64 | HR 100 | Temp 98.1°F | Ht 71.0 in

## 2021-09-12 DIAGNOSIS — E538 Deficiency of other specified B group vitamins: Secondary | ICD-10-CM | POA: Diagnosis not present

## 2021-09-12 DIAGNOSIS — L89151 Pressure ulcer of sacral region, stage 1: Secondary | ICD-10-CM

## 2021-09-12 NOTE — Progress Notes (Signed)
Subjective:  Patient ID: Victor Christensen, male    DOB: 05-09-1970  Age: 51 y.o. MRN: [id number]  CC: Skin Ulcer   HPI Victor Christensen presents for f/up -  He complains of a one month hx of sacral skin ulcer - it rarely bleeds but otherwise does not bother him. He has been treating it with peroxide.  Outpatient Medications Prior to Visit  Medication Sig Dispense Refill   atorvastatin (LIPITOR) 10 MG tablet Take 1 tablet (10 mg total) by mouth daily. 90 tablet 1   Blood Glucose Monitoring Suppl (CONTOUR NEXT GEN MONITOR) w/Device KIT 1 Act by Does not apply route 2 (two) times daily. 2 kit 0   Cholecalciferol (VITAMIN D3 PO) Take 2,000 Units by mouth daily.     folic acid (FOLVITE) 1 MG tablet Take 1 tablet (1 mg total) by mouth daily. 90 tablet 1   glucose blood test strip 1 each by Other route 2 (two) times daily. Use as instructed 100 each 5   metFORMIN (GLUCOPHAGE) 500 MG tablet Take 1 tablet (500 mg total) by mouth 3 (three) times daily with meals. 270 tablet 1   ondansetron (ZOFRAN-ODT) 8 MG disintegrating tablet Take 1 tablet (8 mg total) by mouth every 8 (eight) hours as needed for nausea or vomiting. After lithotripsy 10 tablet 0   tamsulosin (FLOMAX) 0.4 MG CAPS capsule Take 0.4 mg by mouth at bedtime.     traMADol (ULTRAM) 50 MG tablet Take 1-2 tablets (50-100 mg total) by mouth every 6 (six) hours as needed for moderate pain or severe pain (after lithotripsy). 20 tablet 0   vitamin B-12 (CYANOCOBALAMIN) 1000 MCG tablet Take 1,000 mcg by mouth daily.     No facility-administered medications prior to visit.    ROS Review of Systems  Constitutional:  Negative for chills and fatigue.  HENT: Negative.    Eyes: Negative.   Respiratory:  Negative for cough and shortness of breath.   Cardiovascular:  Negative for chest pain, palpitations and leg swelling.  Gastrointestinal:  Negative for abdominal pain.  Endocrine: Negative.   Genitourinary: Negative.   Musculoskeletal: Negative.    Skin: Negative.   Allergic/Immunologic: Negative.   Neurological: Negative.   Hematological: Negative.     Objective:  BP 106/64 (BP Location: Left Arm, Patient Position: Sitting, Cuff Size: Large)   Pulse 100   Temp 98.1 F (36.7 C) (Oral)   Ht 5' 11"  (1.803 m)   SpO2 97%   BMI 27.89 kg/m   BP Readings from Last 3 Encounters:  09/12/21 106/64  09/10/21 106/80  08/28/21 104/70    Wt Readings from Last 3 Encounters:  09/10/21 200 lb (90.7 kg)  07/24/21 199 lb 15.3 oz (90.7 kg)  07/09/21 230 lb (104.3 kg)    Physical Exam Vitals reviewed.  HENT:     Mouth/Throat:     Mouth: Mucous membranes are moist.  Eyes:     General: No scleral icterus.    Conjunctiva/sclera: Conjunctivae normal.  Cardiovascular:     Rate and Rhythm: Normal rate and regular rhythm.  Pulmonary:     Breath sounds: No wheezing, rhonchi or rales.  Abdominal:     General: Abdomen is flat.     Palpations: There is no mass.     Tenderness: There is no abdominal tenderness. There is no guarding.     Hernia: No hernia is present.  Musculoskeletal:        General: Normal range of motion.  Cervical back: Neck supple.     Right lower leg: No edema.     Left lower leg: No edema.  Lymphadenopathy:     Cervical: No cervical adenopathy.  Skin:    Findings: Lesion present.     Comments: Sacral ulcer.  Granulated.  No exudate.  Nontender.  No odor.  No induration, fluctuance, or streaking.  Neurological:     General: No focal deficit present.     Lab Results  Component Value Date   WBC 3.6 (L) 08/14/2021   HGB 11.7 (L) 08/14/2021   HCT 36.5 (L) 08/14/2021   PLT 222.0 08/14/2021   GLUCOSE 214 (H) 07/24/2021   CHOL 116 04/05/2021   TRIG 113.0 04/05/2021   HDL 37.20 (L) 04/05/2021   LDLCALC 56 04/05/2021   ALT 34 04/05/2021   AST 22 04/05/2021   NA 135 07/24/2021   K 4.8 07/24/2021   CL 101 07/24/2021   CREATININE 1.37 (H) 07/24/2021   BUN 21 (H) 07/24/2021   CO2 24 07/24/2021   TSH  1.29 04/05/2021   PSA 3.13 04/05/2021   HGBA1C 8.6 (H) 07/24/2021   MICROALBUR 5.6 (H) 04/05/2021    DG Abd 1 View  Result Date: 09/10/2021 CLINICAL DATA:  Preop for lithotripsy of RIGHT-sided stone. EXAM: ABDOMEN - 1 VIEW COMPARISON:  Plain film of the abdomen dated 08/28/2021. FINDINGS: 4 mm stone projecting over the lower pole of the RIGHT kidney. No LEFT-sided renal stone identified. No evidence of ureteral stone seen. Vascular phleboliths within the LEFT lower pelvis. Visualized bowel gas pattern is nonobstructive. No evidence of soft tissue mass or abnormal fluid collection. No acute-appearing osseous abnormality seen. IMPRESSION: 4 mm stone projecting over the lower pole of the RIGHT kidney. Electronically Signed   By: Franki Cabot M.D.   On: 09/10/2021 07:53    Assessment & Plan:   Victor Christensen was seen today for skin ulcer.  Diagnoses and all orders for this visit:  Vitamin B12 deficiency- Will continue parenteral B12 replacement tx.  Decubitus ulcer of sacral region, stage 1 -     Ambulatory referral to [facility]   I am having Victor Christensen maintain his Cholecalciferol (VITAMIN D3 PO), cyanocobalamin, folic acid, metFORMIN, atorvastatin, Contour Next Gen Monitor, glucose blood, tamsulosin, traMADol, and ondansetron.  No orders of the defined types were placed in this encounter.    Follow-up: No follow-ups on file.  Victor Calico, MD

## 2021-09-14 ENCOUNTER — Telehealth: Payer: Self-pay | Admitting: Internal Medicine

## 2021-09-14 NOTE — Telephone Encounter (Signed)
Lele, a home health nurse called to let us know they started services today and to let us know that the sacral ulcer is at a level 3.  For any questions please call:   Lele: [phone]

## 2021-09-19 DIAGNOSIS — N139 Obstructive and reflux uropathy, unspecified: Secondary | ICD-10-CM | POA: Diagnosis not present

## 2021-09-19 DIAGNOSIS — N200 Calculus of kidney: Secondary | ICD-10-CM | POA: Diagnosis not present

## 2021-09-24 DIAGNOSIS — M4716 Other spondylosis with myelopathy, lumbar region: Secondary | ICD-10-CM | POA: Diagnosis not present

## 2021-09-24 DIAGNOSIS — Z993 Dependence on wheelchair: Secondary | ICD-10-CM | POA: Diagnosis not present

## 2021-09-24 DIAGNOSIS — G369 Acute disseminated demyelination, unspecified: Secondary | ICD-10-CM | POA: Diagnosis not present

## 2021-10-02 DIAGNOSIS — Z7984 Long term (current) use of oral hypoglycemic drugs: Secondary | ICD-10-CM | POA: Diagnosis not present

## 2021-10-02 DIAGNOSIS — L89153 Pressure ulcer of sacral region, stage 3: Secondary | ICD-10-CM | POA: Diagnosis not present

## 2021-10-02 DIAGNOSIS — E119 Type 2 diabetes mellitus without complications: Secondary | ICD-10-CM | POA: Diagnosis not present

## 2021-10-02 DIAGNOSIS — G360 Neuromyelitis optica [Devic]: Secondary | ICD-10-CM | POA: Diagnosis not present

## 2021-10-02 DIAGNOSIS — Z9181 History of falling: Secondary | ICD-10-CM | POA: Diagnosis not present

## 2021-10-02 DIAGNOSIS — N400 Enlarged prostate without lower urinary tract symptoms: Secondary | ICD-10-CM | POA: Diagnosis not present

## 2021-10-02 DIAGNOSIS — Z8616 Personal history of COVID-19: Secondary | ICD-10-CM | POA: Diagnosis not present

## 2021-10-02 DIAGNOSIS — E538 Deficiency of other specified B group vitamins: Secondary | ICD-10-CM | POA: Diagnosis not present

## 2021-10-05 ENCOUNTER — Other Ambulatory Visit: Payer: Self-pay | Admitting: Internal Medicine

## 2021-10-05 DIAGNOSIS — E119 Type 2 diabetes mellitus without complications: Secondary | ICD-10-CM

## 2021-10-31 NOTE — Progress Notes (Signed)
Patient Care Team: Nicholas Lose, MD as PCP - General (Hematology and Oncology)  DIAGNOSIS:  Encounter Diagnosis  Name Primary?   Neutropenia, unspecified type ([place])      CHIEF COMPLIANT: Follow-up  of leukopenia.   INTERVAL HISTORY: Victor Christensen is a 51 y.o. with above-mentioned history of neuromyelitis and   leukopenia. He presents to the clinic today for a follow-up. He reports to the clinic today that he feels fine.  In June he had kidney stones and his white count has improved during that time but it has slowly come down since the kidney stones are no longer a problem.  He denies any recent infections or illnesses.  No new medications.  He goes around in a wheelchair.   ALLERGIES:  is allergic to duloxetine and oxycodone-acetaminophen.  MEDICATIONS:  Current Outpatient Medications  Medication Sig Dispense Refill   atorvastatin (LIPITOR) 10 MG tablet Take 1 tablet (10 mg total) by mouth daily. 90 tablet 1   Blood Glucose Monitoring Suppl (CONTOUR NEXT GEN MONITOR) w/Device KIT 1 Act by Does not apply route 2 (two) times daily. 2 kit 0   Cholecalciferol (VITAMIN D3 PO) Take 2,000 Units by mouth daily.     folic acid (FOLVITE) 1 MG tablet Take 1 tablet (1 mg total) by mouth daily. 90 tablet 1   glucose blood test strip 1 each by Other route 2 (two) times daily. Use as instructed 100 each 5   metFORMIN (GLUCOPHAGE) 500 MG tablet TAKE 1 TABLET(500 MG) BY MOUTH THREE TIMES DAILY WITH MEALS 270 tablet 1   ondansetron (ZOFRAN-ODT) 8 MG disintegrating tablet Take 1 tablet (8 mg total) by mouth every 8 (eight) hours as needed for nausea or vomiting. After lithotripsy 10 tablet 0   tamsulosin (FLOMAX) 0.4 MG CAPS capsule Take 0.4 mg by mouth at bedtime.     traMADol (ULTRAM) 50 MG tablet Take 1-2 tablets (50-100 mg total) by mouth every 6 (six) hours as needed for moderate pain or severe pain (after lithotripsy). 20 tablet 0   vitamin B-12 (CYANOCOBALAMIN) 1000 MCG tablet Take 1,000 mcg by  mouth daily.     No current facility-administered medications for this visit.    PHYSICAL EXAMINATION: ECOG PERFORMANCE STATUS: 1 - Symptomatic but completely ambulatory  Vitals:   11/06/21 1006  BP: 121/87  Pulse: 78  Resp: 18  Temp: (!) 97.3 F (36.3 C)  SpO2: 98%   Filed Weights   11/06/21 1006  Weight: 191 lb 11.2 oz (87 kg)      LABORATORY DATA:  I have reviewed the data as listed    Latest Ref Rng & Units 07/24/2021    6:33 AM 07/09/2021    3:49 PM 04/05/2021   11:31 AM  CMP  Glucose 70 - 99 mg/dL 214  151  111   BUN 6 - 20 mg/dL 21  11  9    Creatinine 0.61 - 1.24 mg/dL 1.37  1.16  1.37   Sodium 135 - 145 mmol/L 135  136  136   Potassium 3.5 - 5.1 mmol/L 4.8  4.4  4.8   Chloride 98 - 111 mmol/L 101  101  102   CO2 22 - 32 mmol/L 24  24  27    Calcium 8.9 - 10.3 mg/dL 9.0  9.4  9.9   Total Protein 6.0 - 8.3 g/dL   7.5   Total Bilirubin 0.2 - 1.2 mg/dL   0.6   Alkaline Phos 39 - 117 U/L   41  AST 0 - 37 U/L   22   ALT 0 - 53 U/L   34     Lab Results  Component Value Date   WBC 2.9 (L) 11/06/2021   HGB 12.6 (L) 11/06/2021   HCT 40.0 11/06/2021   MCV 80.5 11/06/2021   PLT 235 11/06/2021   NEUTROABS 1.5 (L) 11/06/2021    ASSESSMENT & PLAN:  Neutropenia (HCC) Neutropenia started 02/05/2018.  He was previously in [place] and moved to [place] for his wife. Lab review: 05/17/2019: WBC 3, hemoglobin 13.4, ANC 1.5 (it was 1.2 on 04/19/2019) B12: 181 ANA: Negative Folic acid: 6.1, LDH: 323 09/06/2019: WBC 4.5, hemoglobin 12.6, platelets 277 03/09/19:WBC: 3.9, Hb 12.5, ANC 2.2 08/01/2020: WBC 2.1, hemoglobin 12.6, platelets 219, ANC 1 11/06/2020: WBC 2.2, hemoglobin 12.6, ANC 1.1, platelets 185 11/06/2021: WBC 2.9, hemoglobin 12.6, platelets 235, ANC 1.5  Labs appear to be relatively stable. (His white blood cell count appears to go up periodically especially when he had kidney stones: This is a good sign that he can increase his counts as needed)   Current  treatment: B12 supplementation 1000 mcg sublingually daily Follow-up in 1 year with labs    Orders Placed This Encounter  Procedures   CBC with Differential ([facility] Only)    Standing Status:   Future    Standing Expiration Date:   11/07/2022   The patient has a good understanding of the overall plan. he agrees with it. he will call with any problems that may develop before the next visit here. Total time spent: 30 mins including face to face time and time spent for planning, charting and co-ordination of care   Harriette Ohara, MD 11/06/21    I Gardiner Coins am scribing for Dr. Lindi Adie  I have reviewed the above documentation for accuracy and completeness, and I agree with the above.

## 2021-11-06 ENCOUNTER — Inpatient Hospital Stay: Payer: Medicare PPO

## 2021-11-06 ENCOUNTER — Inpatient Hospital Stay: Payer: Medicare PPO | Attending: Hematology and Oncology | Admitting: Hematology and Oncology

## 2021-11-06 DIAGNOSIS — D709 Neutropenia, unspecified: Secondary | ICD-10-CM | POA: Insufficient documentation

## 2021-11-06 LAB — CBC WITH DIFFERENTIAL (CANCER CENTER ONLY)
Abs Immature Granulocytes: 0.01 10*3/uL (ref 0.00–0.07)
Basophils Absolute: 0 10*3/uL (ref 0.0–0.1)
Basophils Relative: 1 %
Eosinophils Absolute: 0.2 10*3/uL (ref 0.0–0.5)
Eosinophils Relative: 6 %
HCT: 40 % (ref 39.0–52.0)
Hemoglobin: 12.6 g/dL — ABNORMAL LOW (ref 13.0–17.0)
Immature Granulocytes: 0 %
Lymphocytes Relative: 27 %
Lymphs Abs: 0.8 10*3/uL (ref 0.7–4.0)
MCH: 25.4 pg — ABNORMAL LOW (ref 26.0–34.0)
MCHC: 31.5 g/dL (ref 30.0–36.0)
MCV: 80.5 fL (ref 80.0–100.0)
Monocytes Absolute: 0.4 10*3/uL (ref 0.1–1.0)
Monocytes Relative: 14 %
Neutro Abs: 1.5 10*3/uL — ABNORMAL LOW (ref 1.7–7.7)
Neutrophils Relative %: 52 %
Platelet Count: 235 10*3/uL (ref 150–400)
RBC: 4.97 MIL/uL (ref 4.22–5.81)
RDW: 14.2 % (ref 11.5–15.5)
WBC Count: 2.9 10*3/uL — ABNORMAL LOW (ref 4.0–10.5)
nRBC: 0 % (ref 0.0–0.2)

## 2021-11-06 NOTE — Assessment & Plan Note (Addendum)
Neutropenia started 02/05/2018. He was previously in [place] and moved to [place] for his wife. Lab review: 05/17/2019: WBC 3, hemoglobin 13.4, ANC 1.5 (it was 1.2 on 04/19/2019) B12: 181 ANA: Negative Folic acid: 8.3,FXO: 329 09/06/2019: WBC 4.5, hemoglobin 12.6, platelets 277 03/09/19:WBC: 3.9, Hb 12.5, ANC 2.2 08/01/2020: WBC 2.1, hemoglobin 12.6, platelets 219, ANC 1 11/06/2020: WBC 2.2, hemoglobin 12.6, ANC 1.1, platelets 185 11/06/2021: WBC 2.9, hemoglobin 12.6, platelets 235, ANC 1.5  Labs appear to be relatively stable. (His white blood cell count appears to go up periodically especially when he had kidney stones: This is a good sign that he can increase his counts as needed)  Current treatment: B12 supplementation1000 mcg sublingually daily Follow-up in 1 year with labs

## 2021-11-23 ENCOUNTER — Other Ambulatory Visit: Payer: Self-pay | Admitting: Internal Medicine

## 2021-11-23 DIAGNOSIS — E538 Deficiency of other specified B group vitamins: Secondary | ICD-10-CM

## 2022-03-04 ENCOUNTER — Encounter: Payer: Self-pay | Admitting: Neurology

## 2022-03-04 ENCOUNTER — Ambulatory Visit: Payer: Medicare PPO | Admitting: Neurology

## 2022-03-04 VITALS — BP 120/84 | HR 76

## 2022-03-04 DIAGNOSIS — D72819 Decreased white blood cell count, unspecified: Secondary | ICD-10-CM | POA: Diagnosis not present

## 2022-03-04 DIAGNOSIS — G369 Acute disseminated demyelination, unspecified: Secondary | ICD-10-CM

## 2022-03-04 NOTE — Progress Notes (Signed)
Chief Complaint  Patient presents with   Follow-up    Rm 14 here for 6 month f/u. Reports he is doing well no complaints.       ASSESSMENT AND PLAN  Victor Christensen is a 52 y.o. male   History of neuromyelitis since 2012, involving cervical and thoracic spinal cord  He was on CellCept 500 mg 2 tablets twice a day, due to leukopenia , which did improve after holding CellCept for a while, was seen by hematologist Dr. 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 Ashley Royalty,  pssible small rAPD OS,    Will continue follow-up with his primary care physician, return to clinic for new issues  DIAGNOSTIC DATA (LABS, IMAGING, TESTING) - I reviewed patient records, labs, notes, testing and imaging myself where available.   HISTORICAL Victor Christensen is a 52 year old male, accompanied by his wife Victor Christensen, seen in request by previous neurologist Dr. 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   I have reviewed and summarized the referring note from the referring physician.  He had a past medical history of diabetes since 2001, taking Metformin   In 2012, he presented with subacute onset of numbness upper and lower extremity, gait abnormality, incontinence, there was no visual loss   MRI of brain was negative, cervical spine with and without contrast showed contrast-enhancing cervical lesions, thoracic spine also showed contrast-enhancing upper cord edema,   CSF showed WBC of 215, protein of 258, 2 oligoclonal banding, IgG index was mildly elevated 0.7, serum NML  antibody was negative, visual evoked potential was negative   He was diagnosed with neuromyelitis, was treated with rituximab 1000 mg IV on June 2012, August 07, 2010, January 2013, August 2013, February 2014, with treatment, he was able to regain some function, able to ambulate with walker, upper extremity strength mostly recovered.   While taking rituximab infusion, he had a few flareups, presented with worsening bilateral lower extremity weakness, sensory changes, eventually lost mobility in April 2014, become wheelchair-bound, he was seen at [facility] in 2014, was put on CellCept 500 mg 2 tablets twice a day ever since 2014.   He had another bouts of flareup in 2016, presented with worsening lower extremity neuropathic pain, He was treated with IV Solu-Medrol in October 2016, followed by plasma exchange in October 2016, with significant improvement of his lower extremity neuropathic pain   He has been on stable dose of CellCept 500 mg 2 tablets twice a day since.   He spent most of the day in wheelchair, has fingertips paresthesia, mild hand grip weakness, but overall upper extremity function well, able to transfer himself in and out of wheelchair, driving with a modified car, keep a bowel regimen, wear depends.   UPDATE Mar 22 2019: Record from [facility] on October 22, 2014, MRI of cervical spine with and without contrast, there is patchy signal abnormality, extends from the cervical medullary junction eccentric towards the left, along the central and dorsal aspect of the cord C2, 3, right dorsal cord C6, and patchy signal abnormality at C7, upper thoracic cord, on the sagittal STIR imagings or  thoracic spinal cord, there are diffuse patchy signal abnormality in the cord, and mild diffuse volume loss, there is irregular linear band of enhancement cervical spinal cord extending from posterior cervical medullary junction to the C6 level, also at T1-T2, T2-T3 level finding appear to  progress since most recent MRI cervical spine in 2015, but similar to more remote cervical spine imaging in 2014, no additional enhancing abnormality identified in the remainder of the thoracic spinal cord,Tiny disc protrusion at T3-T4,   I personally reviewed MRI of spines with without contrast in September 2020 There is generalized atrophy of cervical and upper thoracic spine, with patchy signal changes, correlate with remote insult in this patient, Cord: Diffusely small cord measuring only 3-4 mm anterior to posterior at the cervicothoracic junction. Cord also has heterogeneous signal on T2 and STIR imaging, attributed to gliosis from prior demyelination. No expansion/swelling and no abnormal enhancement.   MRI of the brain was normal   Laboratory evaluation in August 2020 showed negative NMO  antibody, mild elevated A1c 7.1, CMP showed mild elevated glucose 122, creatinine of 1.21, normal or negative ANA, HIV, RPR, copper, ferritin, vitamin D level was 13.7, decreased WBC of 2.9, ESR was mildly elevated 26, normal CPK, TSH, C-reactive protein, B12,   UPDATE July 19 2019: He was noted to have decreased WBC 2.8 in laboratory evaluation, CellCept was stopped at the end of March 2021, was referred to hematologist,   I was able to review his hematology evaluation by Dr. Lindi Adie on May 17, 2019, neutropenia, WBC of 2.8, ANC of 1.2, noticed neutropenia since January 2019, repeat laboratory evaluation showed WBC of 3, ANC was 1.5, B12 was 181, negative ANA, folic acid 6.1, LDH of 150   Based on these laboratory evaluations, B12 deficiency is likely account for his neutropenia and, hematologist Dr.  Lindi Adie  have suggested resume CellCept if it is necessary   I also talked with his dermatologist Dr. 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, suspicious for cutaneous sarcoidosis, low likelihood of infectious disease, per patient, chest  x-ray was negative, culture was negative, he was given steroid cream, which has helped his symptoms   Since CellCept was stopped in March 2021, most recent CBC was on May 17, 2019, WBC was 3.0,   UPDATE July 5th 2022: He is accompanied by his wife at today's visit, complains of increased right shoulder pain since beginning of 2022, he also noticed significant tenderness at upper biceps region, he cannot clearly identify a trigger event, but he does like to throw  ball using his right hand, he workout his upper extremity regularly, rely on his shoulder and arm to transfer in and out of wheelchair, over the past 6 months, his right shoulder and arm discomfort consistent, he noticed more than right hand numbness tingling, mild right-sided neck pain,  He was given prednisone tapering dose for 1 week, it does help him some  He is now on CellCept 500 mg 1 tablet twice a day, previously when he was on higher dose 2 tablets twice a day, there was noticeable leukopenia, CellCept was on hold, he was seen by hematologist, no etiology was found, it was okay per hematology to restart CellCept, which she is now taking 500 mg twice daily since June 2021, he denies significant ill effect  UPDATE Feb 01 2021: He has stopped taking CellCept given low-dose 500 mg twice a day since last visit in July 2022, no  flareup of his monitor his symptoms, right shoulder was diagnosed with right rotator cuff, improved with orthopedic care including intra-articular injection  He is overall doing very well, since stopped taking CellCept, he no longer have body achy pain, not taking gabapentin anymore,  UPDATE Mar 04 2022: He drove himself to clinic today, using hand driving device, came in wheelchair, he has stopped CellCept since June 2022, most recent WBC is 2.9, hemoglobin of 12.6,  He is overall stable, exercise daily, no visual loss, arm still relatively strong, able to control his bowel and bladder during the day, wear  depends at nighttime,  PHYSICAL EXAM:   Vitals:   03/04/22 0954  BP: 120/84  Pulse: 76  SpO2: 97%    There is no height or weight on file to calculate BMI.  PHYSICAL EXAMNIATION:  Gen: NAD, conversant, well nourised, well groomed         NEUROLOGICAL EXAM:  MENTAL STATUS: Speech/cognition Awake, alert, oriented to history taking and casual conversation CRANIAL NERVES: CN II: Visual fields are full to confrontation. Pupils are round equal and briskly reactive to light. CN III, IV, VI: extraocular movement are normal. No ptosis. CN V: Facial sensation is intact to light touch CN VII: Face is symmetric with normal eye closure  CN VIII: Hearing is normal to causal conversation. CN IX, X: Phonation is normal. CN XI: Head turning and shoulder shrug are intact  MOTOR: Mild bilateral shoulder abduction, elbow flexion weakness,  REFLEXES: Reflexes are 1 and symmetric at the biceps, triceps, 3/3 knees,   SENSORY: Sensory level to navel   COORDINATION: There is no trunk or limb dysmetria noted.  GAIT/STANCE: Deferred  REVIEW OF SYSTEMS:  Full 14 system review of systems performed and notable only for as above All other review of systems were negative.   ALLERGIES: Allergies  Allergen Reactions   Duloxetine Diarrhea and Other (See Comments)    insomnia   Oxycodone-Acetaminophen Other (See Comments)    Makes him loopy     HOME MEDICATIONS: Current Outpatient Medications  Medication Sig Dispense Refill   atorvastatin (LIPITOR) 10 MG tablet Take 1 tablet (10 mg total) by mouth daily. 90 tablet 1   Blood Glucose Monitoring Suppl (CONTOUR NEXT GEN MONITOR) w/Device KIT 1 Act by Does not apply route 2 (two) times daily. 2 kit 0   Cholecalciferol (VITAMIN D3 PO) Take 2,000 Units by mouth daily.     folic acid (FOLVITE) 1 MG tablet TAKE 1 TABLET(1 MG) BY MOUTH DAILY 90 tablet 1   glucose blood test strip 1 each by Other route 2 (two) times daily. Use as instructed 100  each 5   metFORMIN (GLUCOPHAGE) 500 MG tablet TAKE 1 TABLET(500 MG) BY MOUTH THREE TIMES DAILY WITH MEALS 270 tablet 1   tamsulosin (FLOMAX) 0.4 MG CAPS capsule Take 0.4 mg by mouth at bedtime.     vitamin B-12 (CYANOCOBALAMIN) 1000 MCG tablet Take 1,000 mcg by mouth daily.     ondansetron (ZOFRAN-ODT) 8 MG disintegrating tablet Take 1 tablet (8 mg total) by mouth every 8 (eight) hours as needed for nausea or vomiting. After lithotripsy 10 tablet 0   traMADol (ULTRAM) 50 MG tablet Take 1-2 tablets (50-100 mg total) by mouth every 6 (six) hours as needed for moderate pain or severe pain (after lithotripsy). 20 tablet 0   No current facility-administered medications for this visit.    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[place])    Paraplegia ([place]) 2014   per Patient    PAST SURGICAL HISTORY: Past Surgical History:  Procedure Laterality Date   CYSTOSCOPY W/ URETERAL STENT PLACEMENT Left 07/09/2021   Procedure: CYSTOSCOPY WITH RETROGRADE PYELOGRAM/URETERAL STENT PLACEMENT;  Surgeon: Vira Agar, MD;  Location: [facility];  Service: Urology;  Laterality: Left;   CYSTOSCOPY WITH STENT PLACEMENT Left 01/15/2018   Procedure: CYSTOSCOPY WITH STENT PLACEMENT retrograde pylegram;  Surgeon: Franchot Gallo, MD;  Location: [facility];  Service: Urology;  Laterality: Left;   CYSTOSCOPY/URETEROSCOPY/HOLMIUM LASER/STENT PLACEMENT Left 02/05/2018   Procedure: CYSTOSCOPY/URETEROSCOPY/HOLMIUM LASER/STENT EXTRACTION WITH LEFT STENT PLACEMENT;  Surgeon: Franchot Gallo, MD;  Location: [facility];  Service: Urology;  Laterality: Left;   CYSTOSCOPY/URETEROSCOPY/HOLMIUM LASER/STENT PLACEMENT Left 07/24/2021   Procedure: LEFT URETEROSCOPY/HOLMIUM LASER/STENT PLACEMENT;  Surgeon: Vira Agar, MD;  Location: [facility];  Service: Urology;  Laterality: Left;  90 MINUTES    EXTRACORPOREAL SHOCK WAVE LITHOTRIPSY Right 09/10/2021   Procedure: EXTRACORPOREAL SHOCK WAVE LITHOTRIPSY (ESWL);  Surgeon: Alexis Frock, MD;  Location[facility];  Service: Urology;  Laterality: Right;    FAMILY HISTORY: Family History  Problem Relation Age of Onset   Hypertension Mother    Hypertension Father    Diabetes Father     SOCIAL HISTORY: Social History   Socioeconomic History   Marital status: Married    Spouse name: Not on file   Number of children: 2   Years of education: college   Highest education level: Bachelor's degree (e.g., BA, AB, BS)  Occupational History   Occupation: Disabled  Tobacco Use   Smoking status: Never   Smokeless tobacco: Never  Vaping Use   Vaping Use: Never used  Substance and Sexual Activity   Alcohol use: Not Currently    Comment: social   Drug use: Never   Sexual activity: Not Currently  Other Topics Concern   Not on file  Social History Narrative   Lives at home with his wife and son.   Right-handed.   Moderate caffeine use.   Social Determinants of Health   Financial Resource Strain: Low Risk  (10/09/2018)   Overall Financial Resource Strain (CARDIA)    Difficulty of Paying Living Expenses: Not hard at all  Food Insecurity: No Food Insecurity (10/09/2018)   Hunger Vital Sign    Worried About Running Out of Food in the Last Year: Never true    Ran Out of Food in the Last Year: Never true  Transportation Needs: No Transportation Needs (10/09/2018)   PRAPARE - Hydrologist (Medical): No    Lack of Transportation (Non-Medical): No  Physical Activity: Insufficiently Active (10/09/2018)   Exercise Vital Sign    Days of Exercise per Week: 3 days    Minutes of Exercise per Session: 30 min  Stress: Stress Concern Present (10/09/2018)   [place]    Feeling of Stress : To some extent  Social Connections: Unknown  (10/09/2018)   Social Connection and Isolation Panel [NHANES]    Frequency of Communication with Friends and Family: Three times a week    Frequency of Social Gatherings with Friends and Family: Twice a week    Attends Religious Services: Patient refused    Active Member of Clubs or Organizations: Patient refused    Attends Archivist  Meetings: Patient refused    Marital Status: Married  Human resources officer Violence: Not At Risk (10/09/2018)   Humiliation, Afraid, Rape, and Kick questionnaire    Fear of Current or Ex-Partner: No    Emotionally Abused: No    Physically Abused: No    Sexually Abused: No      Marcial Pacas, M.D. Ph.D.  [facility] [address] [place], [place] [address] Ph: [phone] Fax: [phone]  CC:  Janith Lima, MD [place],  [place] [address]  Nicholas Lose, MD

## 2022-04-04 ENCOUNTER — Encounter: Payer: Self-pay | Admitting: Internal Medicine

## 2022-04-04 ENCOUNTER — Ambulatory Visit: Payer: Medicare PPO | Admitting: Internal Medicine

## 2022-04-04 VITALS — BP 128/84 | HR 97 | Temp 98.4°F | Resp 16 | Ht 71.0 in

## 2022-04-04 DIAGNOSIS — E538 Deficiency of other specified B group vitamins: Secondary | ICD-10-CM

## 2022-04-04 DIAGNOSIS — E785 Hyperlipidemia, unspecified: Secondary | ICD-10-CM

## 2022-04-04 DIAGNOSIS — Z125 Encounter for screening for malignant neoplasm of prostate: Secondary | ICD-10-CM | POA: Diagnosis not present

## 2022-04-04 DIAGNOSIS — Z0001 Encounter for general adult medical examination with abnormal findings: Secondary | ICD-10-CM

## 2022-04-04 DIAGNOSIS — E119 Type 2 diabetes mellitus without complications: Secondary | ICD-10-CM

## 2022-04-04 LAB — CBC WITH DIFFERENTIAL/PLATELET
Basophils Absolute: 0 10*3/uL (ref 0.0–0.1)
Basophils Relative: 0.8 % (ref 0.0–3.0)
Eosinophils Absolute: 0.1 10*3/uL (ref 0.0–0.7)
Eosinophils Relative: 4.7 % (ref 0.0–5.0)
HCT: 39.6 % (ref 39.0–52.0)
Hemoglobin: 12.8 g/dL — ABNORMAL LOW (ref 13.0–17.0)
Lymphocytes Relative: 25.5 % (ref 12.0–46.0)
Lymphs Abs: 0.7 10*3/uL (ref 0.7–4.0)
MCHC: 32.3 g/dL (ref 30.0–36.0)
MCV: 76.7 fl — ABNORMAL LOW (ref 78.0–100.0)
Monocytes Absolute: 0.5 10*3/uL (ref 0.1–1.0)
Monocytes Relative: 16.8 % — ABNORMAL HIGH (ref 3.0–12.0)
Neutro Abs: 1.5 10*3/uL (ref 1.4–7.7)
Neutrophils Relative %: 52.2 % (ref 43.0–77.0)
Platelets: 266 10*3/uL (ref 150.0–400.0)
RBC: 5.16 Mil/uL (ref 4.22–5.81)
RDW: 15.6 % — ABNORMAL HIGH (ref 11.5–15.5)
WBC: 2.8 10*3/uL — ABNORMAL LOW (ref 4.0–10.5)

## 2022-04-04 LAB — BASIC METABOLIC PANEL
BUN: 16 mg/dL (ref 6–23)
CO2: 29 mEq/L (ref 19–32)
Calcium: 10 mg/dL (ref 8.4–10.5)
Chloride: 100 mEq/L (ref 96–112)
Creatinine, Ser: 1.23 mg/dL (ref 0.40–1.50)
GFR: 68.39 mL/min (ref >60.00)
Glucose, Bld: 99 mg/dL (ref 70–99)
Potassium: 4.9 mEq/L (ref 3.5–5.1)
Sodium: 136 mEq/L (ref 135–145)

## 2022-04-04 LAB — URINALYSIS, ROUTINE W REFLEX MICROSCOPIC
Bilirubin Urine: NEGATIVE
Hgb urine dipstick: NEGATIVE
Ketones, ur: NEGATIVE
Leukocytes,Ua: NEGATIVE
Nitrite: NEGATIVE
Specific Gravity, Urine: 1.02 (ref 1.000–1.030)
Total Protein, Urine: NEGATIVE
Urine Glucose: NEGATIVE
Urobilinogen, UA: 1 (ref 0.0–1.0)
pH: 6.5 (ref 5.0–8.0)

## 2022-04-04 LAB — HEMOGLOBIN A1C: Hgb A1c MFr Bld: 6.4 % (ref 4.6–6.5)

## 2022-04-04 LAB — TSH: TSH: 1.52 u[IU]/mL (ref 0.35–5.50)

## 2022-04-04 LAB — MICROALBUMIN / CREATININE URINE RATIO
Creatinine,U: 129.5 mg/dL
Microalb Creat Ratio: 0.7 mg/g (ref 0.0–30.0)
Microalb, Ur: 0.9 mg/dL (ref 0.0–1.9)

## 2022-04-04 LAB — HEPATIC FUNCTION PANEL
ALT: 12 U/L (ref 0–53)
AST: 13 U/L (ref 0–37)
Albumin: 4 g/dL (ref 3.5–5.2)
Alkaline Phosphatase: 47 U/L (ref 39–117)
Bilirubin, Direct: 0.1 mg/dL (ref 0.0–0.3)
Total Bilirubin: 0.4 mg/dL (ref 0.2–1.2)
Total Protein: 7.5 g/dL (ref 6.0–8.3)

## 2022-04-04 LAB — LIPID PANEL
Cholesterol: 121 mg/dL (ref 0–200)
HDL: 32.9 mg/dL — ABNORMAL LOW (ref >39.00)
LDL Cholesterol: 51 mg/dL (ref 0–99)
NonHDL: 88.03
Total CHOL/HDL Ratio: 4
Triglycerides: 183 mg/dL — ABNORMAL HIGH (ref 0.0–149.0)
VLDL: 36.6 mg/dL (ref 0.0–40.0)

## 2022-04-04 LAB — VITAMIN B12: Vitamin B-12: 187 pg/mL — ABNORMAL LOW (ref 211–911)

## 2022-04-04 LAB — PSA: PSA: 0.33 ng/mL (ref 0.10–4.00)

## 2022-04-04 LAB — FOLATE: Folate: >23.9 ng/mL (ref >5.9)

## 2022-04-04 NOTE — Patient Instructions (Signed)
Health Maintenance, Male Adopting a healthy lifestyle and getting preventive care are important in promoting health and wellness. Ask your health care provider about: The right schedule for you to have regular tests and exams. Things you can do on your own to prevent diseases and keep yourself healthy. What should I know about diet, weight, and exercise? Eat a healthy diet  Eat a diet that includes plenty of vegetables, fruits, low-fat dairy products, and lean protein. Do not eat a lot of foods that are high in solid fats, added sugars, or sodium. Maintain a healthy weight Body mass index (BMI) is a measurement that can be used to identify possible weight problems. It estimates body fat based on height and weight. Your health care provider can help determine your BMI and help you achieve or maintain a healthy weight. Get regular exercise Get regular exercise. This is one of the most important things you can do for your health. Most adults should: Exercise for at least 150 minutes each week. The exercise should increase your heart rate and make you sweat (moderate-intensity exercise). Do strengthening exercises at least twice a week. This is in addition to the moderate-intensity exercise. Spend less time sitting. Even light physical activity can be beneficial. Watch cholesterol and blood lipids Have your blood tested for lipids and cholesterol at 51 years of age, then have this test every 5 years. You may need to have your cholesterol levels checked more often if: Your lipid or cholesterol levels are high. You are older than 51 years of age. You are at high risk for heart disease. What should I know about cancer screening? Many types of cancers can be detected early and may often be prevented. Depending on your health history and family history, you may need to have cancer screening at various ages. This may include screening for: Colorectal cancer. Prostate cancer. Skin cancer. Lung  cancer. What should I know about heart disease, diabetes, and high blood pressure? Blood pressure and heart disease High blood pressure causes heart disease and increases the risk of stroke. This is more likely to develop in people who have high blood pressure readings or are overweight. Talk with your health care provider about your target blood pressure readings. Have your blood pressure checked: Every 3-5 years if you are 18-39 years of age. Every year if you are 40 years old or older. If you are between the ages of 65 and 75 and are a current or former smoker, ask your health care provider if you should have a one-time screening for abdominal aortic aneurysm (AAA). Diabetes Have regular diabetes screenings. This checks your fasting blood sugar level. Have the screening done: Once every three years after age 45 if you are at a normal weight and have a low risk for diabetes. More often and at a younger age if you are overweight or have a high risk for diabetes. What should I know about preventing infection? Hepatitis B If you have a higher risk for hepatitis B, you should be screened for this virus. Talk with your health care provider to find out if you are at risk for hepatitis B infection. Hepatitis C Blood testing is recommended for: Everyone born from 1945 through 1965. Anyone with known risk factors for hepatitis C. Sexually transmitted infections (STIs) You should be screened each year for STIs, including gonorrhea and chlamydia, if: You are sexually active and are younger than 51 years of age. You are older than 51 years of age and your   health care provider tells you that you are at risk for this type of infection. Your sexual activity has changed since you were last screened, and you are at increased risk for chlamydia or gonorrhea. Ask your health care provider if you are at risk. Ask your health care provider about whether you are at high risk for HIV. Your health care provider  may recommend a prescription medicine to help prevent HIV infection. If you choose to take medicine to prevent HIV, you should first get tested for HIV. You should then be tested every 3 months for as long as you are taking the medicine. Follow these instructions at home: Alcohol use Do not drink alcohol if your health care provider tells you not to drink. If you drink alcohol: Limit how much you have to 0-2 drinks a day. Know how much alcohol is in your drink. In the [place] one drink equals one 12 oz bottle of beer (355 mL), one 5 oz glass of wine (148 mL), or one 1 oz glass of hard liquor (44 mL). Lifestyle Do not use any products that contain nicotine or tobacco. These products include cigarettes, chewing tobacco, and vaping devices, such as e-cigarettes. If you need help quitting, ask your health care provider. Do not use street drugs. Do not share needles. Ask your health care provider for help if you need support or information about quitting drugs. General instructions Schedule regular health, dental, and eye exams. Stay current with your vaccines. Tell your health care provider if: You often feel depressed. You have ever been abused or do not feel safe at home. Summary Adopting a healthy lifestyle and getting preventive care are important in promoting health and wellness. Follow your health care provider's instructions about healthy diet, exercising, and getting tested or screened for diseases. Follow your health care provider's instructions on monitoring your cholesterol and blood pressure. This information is not intended to replace advice given to you by your health care provider. Make sure you discuss any questions you have with your health care provider. Document Revised: 06/05/2020 Document Reviewed: 06/05/2020 Elsevier Patient Education  2023 [employer]  

## 2022-04-04 NOTE — Progress Notes (Signed)
Subjective:  Patient ID: Victor Christensen, male    DOB: 02-21-1970  Age: 52 y.o. MRN: [id number]  CC: Annual Exam, Hyperlipidemia, and Diabetes   HPI Victor Christensen presents for a CPX and f/up -----  He has felt well recently and offers no complaints.  He denies cough, chest pain, shortness of breath, or edema.  Outpatient Medications Prior to Visit  Medication Sig Dispense Refill   Blood Glucose Monitoring Suppl (CONTOUR NEXT GEN MONITOR) w/Device KIT 1 Act by Does not apply route 2 (two) times daily. 2 kit 0   Cholecalciferol (VITAMIN D3 PO) Take 2,000 Units by mouth daily.     glucose blood test strip 1 each by Other route 2 (two) times daily. Use as instructed 100 each 5   tamsulosin (FLOMAX) 0.4 MG CAPS capsule Take 0.4 mg by mouth at bedtime.     atorvastatin (LIPITOR) 10 MG tablet Take 1 tablet (10 mg total) by mouth daily. 90 tablet 1   folic acid (FOLVITE) 1 MG tablet TAKE 1 TABLET(1 MG) BY MOUTH DAILY 90 tablet 1   metFORMIN (GLUCOPHAGE) 500 MG tablet TAKE 1 TABLET(500 MG) BY MOUTH THREE TIMES DAILY WITH MEALS 270 tablet 1   vitamin B-12 (CYANOCOBALAMIN) 1000 MCG tablet Take 1,000 mcg by mouth daily.     traMADol (ULTRAM) 50 MG tablet Take 1-2 tablets (50-100 mg total) by mouth every 6 (six) hours as needed for moderate pain or severe pain (after lithotripsy). 20 tablet 0   ondansetron (ZOFRAN-ODT) 8 MG disintegrating tablet Take 1 tablet (8 mg total) by mouth every 8 (eight) hours as needed for nausea or vomiting. After lithotripsy 10 tablet 0   No facility-administered medications prior to visit.    ROS Review of Systems  Constitutional: Negative.  Negative for chills, diaphoresis, fatigue and fever.  HENT: Negative.    Eyes: Negative.   Respiratory:  Negative for cough, chest tightness, shortness of breath and wheezing.   Cardiovascular:  Negative for chest pain, palpitations and leg swelling.  Gastrointestinal:  Negative for abdominal pain, diarrhea and nausea.   Endocrine: Negative.   Genitourinary:  Positive for difficulty urinating. Negative for dysuria and hematuria.  Musculoskeletal:  Positive for gait problem. Negative for arthralgias and myalgias.  Skin: Negative.  Negative for color change and pallor.  Neurological:  Positive for weakness. Negative for dizziness.  Hematological:  Negative for adenopathy. Does not bruise/bleed easily.  Psychiatric/Behavioral: Negative.      Objective:  BP 128/84 (BP Location: Left Arm, Patient Position: Sitting, Cuff Size: Normal)   Pulse 97   Temp 98.4 F (36.9 C) (Oral)   Resp 16   Ht 5\' 11"  (1.803 m)   SpO2 97%   BMI 26.74 kg/m   BP Readings from Last 3 Encounters:  04/04/22 128/84  03/04/22 120/84  11/06/21 121/87    Wt Readings from Last 3 Encounters:  11/06/21 191 lb 11.2 oz (87 kg)  09/10/21 200 lb (90.7 kg)  07/24/21 199 lb 15.3 oz (90.7 kg)    Physical Exam Vitals reviewed.  Constitutional:      Appearance: He is not ill-appearing (in a wheelchair).  HENT:     Nose: Nose normal.     Mouth/Throat:     Mouth: Mucous membranes are moist.  Eyes:     General: No scleral icterus.    Conjunctiva/sclera: Conjunctivae normal.  Cardiovascular:     Rate and Rhythm: Normal rate and regular rhythm.     Heart sounds: No murmur heard. Pulmonary:  Effort: Pulmonary effort is normal.     Breath sounds: No stridor. No wheezing, rhonchi or rales.  Abdominal:     General: Abdomen is flat.     Palpations: There is no mass.     Tenderness: There is no abdominal tenderness. There is no guarding.     Hernia: No hernia is present.  Musculoskeletal:        General: Normal range of motion.     Cervical back: Neck supple.     Right lower leg: No edema.     Left lower leg: No edema.  Lymphadenopathy:     Cervical: No cervical adenopathy.  Skin:    General: Skin is warm and dry.  Neurological:     General: No focal deficit present.     Mental Status: He is alert.  Psychiatric:         Mood and Affect: Mood normal.        Behavior: Behavior normal.     Lab Results  Component Value Date   WBC 2.8 (L) 04/04/2022   HGB 12.8 (L) 04/04/2022   HCT 39.6 04/04/2022   PLT 266.0 04/04/2022   GLUCOSE 99 04/04/2022   CHOL 121 04/04/2022   TRIG 183.0 (H) 04/04/2022   HDL 32.90 (L) 04/04/2022   LDLCALC 51 04/04/2022   ALT 12 04/04/2022   AST 13 04/04/2022   NA 136 04/04/2022   K 4.9 04/04/2022   CL 100 04/04/2022   CREATININE 1.23 04/04/2022   BUN 16 04/04/2022   CO2 29 04/04/2022   TSH 1.52 04/04/2022   PSA 0.33 04/04/2022   HGBA1C 6.4 04/04/2022   MICROALBUR 0.9 04/04/2022    DG Abd 1 View  Result Date: 09/10/2021 CLINICAL DATA:  Preop for lithotripsy of RIGHT-sided stone. EXAM: ABDOMEN - 1 VIEW COMPARISON:  Plain film of the abdomen dated 08/28/2021. FINDINGS: 4 mm stone projecting over the lower pole of the RIGHT kidney. No LEFT-sided renal stone identified. No evidence of ureteral stone seen. Vascular phleboliths within the LEFT lower pelvis. Visualized bowel gas pattern is nonobstructive. No evidence of soft tissue mass or abnormal fluid collection. No acute-appearing osseous abnormality seen. IMPRESSION: 4 mm stone projecting over the lower pole of the RIGHT kidney. Electronically Signed   By: Franki Cabot M.D.   On: 09/10/2021 07:53    Assessment & Plan:   Victor Christensen was seen today for annual exam, hyperlipidemia and diabetes.  Diagnoses and all orders for this visit:  Type 2 diabetes mellitus without complication, without long-term current use of insulin ([place])- His A1c is down to 6.4%.  Will decrease the dose of metformin. -     Urinalysis, Routine w reflex microscopic; Future -     Hemoglobin A1c; Future -     Microalbumin / creatinine urine ratio; Future -     Basic metabolic panel; Future -     HM Diabetes Foot Exam -     Basic metabolic panel -     Microalbumin / creatinine urine ratio -     Hemoglobin A1c -     Urinalysis, Routine w reflex  microscopic -     metFORMIN (GLUCOPHAGE) 500 MG tablet; Take 1 tablet (500 mg total) by mouth 2 (two) times daily with a meal.  Vitamin B12 deficiency -     Vitamin B12; Future -     CBC with Differential/Platelet; Future -     Folate; Future -     Folate -     CBC  with Differential/Platelet -     Vitamin B12 -     cyanocobalamin (VITAMIN B12) 1000 MCG tablet; Take 1 tablet (1,000 mcg total) by mouth daily.  Dyslipidemia, goal LDL below 100- LDL goal achieved. Doing well on the statin  -     Lipid panel; Future -     TSH; Future -     Hepatic function panel; Future -     Hepatic function panel -     TSH -     Lipid panel -     atorvastatin (LIPITOR) 10 MG tablet; Take 1 tablet (10 mg total) by mouth daily.  Screening for prostate cancer -     PSA; Future -     PSA  Folate deficiency -     Vitamin B12; Future -     CBC with Differential/Platelet; Future -     Folate; Future -     Folate -     CBC with Differential/Platelet -     Vitamin W29 -     folic acid (FOLVITE) 1 MG tablet; Take 1 tablet (1 mg total) by mouth daily.  Encounter for general adult medical examination with abnormal findings- Exam completed, labs reviewed, vaccines reviewed, cancer screenings are up-to-date, patient education was given.   I have discontinued Jyren Dragan's ondansetron. I have also changed his cyanocobalamin, folic acid, and metFORMIN. Additionally, I am having him maintain his Cholecalciferol (VITAMIN D3 PO), Contour Next Gen Monitor, glucose blood, tamsulosin, traMADol, and atorvastatin.  Meds ordered this encounter  Medications   cyanocobalamin (VITAMIN B12) 1000 MCG tablet    Sig: Take 1 tablet (1,000 mcg total) by mouth daily.    Dispense:  90 tablet    Refill:  1   atorvastatin (LIPITOR) 10 MG tablet    Sig: Take 1 tablet (10 mg total) by mouth daily.    Dispense:  90 tablet    Refill:  1   folic acid (FOLVITE) 1 MG tablet    Sig: Take 1 tablet (1 mg total) by mouth daily.     Dispense:  90 tablet    Refill:  1   metFORMIN (GLUCOPHAGE) 500 MG tablet    Sig: Take 1 tablet (500 mg total) by mouth 2 (two) times daily with a meal.    Dispense:  180 tablet    Refill:  1     Follow-up: Return in about 6 months (around 10/05/2022).  [name], MD

## 2022-04-05 MED ORDER — ATORVASTATIN CALCIUM 10 MG PO TABS: 10.0000 mg | ORAL_TABLET | Freq: Every day | ORAL | 1 refills | Status: DC

## 2022-04-05 MED ORDER — FOLIC ACID 1 MG PO TABS: 1.0000 mg | ORAL_TABLET | Freq: Every day | ORAL | 1 refills | Status: DC

## 2022-04-05 MED ORDER — VITAMIN B-12 1000 MCG PO TABS: 1000.0000 ug | ORAL_TABLET | Freq: Every day | ORAL | 1 refills | Status: DC

## 2022-04-05 MED ORDER — METFORMIN HCL 500 MG PO TABS: 500.0000 mg | ORAL_TABLET | Freq: Two times a day (BID) | ORAL | 1 refills | Status: DC

## 2022-04-08 ENCOUNTER — Ambulatory Visit (INDEPENDENT_AMBULATORY_CARE_PROVIDER_SITE_OTHER): Payer: Medicare PPO | Admitting: *Deleted

## 2022-04-08 DIAGNOSIS — E538 Deficiency of other specified B group vitamins: Secondary | ICD-10-CM | POA: Diagnosis not present

## 2022-04-08 MED ORDER — CYANOCOBALAMIN 1000 MCG/ML IJ SOLN
1000.0000 ug | Freq: Once | INTRAMUSCULAR | Status: AC
  Administered 2022-04-08: 1000 ug via INTRAMUSCULAR

## 2022-04-08 NOTE — Progress Notes (Signed)
Pls cosign for B12 inj../lmb  

## 2022-04-15 ENCOUNTER — Ambulatory Visit (INDEPENDENT_AMBULATORY_CARE_PROVIDER_SITE_OTHER): Payer: Medicare PPO

## 2022-04-15 DIAGNOSIS — E538 Deficiency of other specified B group vitamins: Secondary | ICD-10-CM

## 2022-04-15 MED ORDER — CYANOCOBALAMIN 1000 MCG/ML IJ SOLN
1000.0000 ug | Freq: Once | INTRAMUSCULAR | Status: AC
  Administered 2022-04-15: 1000 ug via INTRAMUSCULAR

## 2022-04-15 NOTE — Progress Notes (Signed)
After obtaining consent, and per orders of Dr. Ronnald Ramp, injection of B12 given by Marrian Salvage. Patient instructed report any adverse reaction to me immediately.

## 2022-04-20 ENCOUNTER — Other Ambulatory Visit: Payer: Self-pay | Admitting: Internal Medicine

## 2022-04-20 DIAGNOSIS — E119 Type 2 diabetes mellitus without complications: Secondary | ICD-10-CM

## 2022-04-24 ENCOUNTER — Ambulatory Visit: Payer: Medicare PPO | Admitting: Internal Medicine

## 2022-04-25 ENCOUNTER — Ambulatory Visit (INDEPENDENT_AMBULATORY_CARE_PROVIDER_SITE_OTHER): Payer: Medicare PPO

## 2022-04-25 DIAGNOSIS — E538 Deficiency of other specified B group vitamins: Secondary | ICD-10-CM

## 2022-04-25 MED ORDER — CYANOCOBALAMIN 1000 MCG/ML IJ SOLN
1000.0000 ug | Freq: Once | INTRAMUSCULAR | Status: AC
  Administered 2022-04-25: 1000 ug via INTRAMUSCULAR

## 2022-04-25 NOTE — Progress Notes (Signed)
Pt was given B12 injection with no complication.

## 2022-05-03 ENCOUNTER — Ambulatory Visit (INDEPENDENT_AMBULATORY_CARE_PROVIDER_SITE_OTHER): Payer: Medicare PPO | Admitting: Radiology

## 2022-05-03 DIAGNOSIS — E538 Deficiency of other specified B group vitamins: Secondary | ICD-10-CM

## 2022-05-03 MED ORDER — CYANOCOBALAMIN 1000 MCG/ML IJ SOLN
1000.0000 ug | Freq: Once | INTRAMUSCULAR | Status: AC
Start: 2022-05-03 — End: 2022-05-03
  Administered 2022-05-03: 1000 ug via INTRAMUSCULAR

## 2022-05-03 NOTE — Progress Notes (Signed)
Pt here rec'd 4th b12 and tolerated well.

## 2022-06-03 ENCOUNTER — Ambulatory Visit (INDEPENDENT_AMBULATORY_CARE_PROVIDER_SITE_OTHER): Payer: Medicare PPO

## 2022-06-03 DIAGNOSIS — E538 Deficiency of other specified B group vitamins: Secondary | ICD-10-CM | POA: Diagnosis not present

## 2022-06-03 MED ORDER — CYANOCOBALAMIN 1000 MCG/ML IJ SOLN
1000.0000 ug | Freq: Once | INTRAMUSCULAR | Status: AC
Start: 2022-06-03 — End: 2022-06-03
  Administered 2022-06-03: 1000 ug via INTRAMUSCULAR

## 2022-06-03 NOTE — Progress Notes (Signed)
After obtaining consent, and per orders of Dr. Jones, injection of B12 given by Anuel Sitter P Noell Shular. Patient instructed to report any adverse reaction to me immediately.  

## 2022-06-17 DIAGNOSIS — E119 Type 2 diabetes mellitus without complications: Secondary | ICD-10-CM | POA: Diagnosis not present

## 2022-06-17 DIAGNOSIS — H04123 Dry eye syndrome of bilateral lacrimal glands: Secondary | ICD-10-CM | POA: Diagnosis not present

## 2022-06-17 LAB — HM DIABETES EYE EXAM

## 2022-07-04 ENCOUNTER — Ambulatory Visit: Payer: Medicare PPO

## 2022-07-08 ENCOUNTER — Ambulatory Visit (INDEPENDENT_AMBULATORY_CARE_PROVIDER_SITE_OTHER): Payer: Medicare PPO | Admitting: *Deleted

## 2022-07-08 DIAGNOSIS — E538 Deficiency of other specified B group vitamins: Secondary | ICD-10-CM | POA: Diagnosis not present

## 2022-07-08 MED ORDER — CYANOCOBALAMIN 1000 MCG/ML IJ SOLN
1000.0000 ug | Freq: Once | INTRAMUSCULAR | Status: AC
Start: 2022-07-08 — End: 2022-07-08
  Administered 2022-07-08: 1000 ug via INTRAMUSCULAR

## 2022-07-08 NOTE — Progress Notes (Signed)
Pls cosign for B12 inj../lmb  

## 2022-07-24 ENCOUNTER — Ambulatory Visit (INDEPENDENT_AMBULATORY_CARE_PROVIDER_SITE_OTHER): Payer: Medicare PPO

## 2022-07-24 VITALS — Ht 71.0 in | Wt 230.0 lb

## 2022-07-24 DIAGNOSIS — Z0000 Encounter for general adult medical examination without abnormal findings: Secondary | ICD-10-CM

## 2022-07-24 NOTE — Progress Notes (Signed)
Subjective:   Victor Christensen is a 52 y.o. male who presents for Medicare Annual/Subsequent preventive examination.  Visit Complete: Virtual  I connected with  Victor Christensen on 07/24/22 by a audio enabled telemedicine application and verified that I am speaking with the correct person using two identifiers.  Patient Location: Home  Provider Location: Office/Clinic  I discussed the limitations of evaluation and management by telemedicine. The patient expressed understanding and agreed to proceed.  Review of Systems     Cardiac Risk Factors include: advanced age (>43men, >73 women);diabetes mellitus;dyslipidemia;family history of premature cardiovascular disease;male gender;obesity (BMI >30kg/m2);sedentary lifestyle     Objective:    Today's Vitals   07/24/22 1517  Weight: 230 lb (104.3 kg)  Height: 5\' 11"  (1.803 m)  PainSc: 0-No pain   Body mass index is 32.08 kg/m.     07/24/2022    3:20 PM 09/10/2021    7:31 AM 07/23/2021    8:59 AM 07/10/2021    1:00 AM 07/05/2019   10:10 AM 02/05/2018    8:17 AM 02/02/2018    6:11 PM  Advanced Directives  Does Patient Have a Medical Advance Directive? No No No No No No No  Would patient like information on creating a medical advance directive? No - Patient declined No - Patient declined  No - Patient declined Yes (ED - Information included in AVS) No - Patient declined     Current Medications (verified) Outpatient Encounter Medications as of 07/24/2022  Medication Sig   atorvastatin (LIPITOR) 10 MG tablet Take 1 tablet (10 mg total) by mouth daily.   Blood Glucose Monitoring Suppl (CONTOUR NEXT GEN MONITOR) w/Device KIT 1 Act by Does not apply route 2 (two) times daily.   Cholecalciferol (VITAMIN D3 PO) Take 2,000 Units by mouth daily.   cyanocobalamin (VITAMIN B12) 1000 MCG tablet Take 1 tablet (1,000 mcg total) by mouth daily.   folic acid (FOLVITE) 1 MG tablet Take 1 tablet (1 mg total) by mouth daily.   glucose blood test strip 1 each by  Other route 2 (two) times daily. Use as instructed   metFORMIN (GLUCOPHAGE) 500 MG tablet Take 1 tablet (500 mg total) by mouth 2 (two) times daily with a meal.   tamsulosin (FLOMAX) 0.4 MG CAPS capsule Take 0.4 mg by mouth at bedtime.   traMADol (ULTRAM) 50 MG tablet Take 1-2 tablets (50-100 mg total) by mouth every 6 (six) hours as needed for moderate pain or severe pain (after lithotripsy).   No facility-administered encounter medications on file as of 07/24/2022.    Allergies (verified) Duloxetine and Oxycodone-acetaminophen   History: Past Medical History:  Diagnosis Date   Anxiety    Diabetes mellitus without complication (HCC)    type 2    History of kidney stones    Neuromyelitis optica (HCC)    Paraplegia (HCC) 2014   per Patient   Past Surgical History:  Procedure Laterality Date   CYSTOSCOPY W/ URETERAL STENT PLACEMENT Left 07/09/2021   Procedure: CYSTOSCOPY WITH RETROGRADE PYELOGRAM/URETERAL STENT PLACEMENT;  Surgeon: Despina Arias, MD;  Location: [facility];  Service: Urology;  Laterality: Left;   CYSTOSCOPY WITH STENT PLACEMENT Left 01/15/2018   Procedure: CYSTOSCOPY WITH STENT PLACEMENT retrograde pylegram;  Surgeon: Marcine Matar, MD;  Location: [facility];  Service: Urology;  Laterality: Left;   CYSTOSCOPY/URETEROSCOPY/HOLMIUM LASER/STENT PLACEMENT Left 02/05/2018   Procedure: CYSTOSCOPY/URETEROSCOPY/HOLMIUM LASER/STENT EXTRACTION WITH LEFT STENT PLACEMENT;  Surgeon: Marcine Matar, MD;  Location: [facility];  Service: Urology;  Laterality: Left;  CYSTOSCOPY/URETEROSCOPY/HOLMIUM LASER/STENT PLACEMENT Left 07/24/2021   Procedure: LEFT URETEROSCOPY/HOLMIUM LASER/STENT PLACEMENT;  Surgeon: Despina Arias, MD;  Location: [facility];  Service: Urology;  Laterality: Left;  90 MINUTES   EXTRACORPOREAL SHOCK WAVE LITHOTRIPSY Right 09/10/2021   Procedure: EXTRACORPOREAL SHOCK WAVE LITHOTRIPSY (ESWL);  Surgeon: Sebastian Ache, MD;  Location: [facility];  Service:  Urology;  Laterality: Right;   Family History  Problem Relation Age of Onset   Hypertension Mother    Hypertension Father    Diabetes Father    Social History   Socioeconomic History   Marital status: Married    Spouse name: Not on file   Number of children: 2   Years of education: college   Highest education level: Bachelor's degree (e.g., BA, AB, BS)  Occupational History   Occupation: Disabled  Tobacco Use   Smoking status: Never   Smokeless tobacco: Never  Vaping Use   Vaping Use: Never used  Substance and Sexual Activity   Alcohol use: Not Currently    Comment: social   Drug use: Never   Sexual activity: Not Currently  Other Topics Concern   Not on file  Social History Narrative   Lives at home with his wife and son.   Right-handed.   Moderate caffeine use.   Bachelor's Degree   Social Determinants of Health   Financial Resource Strain: Low Risk  (07/24/2022)   Overall Financial Resource Strain (CARDIA)    Difficulty of Paying Living Expenses: Not hard at all  Food Insecurity: No Food Insecurity (07/24/2022)   Hunger Vital Sign    Worried About Running Out of Food in the Last Year: Never true    Ran Out of Food in the Last Year: Never true  Transportation Needs: No Transportation Needs (07/24/2022)   PRAPARE - Administrator, Civil Service (Medical): No    Lack of Transportation (Non-Medical): No  Physical Activity: Insufficiently Active (07/24/2022)   Exercise Vital Sign    Days of Exercise per Week: 3 days    Minutes of Exercise per Session: 30 min  Stress: Stress Concern Present (07/24/2022)   [employer] of Occupational Health - Occupational Stress Questionnaire    Feeling of Stress : To some extent  Social Connections: Unknown (07/24/2022)   Social Connection and Isolation Panel [NHANES]    Frequency of Communication with Friends and Family: Three times a week    Frequency of Social Gatherings with Friends and Family: Twice a week     Attends Religious Services: Patient declined    Database administrator or Organizations: Patient declined    Attends Engineer, structural: Patient declined    Marital Status: Married    Tobacco Counseling Counseling given: Not Answered   Clinical Intake:  Pre-visit preparation completed: Yes  Pain : No/denies pain Pain Score: 0-No pain     BMI - recorded: 32.08 Nutritional Status: BMI > 30  Obese Nutritional Risks: None Diabetes: Yes CBG done?: No Did pt. bring in CBG monitor from home?: No  How often do you need to have someone help you when you read instructions, pamphlets, or other written materials from your doctor or pharmacy?: 1 - Never What is the last grade level you completed in school?: [employer]  Interpreter Needed?: No  Information entered by :: Daaiyah Baumert N. Adlee Paar, LPN.   Activities of Daily Living    07/24/2022    3:23 PM 09/10/2021    7:37 AM  In your present state  of health, do you have any difficulty performing the following activities:  Hearing? 0 0  Vision? 0 0  Difficulty concentrating or making decisions? 0 0  Walking or climbing stairs? 1 1  Dressing or bathing? 0 0  Doing errands, shopping? 0   Preparing Food and eating ? N   Using the Toilet? N   In the past six months, have you accidently leaked urine? N   Do you have problems with loss of bowel control? N   Managing your Medications? N   Managing your Finances? N   Housekeeping or managing your Housekeeping? N     Patient Care Team: Etta Grandchild, MD as PCP - General (Internal Medicine)  Indicate any recent Medical Services you may have received from other than [facility] providers in the past year (date may be approximate).     Assessment:   This is a routine wellness examination for Dionis.  Hearing/Vision screen Hearing Screening - Comments:: Denies hearing difficulties.  Vision Screening - Comments:: Wears rx glasses - up to date with routine eye exams with  [facility]   Dietary issues and exercise activities discussed:     Goals Addressed             This Visit's Progress    My goal for 2024 is to have more mobility and strengthening of the body.        Depression Screen    07/24/2022    3:22 PM 04/04/2022    8:48 AM 04/05/2021   10:45 AM 04/04/2020    1:05 PM 01/06/2020   11:33 AM 10/06/2019    1:18 PM 07/05/2019   10:12 AM  PHQ 2/9 Scores  PHQ - 2 Score 0 0 0 0 0 0 0  PHQ- 9 Score 1          Fall Risk    07/24/2022    3:21 PM 04/04/2022    8:48 AM 04/04/2020    1:05 PM 01/06/2020   11:33 AM 10/06/2019    1:18 PM  Fall Risk   Falls in the past year? 0 0 0 0 0  Number falls in past yr: 0 0 0 0 0  Injury with Fall? 0 0 0 0 0  Risk for fall due to : No Fall Risks No Fall Risks  No Fall Risks   Follow up Falls prevention discussed Falls evaluation completed Falls evaluation completed Falls evaluation completed Falls evaluation completed    MEDICARE RISK AT HOME:  Medicare Risk at Home - 07/24/22 1520     Any stairs in or around the home? No    If so, are there any without handrails? No    Home free of loose throw rugs in walkways, pet beds, electrical cords, etc? Yes    Adequate lighting in your home to reduce risk of falls? Yes    Life alert? No    Use of a cane, walker or w/c? Yes    Grab bars in the bathroom? Yes    Shower chair or bench in shower? Yes    Elevated toilet seat or a handicapped toilet? Yes             TIMED UP AND GO:  Was the test performed?  No    Cognitive Function:        07/24/2022    3:23 PM 07/05/2019   10:10 AM  6CIT Screen  What Year? 0 points 0 points  What month? 0 points 0  points  What time? 0 points 0 points  Count back from 20 0 points 0 points  Months in reverse 0 points 0 points  Repeat phrase 0 points 0 points  Total Score 0 points 0 points    Immunizations Immunization History  Administered Date(s) Administered   PFIZER(Purple Top)SARS-COV-2 Vaccination 04/29/2019,  05/20/2019, 01/25/2020   PNEUMOCOCCAL CONJUGATE-20 04/05/2021   Tdap 04/11/2021    TDAP status: Declined, Education has been provided regarding the importance of this vaccine but patient still declined. Advised may receive this vaccine at local pharmacy or Health Dept. Aware to provide a copy of the vaccination record if obtained from local pharmacy or Health Dept. Verbalized acceptance and understanding.  Flu Vaccine status: Declined, Education has been provided regarding the importance of this vaccine but patient still declined. Advised may receive this vaccine at local pharmacy or Health Dept. Aware to provide a copy of the vaccination record if obtained from local pharmacy or Health Dept. Verbalized acceptance and understanding.  Pneumococcal vaccine status: Up to date  Covid-19 vaccine status: Completed vaccines  Qualifies for Shingles Vaccine? Yes   Zostavax completed No   Shingrix Completed?: No.    Education has been provided regarding the importance of this vaccine. Patient has been advised to call insurance company to determine out of pocket expense if they have not yet received this vaccine. Advised may also receive vaccine at local pharmacy or Health Dept. Verbalized acceptance and understanding.  Screening Tests Health Maintenance  Topic Date Due   Zoster Vaccines- Shingrix (1 of 2) Never done   INFLUENZA VACCINE  08/29/2022   HEMOGLOBIN A1C  10/05/2022   Diabetic kidney evaluation - eGFR measurement  04/04/2023   Diabetic kidney evaluation - Urine ACR  04/04/2023   FOOT EXAM  04/04/2023   OPHTHALMOLOGY EXAM  06/17/2023   Medicare Annual Wellness (AWV)  07/24/2023   Fecal DNA (Cologuard)  04/19/2024   DTaP/Tdap/Td (2 - Td or Tdap) 04/12/2031   Hepatitis C Screening  Completed   HIV Screening  Completed   HPV VACCINES  Aged Out   COVID-19 Vaccine  Discontinued    Health Maintenance  Health Maintenance Due  Topic Date Due   Zoster Vaccines- Shingrix (1 of 2) Never  done    Colorectal cancer screening: Type of screening: Cologuard. Completed 04/29/2021. Repeat every 3 years  Lung Cancer Screening: (Low Dose CT Chest recommended if Age 56-80 years, 20 pack-year currently smoking OR have quit w/in 15years.) does not qualify.   Lung Cancer Screening Referral: no  Additional Screening:  Hepatitis C Screening: does qualify; Completed 04/05/2021  Vision Screening: Recommended annual ophthalmology exams for early detection of glaucoma and other disorders of the eye. Is the patient up to date with their annual eye exam?  Yes  Who is the provider or what is the name of the office in which the patient attends annual eye exams? [facility] If pt is not established with a provider, would they like to be referred to a provider to establish care? No .   Dental Screening: Recommended annual dental exams for proper oral hygiene  Diabetic Foot Exam: Diabetic Foot Exam: Completed 04/04/2022  Community Resource Referral / Chronic Care Management: CRR required this visit?  No   CCM required this visit?  No     Plan:     I have personally reviewed and noted the following in the patient's chart:   Medical and social history Use of alcohol, tobacco or illicit drugs  Current medications and supplements including opioid prescriptions. Patient is not currently taking opioid prescriptions. Functional ability and status Nutritional status Physical activity Advanced directives List of other physicians Hospitalizations, surgeries, and ER visits in previous 12 months Vitals Screenings to include cognitive, depression, and falls Referrals and appointments  In addition, I have reviewed and discussed with patient certain preventive protocols, quality metrics, and best practice recommendations. A written personalized care plan for preventive services as well as general preventive health recommendations were provided to patient.     Mickeal Needy,  LPN   [id number]   After Visit Summary: (Mail) Due to this being a telephonic visit, the after visit summary with patients personalized plan was offered to patient via mail   Nurse Notes: Normal cognitive status assessed by direct observation via telephone conversation by this Nurse Health Advisor. No abnormalities found.

## 2022-07-24 NOTE — Patient Instructions (Addendum)
Mr. Victor Christensen , Thank you for taking time to come for your Medicare Wellness Visit. I appreciate your ongoing commitment to your health goals. Please review the following plan we discussed and let me know if I can assist you in the future.   These are the goals we discussed:  Goals      My goal for 2024 is to have more mobility and strengthening of the body.        This is a list of the screening recommended for you and due dates:  Health Maintenance  Topic Date Due   Zoster (Shingles) Vaccine (1 of 2) Never done   Flu Shot  08/29/2022   Hemoglobin A1C  10/05/2022   Yearly kidney function blood test for diabetes  04/04/2023   Yearly kidney health urinalysis for diabetes  04/04/2023   Complete foot exam   04/04/2023   Eye exam for diabetics  06/17/2023   Medicare Annual Wellness Visit  07/24/2023   Cologuard (Stool DNA test)  04/19/2024   DTaP/Tdap/Td vaccine (2 - Td or Tdap) 04/12/2031   Hepatitis C Screening  Completed   HIV Screening  Completed   HPV Vaccine  Aged Out   COVID-19 Vaccine  Discontinued    Advanced directives: No; Advance directive discussed with you today. Even though you declined this today please call our office should you change your mind and we can give you the proper paperwork for you to fill out.  Conditions/risks identified: Yes; Type II Diabetes  Next appointment: It was nice speaking with you today!  Please follow up in one year for your annual wellness visit via telephone call with Nurse Percell Miller on 07/31/2023 at 1:00 p.m.  If you need to cancel or reschedule please call [phone].  Preventive Care 40-64 Years, Male Preventive care refers to lifestyle choices and visits with your health care provider that can promote health and wellness. What does preventive care include? A yearly physical exam. This is also called an annual well check. Dental exams once or twice a year. Routine eye exams. Ask your health care provider how often you should have your  eyes checked. Personal lifestyle choices, including: Daily care of your teeth and gums. Regular physical activity. Eating a healthy diet. Avoiding tobacco and drug use. Limiting alcohol use. Practicing safe sex. Taking low-dose aspirin every day starting at age 12. What happens during an annual well check? The services and screenings done by your health care provider during your annual well check will depend on your age, overall health, lifestyle risk factors, and family history of disease. Counseling  Your health care provider may ask you questions about your: Alcohol use. Tobacco use. Drug use. Emotional well-being. Home and relationship well-being. Sexual activity. Eating habits. Work and work Astronomer. Screening  You may have the following tests or measurements: Height, weight, and BMI. Blood pressure. Lipid and cholesterol levels. These may be checked every 5 years, or more frequently if you are over 62 years old. Skin check. Lung cancer screening. You may have this screening every year starting at age 40 if you have a 30-pack-year history of smoking and currently smoke or have quit within the past 15 years. Fecal occult blood test (FOBT) of the stool. You may have this test every year starting at age 68. Flexible sigmoidoscopy or colonoscopy. You may have a sigmoidoscopy every 5 years or a colonoscopy every 10 years starting at age 67. Prostate cancer screening. Recommendations will vary depending on your family history and  other risks. Hepatitis C blood test. Hepatitis B blood test. Sexually transmitted disease (STD) testing. Diabetes screening. This is done by checking your blood sugar (glucose) after you have not eaten for a while (fasting). You may have this done every 1-3 years. Discuss your test results, treatment options, and if necessary, the need for more tests with your health care provider. Vaccines  Your health care provider may recommend certain vaccines,  such as: Influenza vaccine. This is recommended every year. Tetanus, diphtheria, and acellular pertussis (Tdap, Td) vaccine. You may need a Td booster every 10 years. Zoster vaccine. You may need this after age 78. Pneumococcal 13-valent conjugate (PCV13) vaccine. You may need this if you have certain conditions and have not been vaccinated. Pneumococcal polysaccharide (PPSV23) vaccine. You may need one or two doses if you smoke cigarettes or if you have certain conditions. Talk to your health care provider about which screenings and vaccines you need and how often you need them. This information is not intended to replace advice given to you by your health care provider. Make sure you discuss any questions you have with your health care provider. Document Released: 02/10/2015 Document Revised: 10/04/2015 Document Reviewed: 11/15/2014 Elsevier Interactive Patient Education  2017 [employer].  Fall Prevention in the Home Falls can cause injuries. They can happen to people of all ages. There are many things you can do to make your home safe and to help prevent falls. What can I do on the outside of my home? Regularly fix the edges of walkways and driveways and fix any cracks. Remove anything that might make you trip as you walk through a door, such as a raised step or threshold. Trim any bushes or trees on the path to your home. Use bright outdoor lighting. Clear any walking paths of anything that might make someone trip, such as rocks or tools. Regularly check to see if handrails are loose or broken. Make sure that both sides of any steps have handrails. Any raised decks and porches should have guardrails on the edges. Have any leaves, snow, or ice cleared regularly. Use sand or salt on walking paths during winter. Clean up any spills in your garage right away. This includes oil or grease spills. What can I do in the bathroom? Use night lights. Install grab bars by the toilet and in the  tub and shower. Do not use towel bars as grab bars. Use non-skid mats or decals in the tub or shower. If you need to sit down in the shower, use a plastic, non-slip stool. Keep the floor dry. Clean up any water that spills on the floor as soon as it happens. Remove soap buildup in the tub or shower regularly. Attach bath mats securely with double-sided non-slip rug tape. Do not have throw rugs and other things on the floor that can make you trip. What can I do in the bedroom? Use night lights. Make sure that you have a light by your bed that is easy to reach. Do not use any sheets or blankets that are too big for your bed. They should not hang down onto the floor. Have a firm chair that has side arms. You can use this for support while you get dressed. Do not have throw rugs and other things on the floor that can make you trip. What can I do in the kitchen? Clean up any spills right away. Avoid walking on wet floors. Keep items that you use a lot in easy-to-reach places.  If you need to reach something above you, use a strong step stool that has a grab bar. Keep electrical cords out of the way. Do not use floor polish or wax that makes floors slippery. If you must use wax, use non-skid floor wax. Do not have throw rugs and other things on the floor that can make you trip. What can I do with my stairs? Do not leave any items on the stairs. Make sure that there are handrails on both sides of the stairs and use them. Fix handrails that are broken or loose. Make sure that handrails are as long as the stairways. Check any carpeting to make sure that it is firmly attached to the stairs. Fix any carpet that is loose or worn. Avoid having throw rugs at the top or bottom of the stairs. If you do have throw rugs, attach them to the floor with carpet tape. Make sure that you have a light switch at the top of the stairs and the bottom of the stairs. If you do not have them, ask someone to add them for  you. What else can I do to help prevent falls? Wear shoes that: Do not have high heels. Have rubber bottoms. Are comfortable and fit you well. Are closed at the toe. Do not wear sandals. If you use a stepladder: Make sure that it is fully opened. Do not climb a closed stepladder. Make sure that both sides of the stepladder are locked into place. Ask someone to hold it for you, if possible. Clearly mark and make sure that you can see: Any grab bars or handrails. First and last steps. Where the edge of each step is. Use tools that help you move around (mobility aids) if they are needed. These include: Canes. Walkers. Scooters. Crutches. Turn on the lights when you go into a dark area. Replace any light bulbs as soon as they burn out. Set up your furniture so you have a clear path. Avoid moving your furniture around. If any of your floors are uneven, fix them. If there are any pets around you, be aware of where they are. Review your medicines with your doctor. Some medicines can make you feel dizzy. This can increase your chance of falling. Ask your doctor what other things that you can do to help prevent falls. This information is not intended to replace advice given to you by your health care provider. Make sure you discuss any questions you have with your health care provider. Document Released: 11/10/2008 Document Revised: 06/22/2015 Document Reviewed: 02/18/2014 Elsevier Interactive Patient Education  2017 [employer].

## 2022-08-02 ENCOUNTER — Other Ambulatory Visit: Payer: Self-pay | Admitting: Internal Medicine

## 2022-08-02 DIAGNOSIS — E785 Hyperlipidemia, unspecified: Secondary | ICD-10-CM

## 2022-08-02 MED ORDER — ATORVASTATIN CALCIUM 10 MG PO TABS: 10.0000 mg | ORAL_TABLET | Freq: Every day | ORAL | 1 refills | Status: DC

## 2022-08-12 ENCOUNTER — Encounter: Payer: Self-pay | Admitting: Internal Medicine

## 2022-08-12 ENCOUNTER — Ambulatory Visit: Payer: Medicare PPO | Admitting: Internal Medicine

## 2022-08-12 ENCOUNTER — Ambulatory Visit: Payer: Medicare PPO

## 2022-08-12 VITALS — BP 112/76 | HR 85 | Temp 97.9°F | Ht 71.0 in

## 2022-08-12 DIAGNOSIS — E538 Deficiency of other specified B group vitamins: Secondary | ICD-10-CM | POA: Diagnosis not present

## 2022-08-12 DIAGNOSIS — E119 Type 2 diabetes mellitus without complications: Secondary | ICD-10-CM

## 2022-08-12 DIAGNOSIS — D700 Congenital agranulocytosis: Secondary | ICD-10-CM | POA: Diagnosis not present

## 2022-08-12 LAB — CBC WITH DIFFERENTIAL/PLATELET
Basophils Absolute: 0 10*3/uL (ref 0.0–0.1)
Basophils Relative: 0.6 % (ref 0.0–3.0)
Eosinophils Absolute: 0.1 10*3/uL (ref 0.0–0.7)
Eosinophils Relative: 4.3 % (ref 0.0–5.0)
HCT: 39.2 % (ref 39.0–52.0)
Hemoglobin: 12.6 g/dL — ABNORMAL LOW (ref 13.0–17.0)
Lymphocytes Relative: 25.4 % (ref 12.0–46.0)
Lymphs Abs: 0.8 10*3/uL (ref 0.7–4.0)
MCHC: 32.2 g/dL (ref 30.0–36.0)
MCV: 77.8 fl — ABNORMAL LOW (ref 78.0–100.0)
Monocytes Absolute: 0.5 10*3/uL (ref 0.1–1.0)
Monocytes Relative: 15.7 % — ABNORMAL HIGH (ref 3.0–12.0)
Neutro Abs: 1.6 10*3/uL (ref 1.4–7.7)
Neutrophils Relative %: 54 % (ref 43.0–77.0)
Platelets: 237 10*3/uL (ref 150.0–400.0)
RBC: 5.04 Mil/uL (ref 4.22–5.81)
RDW: 16.4 % — ABNORMAL HIGH (ref 11.5–15.5)
WBC: 3 10*3/uL — ABNORMAL LOW (ref 4.0–10.5)

## 2022-08-12 LAB — HEMOGLOBIN A1C: Hgb A1c MFr Bld: 7.4 % — ABNORMAL HIGH (ref 4.6–6.5)

## 2022-08-12 MED ORDER — METFORMIN HCL ER 750 MG PO TB24
750.0000 mg | ORAL_TABLET | Freq: Every day | ORAL | 1 refills | Status: DC
Start: 2022-08-12 — End: 2023-03-05

## 2022-08-12 MED ORDER — CYANOCOBALAMIN 2000 MCG PO TABS: 2000.0000 ug | ORAL_TABLET | Freq: Every day | ORAL | 1 refills | Status: DC

## 2022-08-12 NOTE — Progress Notes (Unsigned)
Subjective:  Patient ID: Victor Christensen, male    DOB: October 27, 1970  Age: 52 y.o. MRN: [id number]  CC: Diabetes and Anemia   HPI Victor Christensen presents for f/up ---  Outpatient Medications Prior to Visit  Medication Sig Dispense Refill   atorvastatin (LIPITOR) 10 MG tablet Take 1 tablet (10 mg total) by mouth daily. 90 tablet 1   Blood Glucose Monitoring Suppl (CONTOUR NEXT GEN MONITOR) w/Device KIT 1 Act by Does not apply route 2 (two) times daily. 2 kit 0   Cholecalciferol (VITAMIN D3 PO) Take 2,000 Units by mouth daily.     folic acid (FOLVITE) 1 MG tablet Take 1 tablet (1 mg total) by mouth daily. 90 tablet 1   glucose blood test strip 1 each by Other route 2 (two) times daily. Use as instructed 100 each 5   tamsulosin (FLOMAX) 0.4 MG CAPS capsule Take 0.4 mg by mouth at bedtime.     traMADol (ULTRAM) 50 MG tablet Take 1-2 tablets (50-100 mg total) by mouth every 6 (six) hours as needed for moderate pain or severe pain (after lithotripsy). 20 tablet 0   cyanocobalamin (VITAMIN B12) 1000 MCG tablet Take 1 tablet (1,000 mcg total) by mouth daily. 90 tablet 1   metFORMIN (GLUCOPHAGE) 500 MG tablet Take 1 tablet (500 mg total) by mouth 2 (two) times daily with a meal. 180 tablet 1   No facility-administered medications prior to visit.    ROS Review of Systems  Objective:  BP 112/76 (BP Location: Right Arm, Patient Position: Sitting, Cuff Size: Large)   Pulse 85   Temp 97.9 F (36.6 C) (Oral)   Ht 5\' 11"  (1.803 m)   SpO2 96%   BMI 32.08 kg/m   BP Readings from Last 3 Encounters:  08/12/22 112/76  04/04/22 128/84  03/04/22 120/84    Wt Readings from Last 3 Encounters:  07/24/22 230 lb (104.3 kg)  11/06/21 191 lb 11.2 oz (87 kg)  09/10/21 200 lb (90.7 kg)    Physical Exam  Lab Results  Component Value Date   WBC 3.0 (L) 08/12/2022   HGB 12.6 (L) 08/12/2022   HCT 39.2 08/12/2022   PLT 237.0 08/12/2022   GLUCOSE 99 04/04/2022   CHOL 121 04/04/2022   TRIG 183.0 (H)  04/04/2022   HDL 32.90 (L) 04/04/2022   LDLCALC 51 04/04/2022   ALT 12 04/04/2022   AST 13 04/04/2022   NA 136 04/04/2022   K 4.9 04/04/2022   CL 100 04/04/2022   CREATININE 1.23 04/04/2022   BUN 16 04/04/2022   CO2 29 04/04/2022   TSH 1.52 04/04/2022   PSA 0.33 04/04/2022   HGBA1C 7.4 (H) 08/12/2022   MICROALBUR 0.9 04/04/2022    DG Abd 1 View  Result Date: 09/10/2021 CLINICAL DATA:  Preop for lithotripsy of RIGHT-sided stone. EXAM: ABDOMEN - 1 VIEW COMPARISON:  Plain film of the abdomen dated 08/28/2021. FINDINGS: 4 mm stone projecting over the lower pole of the RIGHT kidney. No LEFT-sided renal stone identified. No evidence of ureteral stone seen. Vascular phleboliths within the LEFT lower pelvis. Visualized bowel gas pattern is nonobstructive. No evidence of soft tissue mass or abnormal fluid collection. No acute-appearing osseous abnormality seen. IMPRESSION: 4 mm stone projecting over the lower pole of the RIGHT kidney. Electronically Signed   By: Bary Richard M.D.   On: 09/10/2021 07:53    Assessment & Plan:  Vitamin B12 deficiency -     CBC with Differential/Platelet; Future -  Cyanocobalamin; Take 1 tablet (2,000 mcg total) by mouth daily.  Dispense: 90 tablet; Refill: 1  Congenital neutropenia (HCC) -     CBC with Differential/Platelet; Future  Type 2 diabetes mellitus without complication, without long-term current use of insulin (HCC) -     Hemoglobin A1c; Future     Follow-up: Return in about 6 months (around 02/12/2023).  Sanda Linger, MD

## 2022-11-01 ENCOUNTER — Other Ambulatory Visit: Payer: Self-pay | Admitting: Internal Medicine

## 2022-11-01 DIAGNOSIS — E538 Deficiency of other specified B group vitamins: Secondary | ICD-10-CM

## 2022-11-07 ENCOUNTER — Inpatient Hospital Stay: Payer: Medicare PPO | Attending: Hematology and Oncology | Admitting: Hematology and Oncology

## 2022-11-07 ENCOUNTER — Inpatient Hospital Stay: Payer: Medicare PPO

## 2022-11-07 VITALS — BP 108/70 | HR 81 | Temp 97.8°F | Resp 18 | Wt 213.8 lb

## 2022-11-07 DIAGNOSIS — D709 Neutropenia, unspecified: Secondary | ICD-10-CM

## 2022-11-07 DIAGNOSIS — D72819 Decreased white blood cell count, unspecified: Secondary | ICD-10-CM | POA: Diagnosis not present

## 2022-11-07 LAB — CBC WITH DIFFERENTIAL (CANCER CENTER ONLY)
Abs Immature Granulocytes: 0.02 10*3/uL (ref 0.00–0.07)
Basophils Absolute: 0 10*3/uL (ref 0.0–0.1)
Basophils Relative: 1 %
Eosinophils Absolute: 0.2 10*3/uL (ref 0.0–0.5)
Eosinophils Relative: 6 %
HCT: 41.6 % (ref 39.0–52.0)
Hemoglobin: 12.9 g/dL — ABNORMAL LOW (ref 13.0–17.0)
Immature Granulocytes: 1 %
Lymphocytes Relative: 24 %
Lymphs Abs: 0.8 10*3/uL (ref 0.7–4.0)
MCH: 25 pg — ABNORMAL LOW (ref 26.0–34.0)
MCHC: 31 g/dL (ref 30.0–36.0)
MCV: 80.8 fL (ref 80.0–100.0)
Monocytes Absolute: 0.5 10*3/uL (ref 0.1–1.0)
Monocytes Relative: 16 %
Neutro Abs: 1.6 10*3/uL — ABNORMAL LOW (ref 1.7–7.7)
Neutrophils Relative %: 52 %
Platelet Count: 249 10*3/uL (ref 150–400)
RBC: 5.15 MIL/uL (ref 4.22–5.81)
RDW: 14.3 % (ref 11.5–15.5)
WBC Count: 3.1 10*3/uL — ABNORMAL LOW (ref 4.0–10.5)
nRBC: 0 % (ref 0.0–0.2)

## 2022-11-07 NOTE — Assessment & Plan Note (Signed)
Neutropenia started 02/05/2018.  He was previously in [place] and moved to [place] for his wife. Lab review: 05/17/2019: WBC 3, hemoglobin 13.4, ANC 1.5 (it was 1.2 on 04/19/2019) B12: 181 ANA: Negative Folic acid: 6.1, LDH: 150 09/06/2019: WBC 4.5, hemoglobin 12.6, platelets 277 03/09/19:WBC: 3.9, Hb 12.5, ANC 2.2 08/01/2020: WBC 2.1, hemoglobin 12.6, platelets 219, ANC 1 11/06/2020: WBC 2.2, hemoglobin 12.6, ANC 1.1, platelets 185 11/06/2021: WBC 2.9, hemoglobin 12.6, platelets 235, ANC 1.5 08/12/2022: WBC 3, hemoglobin 12.6, platelets 237, ANC 1.6   Labs appear to be relatively stable. (His white blood cell count appears to go up periodically especially when he had kidney stones: This is a good sign that he can increase his counts as needed)   Current treatment: B12 supplementation 1000 mcg sublingually daily Follow-up in 1 year with labs

## 2022-11-07 NOTE — Progress Notes (Signed)
Patient Care Team: Etta Grandchild, MD as PCP - General (Internal Medicine)  DIAGNOSIS:  Encounter Diagnosis  Name Primary?   Leukopenia, unspecified type Yes     CHIEF COMPLIANT:   Follow-up of leukopenia  History of Present Illness   The patient presents for an annual follow-up visit of leucopenia. He reports feeling "great" and denies any new health issues. continues to use wheel chair. He has been monitoring his white blood cell count, which was previously low but has remained stable. He denies any infections or other health concerns. The patient confirms that his medication list is accurate and there have been no changes.      ALLERGIES:  is allergic to duloxetine and oxycodone-acetaminophen.  MEDICATIONS:  Current Outpatient Medications  Medication Sig Dispense Refill   atorvastatin (LIPITOR) 10 MG tablet Take 1 tablet (10 mg total) by mouth daily. 90 tablet 1   Blood Glucose Monitoring Suppl (CONTOUR NEXT GEN MONITOR) w/Device KIT 1 Act by Does not apply route 2 (two) times daily. 2 kit 0   Cholecalciferol (VITAMIN D3 PO) Take 2,000 Units by mouth daily.     cyanocobalamin 2000 MCG tablet Take 1 tablet (2,000 mcg total) by mouth daily. 90 tablet 1   folic acid (FOLVITE) 1 MG tablet TAKE 1 TABLET(1 MG) BY MOUTH DAILY 90 tablet 0   glucose blood test strip 1 each by Other route 2 (two) times daily. Use as instructed 100 each 5   metFORMIN (GLUCOPHAGE-XR) 750 MG 24 hr tablet Take 1 tablet (750 mg total) by mouth daily with breakfast. 90 tablet 1   tamsulosin (FLOMAX) 0.4 MG CAPS capsule Take 0.4 mg by mouth at bedtime.     No current facility-administered medications for this visit.    PHYSICAL EXAMINATION: ECOG PERFORMANCE STATUS: 1 - Symptomatic but completely ambulatory  Vitals:   11/07/22 1055  BP: 108/70  Pulse: 81  Resp: 18  Temp: 97.8 F (36.6 C)  SpO2: 100%     LABORATORY DATA:  I have reviewed the data as listed    Latest Ref Rng & Units 04/04/2022     9:19 AM 07/24/2021    6:33 AM 07/09/2021    3:49 PM  CMP  Glucose 70 - 99 mg/dL 99  829  562   BUN 6 - 23 mg/dL 16  21  11    Creatinine 0.40 - 1.50 mg/dL 1.30  8.65  7.84   Sodium 135 - 145 mEq/L 136  135  136   Potassium 3.5 - 5.1 mEq/L 4.9  4.8  4.4   Chloride 96 - 112 mEq/L 100  101  101   CO2 19 - 32 mEq/L 29  24  24    Calcium 8.4 - 10.5 mg/dL 69.6  9.0  9.4   Total Protein 6.0 - 8.3 g/dL 7.5     Total Bilirubin 0.2 - 1.2 mg/dL 0.4     Alkaline Phos 39 - 117 U/L 47     AST 0 - 37 U/L 13     ALT 0 - 53 U/L 12       Lab Results  Component Value Date   WBC 3.1 (L) 11/07/2022   HGB 12.9 (L) 11/07/2022   HCT 41.6 11/07/2022   MCV 80.8 11/07/2022   PLT 249 11/07/2022   NEUTROABS 1.6 (L) 11/07/2022    ASSESSMENT & PLAN:  Leukopenia Neutropenia started 02/05/2018.  He was previously in [place] and moved to [place] for his wife. Lab review: 05/17/2019: WBC  3, hemoglobin 13.4, ANC 1.5 (it was 1.2 on 04/19/2019) B12: 181 ANA: Negative Folic acid: 6.1, LDH: 150 09/06/2019: WBC 4.5, hemoglobin 12.6, platelets 277 03/09/19:WBC: 3.9, Hb 12.5, ANC 2.2 08/01/2020: WBC 2.1, hemoglobin 12.6, platelets 219, ANC 1 11/06/2020: WBC 2.2, hemoglobin 12.6, ANC 1.1, platelets 185 11/06/2021: WBC 2.9, hemoglobin 12.6, platelets 235, ANC 1.5 08/12/2022: WBC 3, hemoglobin 12.6, platelets 237, ANC 1.6 11/07/2022: WBC 3.1, hemoglobin 12.9, ANC 1.6, platelets 249   Labs appear to be relatively stable. (His white blood cell count appears to go up periodically especially when he had kidney stones: This is a good sign that he can increase his counts as needed)   Current treatment: B12 supplementation 1000 mcg sublingually daily Follow-up in 1 year with labs         Orders Placed This Encounter  Procedures   CBC with Differential (Cancer Center Only)    Standing Status:   Future    Standing Expiration Date:   11/07/2023   The patient has a good understanding of the overall plan. he agrees with it.  he will call with any problems that may develop before the next visit here. Total time spent: 30 mins including face to face time and time spent for planning, charting and co-ordination of care   Tamsen Meek, MD 11/07/22

## 2022-11-23 ENCOUNTER — Ambulatory Visit
Admission: EM | Admit: 2022-11-23 | Discharge: 2022-11-23 | Disposition: A | Discharge status: Home / Self Care | Payer: Medicare PPO | Attending: Internal Medicine | Admitting: Internal Medicine

## 2022-11-23 ENCOUNTER — Ambulatory Visit: Payer: Medicare PPO

## 2022-11-23 DIAGNOSIS — N200 Calculus of kidney: Secondary | ICD-10-CM | POA: Insufficient documentation

## 2022-11-23 DIAGNOSIS — R1032 Left lower quadrant pain: Secondary | ICD-10-CM

## 2022-11-23 DIAGNOSIS — Z87442 Personal history of urinary calculi: Secondary | ICD-10-CM

## 2022-11-23 DIAGNOSIS — M545 Low back pain, unspecified: Secondary | ICD-10-CM | POA: Diagnosis not present

## 2022-11-23 DIAGNOSIS — R109 Unspecified abdominal pain: Secondary | ICD-10-CM | POA: Insufficient documentation

## 2022-11-23 LAB — POCT URINALYSIS DIP (MANUAL ENTRY)
Bilirubin, UA: NEGATIVE
Blood, UA: NEGATIVE
Glucose, UA: NEGATIVE mg/dL
Ketones, POC UA: NEGATIVE mg/dL
Leukocytes, UA: NEGATIVE
Nitrite, UA: NEGATIVE
Protein Ur, POC: NEGATIVE mg/dL
Spec Grav, UA: 1.015 (ref 1.010–1.025)
Urobilinogen, UA: 0.2 U/dL
pH, UA: 6 (ref 5.0–8.0)

## 2022-11-23 MED ORDER — CYCLOBENZAPRINE HCL 5 MG PO TABS: 5.0000 mg | ORAL_TABLET | Freq: Every evening | ORAL | 0 refills | Status: DC | PRN

## 2022-11-23 MED ORDER — IBUPROFEN 600 MG PO TABS
600.0000 mg | ORAL_TABLET | Freq: Four times a day (QID) | ORAL | 0 refills | Status: DC | PRN
Start: 2022-11-23 — End: 2023-08-04

## 2022-11-23 NOTE — ED Provider Notes (Signed)
[facility]  Note:  This document was prepared using Conservation officer, historic buildings and may include unintentional dictation errors.  MRN: [id number] DOB: [birth year]  Subjective:   Victor Christensen is a 52 y.o. male presenting for 3-day history of recurrent left lower flank pain.  Reports that this feels similar to the kidney stone pain he has had in the past.  He has an extensive history of renal stones, has had stent placement, shockwave lithotripsy.  Last episode was August 2023.  He does have an urologist but can follow-up with him this upcoming week as he is going out of town.  No current facility-administered medications for this encounter.  Current Outpatient Medications:    atorvastatin (LIPITOR) 10 MG tablet, Take 1 tablet (10 mg total) by mouth daily., Disp: 90 tablet, Rfl: 1   Blood Glucose Monitoring Suppl (CONTOUR NEXT GEN MONITOR) w/Device KIT, 1 Act by Does not apply route 2 (two) times daily., Disp: 2 kit, Rfl: 0   Cholecalciferol (VITAMIN D3 PO), Take 2,000 Units by mouth daily., Disp: , Rfl:    cyanocobalamin 2000 MCG tablet, Take 1 tablet (2,000 mcg total) by mouth daily., Disp: 90 tablet, Rfl: 1   folic acid (FOLVITE) 1 MG tablet, TAKE 1 TABLET(1 MG) BY MOUTH DAILY, Disp: 90 tablet, Rfl: 0   glucose blood test strip, 1 each by Other route 2 (two) times daily. Use as instructed, Disp: 100 each, Rfl: 5   metFORMIN (GLUCOPHAGE-XR) 750 MG 24 hr tablet, Take 1 tablet (750 mg total) by mouth daily with breakfast., Disp: 90 tablet, Rfl: 1   tamsulosin (FLOMAX) 0.4 MG CAPS capsule, Take 0.4 mg by mouth at bedtime., Disp: , Rfl:    Allergies  Allergen Reactions   Duloxetine Diarrhea and Other (See Comments)    insomnia   Oxycodone-Acetaminophen Other (See Comments)    Makes him loopy     Past Medical History:  Diagnosis Date   Anxiety    Diabetes mellitus without complication (HCC)    type 2    History of kidney stones    Neuromyelitis optica  (HCC)    Paraplegia (HCC) 2014   per Patient     Past Surgical History:  Procedure Laterality Date   CYSTOSCOPY W/ URETERAL STENT PLACEMENT Left 07/09/2021   Procedure: CYSTOSCOPY WITH RETROGRADE PYELOGRAM/URETERAL STENT PLACEMENT;  Surgeon: Despina Arias, MD;  Location: [facility];  Service: Urology;  Laterality: Left;   CYSTOSCOPY WITH STENT PLACEMENT Left 01/15/2018   Procedure: CYSTOSCOPY WITH STENT PLACEMENT retrograde pylegram;  Surgeon: Marcine Matar, MD;  Location: [facility];  Service: Urology;  Laterality: Left;   CYSTOSCOPY/URETEROSCOPY/HOLMIUM LASER/STENT PLACEMENT Left 02/05/2018   Procedure: CYSTOSCOPY/URETEROSCOPY/HOLMIUM LASER/STENT EXTRACTION WITH LEFT STENT PLACEMENT;  Surgeon: Marcine Matar, MD;  Location: [facility];  Service: Urology;  Laterality: Left;   CYSTOSCOPY/URETEROSCOPY/HOLMIUM LASER/STENT PLACEMENT Left 07/24/2021   Procedure: LEFT URETEROSCOPY/HOLMIUM LASER/STENT PLACEMENT;  Surgeon: Despina Arias, MD;  Location: [facility];  Service: Urology;  Laterality: Left;  90 MINUTES   EXTRACORPOREAL SHOCK WAVE LITHOTRIPSY Right 09/10/2021   Procedure: EXTRACORPOREAL SHOCK WAVE LITHOTRIPSY (ESWL);  Surgeon: Sebastian Ache, MD;  Location: [facility];  Service: Urology;  Laterality: Right;    Family History  Problem Relation Age of Onset   Hypertension Mother    Hypertension Father    Diabetes Father     Social History   Tobacco Use   Smoking status: Never   Smokeless tobacco: Never  Vaping Use   Vaping  status: Never Used  Substance Use Topics   Alcohol use: Not Currently   Drug use: Never    ROS   Objective:   Vitals: BP 113/75 (BP Location: Right Arm)   Pulse 76   Temp 97.9 F (36.6 C) (Oral)   Resp 16   SpO2 98%   Physical Exam Constitutional:      General: He is not in acute distress.    Appearance: Normal appearance. He is well-developed and normal weight. He is not ill-appearing, toxic-appearing or diaphoretic.  HENT:      Head: Normocephalic and atraumatic.     Right Ear: External ear normal.     Left Ear: External ear normal.     Nose: Nose normal.     Mouth/Throat:     Pharynx: Oropharynx is clear.  Eyes:     General: No scleral icterus.       Right eye: No discharge.        Left eye: No discharge.     Extraocular Movements: Extraocular movements intact.  Cardiovascular:     Rate and Rhythm: Normal rate.  Pulmonary:     Effort: Pulmonary effort is normal.  Abdominal:     Tenderness: There is no right CVA tenderness or left CVA tenderness.  Musculoskeletal:     Cervical back: Normal range of motion.     Lumbar back: Tenderness (across area outlined) present. No swelling, edema, deformity, signs of trauma, lacerations, spasms or bony tenderness. Normal range of motion. Negative right straight leg raise test and negative left straight leg raise test. No scoliosis.       Back:  Neurological:     Mental Status: He is alert and oriented to person, place, and time.  Psychiatric:        Mood and Affect: Mood normal.        Behavior: Behavior normal.        Thought Content: Thought content normal.        Judgment: Judgment normal.     Results for orders placed or performed during the hospital encounter of 11/23/22 (from the past 24 hour(s))  POCT urinalysis dipstick     Status: None   Collection Time: 11/23/22 11:58 AM  Result Value Ref Range   Color, UA yellow yellow   Clarity, UA clear clear   Glucose, UA negative negative mg/dL   Bilirubin, UA negative negative   Ketones, POC UA negative negative mg/dL   Spec Grav, UA 1.610 9.604 - 1.025   Blood, UA negative negative   pH, UA 6.0 5.0 - 8.0   Protein Ur, POC negative negative mg/dL   Urobilinogen, UA 0.2 0.2 or 1.0 E.U./dL   Nitrite, UA Negative Negative   Leukocytes, UA Negative Negative   DG Abd 1 View  Result Date: 11/23/2022 CLINICAL DATA:  Left flank pain for 3 days. Concern for kidney stone. EXAM: ABDOMEN - 1 VIEW COMPARISON:   09/10/2021 FINDINGS: Mild to moderate stool burden is again noted within the colon. The bowel gas pattern appears nonobstructed. Suspect cluster of tiny stones within the inferior pole of the left kidney. These measure around 4 mm. No evidence of renal calculi. Visualized osseous structures are unremarkable. IMPRESSION: 1. Suspect cluster of tiny stones within the inferior pole of the left kidney. 2. Mild to moderate stool burden within the colon. Electronically Signed   By: Signa Kell M.D.   On: 11/23/2022 12:51     Assessment and Plan :   PDMP not reviewed  this encounter.  1. Renal calculus, left   2. Left flank pain   3. Acute left-sided low back pain without sciatica   4. History of renal stone    Low suspicion for obstructive uropathy.  Patient will be managed for renal colic with ibuprofen, 80 ounces of fluids.  Maintain tamsulosin.  Follow-up with urology as soon as possible. Counseled patient on potential for adverse effects with medications prescribed/recommended today, ER and return-to-clinic precautions discussed, patient verbalized understanding.    Wallis Bamberg, PA-C 11/23/22 1340

## 2022-11-23 NOTE — ED Notes (Signed)
Pt unable to urinate-given water per request

## 2022-11-23 NOTE — ED Triage Notes (Signed)
Pt c/o left flank pain x 3 days-states feels like kidney stone pain-NAD-to tx area in own electric w/c

## 2022-11-24 LAB — URINE CULTURE

## 2022-12-02 ENCOUNTER — Other Ambulatory Visit: Payer: Self-pay

## 2022-12-02 ENCOUNTER — Encounter: Payer: Self-pay | Admitting: Emergency Medicine

## 2022-12-02 ENCOUNTER — Ambulatory Visit
Admission: EM | Admit: 2022-12-02 | Discharge: 2022-12-02 | Disposition: A | Discharge status: Home / Self Care | Payer: Medicare PPO | Attending: Internal Medicine | Admitting: Internal Medicine

## 2022-12-02 DIAGNOSIS — R300 Dysuria: Secondary | ICD-10-CM | POA: Insufficient documentation

## 2022-12-02 DIAGNOSIS — N3001 Acute cystitis with hematuria: Secondary | ICD-10-CM | POA: Insufficient documentation

## 2022-12-02 LAB — POCT URINALYSIS DIP (MANUAL ENTRY)
Bilirubin, UA: NEGATIVE
Glucose, UA: 250 mg/dL — AB
Ketones, POC UA: NEGATIVE mg/dL
Nitrite, UA: NEGATIVE
Protein Ur, POC: 30 mg/dL — AB
Spec Grav, UA: 1.02 (ref 1.010–1.025)
Urobilinogen, UA: 1 U/dL
pH, UA: 5.5 (ref 5.0–8.0)

## 2022-12-02 MED ORDER — CIPROFLOXACIN HCL 500 MG PO TABS
500.0000 mg | ORAL_TABLET | Freq: Two times a day (BID) | ORAL | 0 refills | Status: DC
Start: 2022-12-02 — End: 2023-02-20

## 2022-12-02 NOTE — Discharge Instructions (Signed)
Please start ciprofloxacin to address an urinary tract infection. Make sure you hydrate very well with plain water and a quantity of 80 ounces of water a day.  Please limit drinks that are considered urinary irritants such as soda, sweet tea, coffee, energy drinks, alcohol.  These can worsen your urinary and genital symptoms but also be the source of them.  I will let you know about your urine culture results through MyChart to see if we need to prescribe or change your antibiotics based off of those results.

## 2022-12-02 NOTE — ED Triage Notes (Signed)
Patient states he has had cloudy and malodorous urine since Saturday.

## 2022-12-02 NOTE — ED Provider Notes (Signed)
[facility]  Note:  This document was prepared using Conservation officer, historic buildings and may include unintentional dictation errors.  MRN: [id number] DOB: 11/18/1970  Subjective:   Victor Christensen is a 52 y.o. male presenting for 2-day history of acute onset cloudy malodorous urine.  Patient has concerns about a UTI, has a history of the same.  No current facility-administered medications for this encounter.  Current Outpatient Medications:    atorvastatin (LIPITOR) 10 MG tablet, Take 1 tablet (10 mg total) by mouth daily., Disp: 90 tablet, Rfl: 1   Blood Glucose Monitoring Suppl (CONTOUR NEXT GEN MONITOR) w/Device KIT, 1 Act by Does not apply route 2 (two) times daily., Disp: 2 kit, Rfl: 0   Cholecalciferol (VITAMIN D3 PO), Take 2,000 Units by mouth daily., Disp: , Rfl:    cyanocobalamin 2000 MCG tablet, Take 1 tablet (2,000 mcg total) by mouth daily., Disp: 90 tablet, Rfl: 1   cyclobenzaprine (FLEXERIL) 5 MG tablet, Take 1 tablet (5 mg total) by mouth at bedtime as needed for muscle spasms., Disp: 30 tablet, Rfl: 0   folic acid (FOLVITE) 1 MG tablet, TAKE 1 TABLET(1 MG) BY MOUTH DAILY, Disp: 90 tablet, Rfl: 0   glucose blood test strip, 1 each by Other route 2 (two) times daily. Use as instructed, Disp: 100 each, Rfl: 5   ibuprofen (ADVIL) 600 MG tablet, Take 1 tablet (600 mg total) by mouth every 6 (six) hours as needed., Disp: 30 tablet, Rfl: 0   metFORMIN (GLUCOPHAGE-XR) 750 MG 24 hr tablet, Take 1 tablet (750 mg total) by mouth daily with breakfast., Disp: 90 tablet, Rfl: 1   tamsulosin (FLOMAX) 0.4 MG CAPS capsule, Take 0.4 mg by mouth at bedtime., Disp: , Rfl:    Allergies  Allergen Reactions   Duloxetine Diarrhea and Other (See Comments)    insomnia   Oxycodone-Acetaminophen Other (See Comments)    Makes him loopy     Past Medical History:  Diagnosis Date   Anxiety    Diabetes mellitus without complication (HCC)    type 2    History of kidney  stones    Neuromyelitis optica (HCC)    Paraplegia (HCC) 2014   per Patient     Past Surgical History:  Procedure Laterality Date   CYSTOSCOPY W/ URETERAL STENT PLACEMENT Left 07/09/2021   Procedure: CYSTOSCOPY WITH RETROGRADE PYELOGRAM/URETERAL STENT PLACEMENT;  Surgeon: Despina Arias, MD;  Location: [facility];  Service: Urology;  Laterality: Left;   CYSTOSCOPY WITH STENT PLACEMENT Left 01/15/2018   Procedure: CYSTOSCOPY WITH STENT PLACEMENT retrograde pylegram;  Surgeon: Marcine Matar, MD;  Location: [facility];  Service: Urology;  Laterality: Left;   CYSTOSCOPY/URETEROSCOPY/HOLMIUM LASER/STENT PLACEMENT Left 02/05/2018   Procedure: CYSTOSCOPY/URETEROSCOPY/HOLMIUM LASER/STENT EXTRACTION WITH LEFT STENT PLACEMENT;  Surgeon: Marcine Matar, MD;  Location: [facility];  Service: Urology;  Laterality: Left;   CYSTOSCOPY/URETEROSCOPY/HOLMIUM LASER/STENT PLACEMENT Left 07/24/2021   Procedure: LEFT URETEROSCOPY/HOLMIUM LASER/STENT PLACEMENT;  Surgeon: Despina Arias, MD;  Location: [facility];  Service: Urology;  Laterality: Left;  90 MINUTES   EXTRACORPOREAL SHOCK WAVE LITHOTRIPSY Right 09/10/2021   Procedure: EXTRACORPOREAL SHOCK WAVE LITHOTRIPSY (ESWL);  Surgeon: Sebastian Ache, MD;  Location: [facility];  Service: Urology;  Laterality: Right;    Family History  Problem Relation Age of Onset   Hypertension Mother    Hypertension Father    Diabetes Father     Social History   Tobacco Use   Smoking status: Never   Smokeless tobacco:  Never  Vaping Use   Vaping status: Never Used  Substance Use Topics   Alcohol use: Not Currently   Drug use: Never    ROS   Objective:   Vitals: BP 109/72 (BP Location: Left Arm)   Pulse (!) 113   Temp 98.4 F (36.9 C) (Oral)   Resp 15   SpO2 95%   Physical Exam Constitutional:      General: He is not in acute distress.    Appearance: Normal appearance. He is well-developed and normal weight. He is not ill-appearing,  toxic-appearing or diaphoretic.  HENT:     Head: Normocephalic and atraumatic.     Right Ear: External ear normal.     Left Ear: External ear normal.     Nose: Nose normal.     Mouth/Throat:     Pharynx: Oropharynx is clear.  Eyes:     General: No scleral icterus.       Right eye: No discharge.        Left eye: No discharge.     Extraocular Movements: Extraocular movements intact.  Cardiovascular:     Rate and Rhythm: Normal rate.  Pulmonary:     Effort: Pulmonary effort is normal.  Abdominal:     Tenderness: There is no right CVA tenderness or left CVA tenderness.  Musculoskeletal:     Cervical back: Normal range of motion.  Neurological:     Mental Status: He is alert and oriented to person, place, and time.  Psychiatric:        Mood and Affect: Mood normal.        Behavior: Behavior normal.        Thought Content: Thought content normal.        Judgment: Judgment normal.     Results for orders placed or performed during the hospital encounter of 12/02/22 (from the past 24 hour(s))  POCT urinalysis dipstick     Status: Abnormal   Collection Time: 12/02/22 11:59 AM  Result Value Ref Range   Color, UA yellow yellow   Clarity, UA cloudy (A) clear   Glucose, UA =250 (A) negative mg/dL   Bilirubin, UA negative negative   Ketones, POC UA negative negative mg/dL   Spec Grav, UA 5.956 3.875 - 1.025   Blood, UA small (A) negative   pH, UA 5.5 5.0 - 8.0   Protein Ur, POC =30 (A) negative mg/dL   Urobilinogen, UA 1.0 0.2 or 1.0 E.U./dL   Nitrite, UA Negative Negative   Leukocytes, UA Moderate (2+) (A) Negative    Assessment and Plan :   PDMP not reviewed this encounter.  1. Acute cystitis with hematuria   2. Dysuria    Start ciprofloxacin to cover for acute cystitis, urine culture pending.  Recommended aggressive hydration, limiting urinary irritants. Counseled patient on potential for adverse effects with medications prescribed/recommended today, ER and  return-to-clinic precautions discussed, patient verbalized understanding.    Wallis Bamberg, [place] 12/02/22 [id number]

## 2022-12-04 LAB — URINE CULTURE: Culture: >=100000 — AB

## 2023-02-20 ENCOUNTER — Ambulatory Visit: Payer: Medicare PPO | Admitting: Internal Medicine

## 2023-02-20 ENCOUNTER — Ambulatory Visit: Payer: Medicare PPO

## 2023-02-20 ENCOUNTER — Encounter: Payer: Self-pay | Admitting: Internal Medicine

## 2023-02-20 VITALS — BP 104/80 | HR 92 | Temp 98.1°F | Resp 16 | Ht 71.0 in | Wt 215.0 lb

## 2023-02-20 DIAGNOSIS — R052 Subacute cough: Secondary | ICD-10-CM

## 2023-02-20 DIAGNOSIS — I950 Idiopathic hypotension: Secondary | ICD-10-CM

## 2023-02-20 DIAGNOSIS — R778 Other specified abnormalities of plasma proteins: Secondary | ICD-10-CM

## 2023-02-20 DIAGNOSIS — Z7984 Long term (current) use of oral hypoglycemic drugs: Secondary | ICD-10-CM | POA: Diagnosis not present

## 2023-02-20 DIAGNOSIS — E538 Deficiency of other specified B group vitamins: Secondary | ICD-10-CM

## 2023-02-20 DIAGNOSIS — E785 Hyperlipidemia, unspecified: Secondary | ICD-10-CM

## 2023-02-20 DIAGNOSIS — E119 Type 2 diabetes mellitus without complications: Secondary | ICD-10-CM | POA: Diagnosis not present

## 2023-02-20 DIAGNOSIS — R058 Other specified cough: Secondary | ICD-10-CM | POA: Diagnosis not present

## 2023-02-20 LAB — HEPATIC FUNCTION PANEL
ALT: 19 U/L (ref 0–53)
AST: 18 U/L (ref 0–37)
Albumin: 4.4 g/dL (ref 3.5–5.2)
Alkaline Phosphatase: 56 U/L (ref 39–117)
Bilirubin, Direct: 0.1 mg/dL (ref 0.0–0.3)
Total Bilirubin: 0.5 mg/dL (ref 0.2–1.2)
Total Protein: 8.5 g/dL — ABNORMAL HIGH (ref 6.0–8.3)

## 2023-02-20 LAB — VITAMIN B12: Vitamin B-12: 680 pg/mL (ref 211–911)

## 2023-02-20 LAB — CBC WITH DIFFERENTIAL/PLATELET
Basophils Absolute: 0 10*3/uL (ref 0.0–0.1)
Basophils Relative: 0.8 % (ref 0.0–3.0)
Eosinophils Absolute: 0.2 10*3/uL (ref 0.0–0.7)
Eosinophils Relative: 5.8 % — ABNORMAL HIGH (ref 0.0–5.0)
HCT: 40 % (ref 39.0–52.0)
Hemoglobin: 12.9 g/dL — ABNORMAL LOW (ref 13.0–17.0)
Lymphocytes Relative: 22.3 % (ref 12.0–46.0)
Lymphs Abs: 0.8 10*3/uL (ref 0.7–4.0)
MCHC: 32.1 g/dL (ref 30.0–36.0)
MCV: 77.7 fL — ABNORMAL LOW (ref 78.0–100.0)
Monocytes Absolute: 0.5 10*3/uL (ref 0.1–1.0)
Monocytes Relative: 13.2 % — ABNORMAL HIGH (ref 3.0–12.0)
Neutro Abs: 2.2 10*3/uL (ref 1.4–7.7)
Neutrophils Relative %: 57.9 % (ref 43.0–77.0)
Platelets: 309 10*3/uL (ref 150.0–400.0)
RBC: 5.15 Mil/uL (ref 4.22–5.81)
RDW: 16.1 % — ABNORMAL HIGH (ref 11.5–15.5)
WBC: 3.7 10*3/uL — ABNORMAL LOW (ref 4.0–10.5)

## 2023-02-20 LAB — URINALYSIS, ROUTINE W REFLEX MICROSCOPIC
Bilirubin Urine: NEGATIVE
Hgb urine dipstick: NEGATIVE
Ketones, ur: NEGATIVE
Leukocytes,Ua: NEGATIVE
Nitrite: NEGATIVE
RBC / HPF: NONE SEEN (ref 0–?)
Specific Gravity, Urine: 1.015 (ref 1.000–1.030)
Total Protein, Urine: NEGATIVE
Urine Glucose: NEGATIVE
Urobilinogen, UA: 0.2 (ref 0.0–1.0)
WBC, UA: NONE SEEN (ref 0–?)
pH: 6.5 (ref 5.0–8.0)

## 2023-02-20 LAB — LIPID PANEL
Cholesterol: 135 mg/dL (ref 0–200)
HDL: 40.1 mg/dL (ref >39.00)
LDL Cholesterol: 70 mg/dL (ref 0–99)
NonHDL: 94.42
Total CHOL/HDL Ratio: 3
Triglycerides: 124 mg/dL (ref 0.0–149.0)
VLDL: 24.8 mg/dL (ref 0.0–40.0)

## 2023-02-20 LAB — CORTISOL: Cortisol, Plasma: 10.8 ug/dL

## 2023-02-20 LAB — MICROALBUMIN / CREATININE URINE RATIO
Creatinine,U: 99.9 mg/dL
Microalb Creat Ratio: 0.7 mg/g (ref 0.0–30.0)
Microalb, Ur: <0.7 mg/dL (ref 0.0–1.9)

## 2023-02-20 LAB — FOLATE: Folate: >25.2 ng/mL (ref >5.9)

## 2023-02-20 LAB — HEMOGLOBIN A1C: Hgb A1c MFr Bld: 7.7 % — ABNORMAL HIGH (ref 4.6–6.5)

## 2023-02-20 MED ORDER — FOLIC ACID 1 MG PO TABS: 1.0000 mg | ORAL_TABLET | Freq: Every day | ORAL | 0 refills | Status: DC

## 2023-02-20 NOTE — Patient Instructions (Signed)

## 2023-02-20 NOTE — Progress Notes (Signed)
Subjective:  Patient ID: Victor Christensen, male    DOB: [birth year]  Age: 53 y.o. MRN: [id number]  CC: Anemia, Hyperlipidemia, and Diabetes   HPI Victor Christensen presents for f/up ----  Discussed the use of AI scribe software for clinical note transcription with the patient, who gave verbal consent to proceed.  History of Present Illness   The patient, with a history of diabetes and B12 deficiency, presents for a routine check-up and medication review. He denies any new symptoms, including fever, chills, night sweats, chest pain, shortness of breath, urinary symptoms, or changes in weight or appetite. He has not been taking any antibiotics and has not received a flu vaccine due to past adverse reactions.  However, he has been experiencing a cough and stuffy nose for about a week, with greenish phlegm production. He denies any associated chest pain or shortness of breath. He has been in contact with his son who recently had a cold but tested negative for flu and COVID-19.  The patient is currently taking ibuprofen and Flexeril for pain management, and B12 supplements. He had an issue with his insurance not covering a refill of his folic acid supplement. He hopes his weight has increased, but he is not actively trying to gain weight, rather maintain his current weight. His last recorded weight was 214 lbs.  He denies any symptoms of B12 deficiency or trouble swallowing. He has not been experiencing any abdominal pain.       Outpatient Medications Prior to Visit  Medication Sig Dispense Refill   atorvastatin (LIPITOR) 10 MG tablet Take 1 tablet (10 mg total) by mouth daily. 90 tablet 1   Blood Glucose Monitoring Suppl (CONTOUR NEXT GEN MONITOR) w/Device KIT 1 Act by Does not apply route 2 (two) times daily. 2 kit 0   Cholecalciferol (VITAMIN D3 PO) Take 2,000 Units by mouth daily.     cyanocobalamin 2000 MCG tablet Take 1 tablet (2,000 mcg total) by mouth daily. 90 tablet 1   cyclobenzaprine  (FLEXERIL) 5 MG tablet Take 1 tablet (5 mg total) by mouth at bedtime as needed for muscle spasms. 30 tablet 0   glucose blood test strip 1 each by Other route 2 (two) times daily. Use as instructed 100 each 5   ibuprofen (ADVIL) 600 MG tablet Take 1 tablet (600 mg total) by mouth every 6 (six) hours as needed. 30 tablet 0   metFORMIN (GLUCOPHAGE-XR) 750 MG 24 hr tablet Take 1 tablet (750 mg total) by mouth daily with breakfast. 90 tablet 1   tamsulosin (FLOMAX) 0.4 MG CAPS capsule Take 0.4 mg by mouth at bedtime.     ciprofloxacin (CIPRO) 500 MG tablet Take 1 tablet (500 mg total) by mouth 2 (two) times daily. 20 tablet 0   folic acid (FOLVITE) 1 MG tablet TAKE 1 TABLET(1 MG) BY MOUTH DAILY 90 tablet 0   No facility-administered medications prior to visit.    ROS Review of Systems  Constitutional:  Negative for diaphoresis and fatigue.  HENT: Negative.  Negative for sore throat.   Eyes: Negative.   Respiratory:  Negative for cough, chest tightness and wheezing.   Cardiovascular:  Negative for chest pain, palpitations and leg swelling.  Gastrointestinal: Negative.  Negative for abdominal pain, diarrhea, nausea and vomiting.  Genitourinary: Negative.  Negative for difficulty urinating.  Musculoskeletal:  Positive for gait problem. Negative for myalgias and neck pain.  Skin: Negative.   Neurological:  Negative for dizziness and weakness.  Hematological:  Negative for  adenopathy. Does not bruise/bleed easily.  Psychiatric/Behavioral: Negative.      Objective:  BP 104/80 (BP Location: Left Arm, Patient Position: Sitting, Cuff Size: Normal)   Pulse 92   Temp 98.1 F (36.7 C) (Oral)   Resp 16   Ht 5\' 11"  (1.803 m)   Wt 215 lb (97.5 kg)   SpO2 94%   BMI 29.99 kg/m   BP Readings from Last 3 Encounters:  02/20/23 104/80  12/02/22 109/72  11/23/22 113/75    Wt Readings from Last 3 Encounters:  02/20/23 215 lb (97.5 kg)  11/07/22 213 lb 12.8 oz (97 kg)  07/24/22 230 lb (104.3 kg)     Physical Exam Vitals reviewed.  Constitutional:      Appearance: Normal appearance.  HENT:     Mouth/Throat:     Mouth: Mucous membranes are moist.  Eyes:     General: No scleral icterus.    Conjunctiva/sclera: Conjunctivae normal.  Cardiovascular:     Rate and Rhythm: Normal rate and regular rhythm.     Heart sounds: No murmur heard. Pulmonary:     Effort: Pulmonary effort is normal.     Breath sounds: No stridor. No wheezing, rhonchi or rales.  Abdominal:     General: Abdomen is flat.     Palpations: There is no mass.     Tenderness: There is no abdominal tenderness. There is no guarding.     Hernia: No hernia is present.  Musculoskeletal:        General: Normal range of motion.     Cervical back: Neck supple.     Right lower leg: No edema.     Left lower leg: No edema.  Lymphadenopathy:     Cervical: No cervical adenopathy.  Skin:    General: Skin is warm and dry.  Neurological:     Mental Status: He is alert. Mental status is at baseline.  Psychiatric:        Mood and Affect: Mood normal.        Behavior: Behavior normal.     Lab Results  Component Value Date   WBC 3.7 (L) 02/20/2023   HGB 12.9 (L) 02/20/2023   HCT 40.0 02/20/2023   PLT 309.0 02/20/2023   GLUCOSE 99 04/04/2022   CHOL 135 02/20/2023   TRIG 124.0 02/20/2023   HDL 40.10 02/20/2023   LDLCALC 70 02/20/2023   ALT 19 02/20/2023   AST 18 02/20/2023   NA 136 04/04/2022   K 4.9 04/04/2022   CL 100 04/04/2022   CREATININE 1.23 04/04/2022   BUN 16 04/04/2022   CO2 29 04/04/2022   TSH 1.52 04/04/2022   PSA 0.33 04/04/2022   HGBA1C 7.7 (H) 02/20/2023   MICROALBUR <0.7 02/20/2023    DG Chest 2 View Result Date: 02/20/2023 CLINICAL DATA:  Productive cough. EXAM: CHEST - 2 VIEW COMPARISON:  05/03/2019 FINDINGS: The heart size and mediastinal contours are within normal limits. Bilateral upper lobe scarring again noted. No focal consolidation or pleural effusion. The visualized skeletal  structures are unremarkable. IMPRESSION: Bilateral upper lobe scarring. No acute findings. Electronically Signed   By: Danae Orleans M.D.   On: 02/20/2023 11:16     Assessment & Plan:   Subacute cough- CXR is normal. Will start antibiotics if it lasts more than 10 days. -     DG Chest 2 View; Future  Vitamin B12 deficiency -     CBC with Differential/Platelet; Future -     Folate; Future -  Vitamin B12; Future  Dyslipidemia, goal LDL below 100 -     Lipid panel; Future -     Hepatic function panel; Future  Folate deficiency -     Folic Acid; Take 1 tablet (1 mg total) by mouth daily.  Dispense: 90 tablet; Refill: 0 -     Folate; Future -     Vitamin B12; Future  Type 2 diabetes mellitus without complication, without long-term current use of insulin (HCC)- A1C is 7.7%. 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 -     Urinalysis, Routine w reflex microscopic; Future -     Cortisol; Future  Elevated total protein     Follow-up: Return in about 4 months (around 06/20/2023).  Sanda Linger, MD

## 2023-03-05 ENCOUNTER — Other Ambulatory Visit: Payer: Self-pay | Admitting: Internal Medicine

## 2023-03-05 DIAGNOSIS — E119 Type 2 diabetes mellitus without complications: Secondary | ICD-10-CM

## 2023-03-11 ENCOUNTER — Ambulatory Visit (INDEPENDENT_AMBULATORY_CARE_PROVIDER_SITE_OTHER): Payer: Medicare PPO | Admitting: Internal Medicine

## 2023-03-11 ENCOUNTER — Encounter: Payer: Self-pay | Admitting: Internal Medicine

## 2023-03-11 VITALS — BP 110/74 | HR 80 | Temp 98.3°F | Resp 16

## 2023-03-11 DIAGNOSIS — L89322 Pressure ulcer of left buttock, stage 2: Secondary | ICD-10-CM

## 2023-03-11 NOTE — Patient Instructions (Signed)
Pressure Injury  A pressure injury, also called a pressure ulcer or bedsore, is an injury to skin and the tissue under the skin that is caused by pressure. It often affects people who must spend a long time in a bed or chair because of a medical condition. Pressure injuries often occur: Over bony parts of the body, such as the tailbone, shoulders, elbows, hips, heels, spine, ankles, and back of the head. Under medical devices that touch the body. These include stockings, equipment to help with breathing, tubes, and splints. Inside the mouth or nose from dentures or tubes. Pressure injuries start as red areas on the skin and can lead to pain and an open wound. What are the causes? This condition is caused by frequent or constant pressure to an area of the body. Less blood flow to the skin can make the tissue die and break down over time, causing a wound. What increases the risk? You are more likely to develop this condition if: You are in the hospital or an extended care facility. You are bedridden or in a wheelchair. You have an injury or disease that keeps you from moving well and feeling pain or pressure. You have a condition that: Makes you sleepy or less alert. Causes poor blood flow. You need to wear a medical device. You have poor control of your bladder or bowel movements (incontinence). You are not getting enough fluid or nutrients (malnutrition). Your health care provider may recommend certain types of mattresses, mattress covers, pillows, cushions, or boots to help prevent a pressure injury. These may include products filled with air, foam, gel, or sand. What are the signs or symptoms? Symptoms of this condition depend on how severe your injury is. Symptoms may include: Red or dark areas of the skin. Pain or a change in skin texture. Your skin may feel warmer, cooler, softer, or firmer. Blisters. An open wound. How is this diagnosed? This condition is diagnosed based on a  medical history and physical exam. You may also have tests, such as: Blood tests. Imaging tests. Blood flow tests. Your injury will be staged based on how severe it is. Staging is based on: How deep the tissue injury is. This includes whether muscle, bone, tendon, or dead tissue is exposed. The cause of the injury. How is this treated? This condition may be treated by: Reducing pressure on your skin. You may need to: Change your position often. Avoid positions that caused the wound or that may make the wound worse. Use certain mattresses, overlays, chair cushions, or protective boots. Move medical devices from an area of pressure, or place padding between the skin and the device. Use foams, creams, or powders to protect your skin from sweat, urine, and stool and reduce rubbing (friction) on the skin. Keeping your skin clean and dry. This may include using a skin cleanser or barrier as told by your health care provider. Cleaning your injury and getting rid of any dead tissue from the wound (debridement). Placing a protective medicine, such as a cream, or bandage (dressing) over your injury. Using medicines for pain or to prevent or treat infection. Surgery may be needed if other treatments are not working or if your injury is very deep. Follow these instructions at home: Medicines Take over-the-counter and prescription medicines only as told by your health care provider. If you were prescribed antibiotics, take or apply them as told by your health care provider. Do not stop using the antibiotic even if you start to  feel better. Eating and drinking Drink enough fluid to keep your urine pale yellow. Eat a healthy diet with lots of protein, as told by your health care provider. Do not use drugs or drink alcohol. Wound care Follow instructions from your health care provider about how to take care of your wound. Make sure you: Wash your hands with soap and water before and after you change  your dressing or apply medicine to your skin. If soap and water are not available, use hand sanitizer. Change your dressing as told by your health care provider. Check your wound every day for signs of infection. Have a caregiver do this for you if you are not able. Check for: Redness, swelling, or more pain. More fluid or blood. Warmth. Pus or a bad smell. Skin care Keep your skin clean and dry. Gently pat your skin dry. Do not rub or massage your skin. Check your skin every day for any changes in color or any new blisters or sores (ulcers). Reducing pressure Do not lie or sit in one position for a long time. Move or change position every 1-2 hours, or as told by your health care provider. Use pillows or cushions to reduce pressure. Ask your health care provider what cushions or pads you should use. General instructions Do not use any products that contain nicotine or tobacco. These products include cigarettes, chewing tobacco, and vaping devices, such as e-cigarettes. If you need help quitting, ask your health care provider. Try to be active every day. Ask your health care provider what exercises or activities are safe for you. Keep all follow-up visits. Your health care provider will check if your injury is healing. Contact a health care provider if: You have a fever or chills. You have pain that does not get better with medicine. Your skin changes color. You have new blisters or sores. You have signs of infection. Your wound does not get better after 1-2 weeks of treatment. This information is not intended to replace advice given to you by your health care provider. Make sure you discuss any questions you have with your health care provider. Document Revised: 07/10/2021 Document Reviewed: 06/15/2021 Elsevier Patient Education  2024 [employer].

## 2023-03-11 NOTE — Progress Notes (Signed)
Subjective:  Patient ID: Victor Christensen, male    DOB: November 06, 1970  Age: 53 y.o. MRN: [id number]  CC: Wound Check   HPI Victor Christensen presents for f/up -----    Discussed the use of AI scribe software for clinical note transcription with the patient, who gave verbal consent to proceed.  History of Present Illness   Victor Christensen is a 53 year old male who presents with a sore on his back.   He has had a sore on his back for approximately two weeks. The sore is bleeding and draining, and he has been using gauze to cover it. His wife assists him in applying the gauze.  He recalls a similar issue last year around the same time, which he managed by scraping it off.       Outpatient Medications Prior to Visit  Medication Sig Dispense Refill   atorvastatin (LIPITOR) 10 MG tablet Take 1 tablet (10 mg total) by mouth daily. 90 tablet 1   Blood Glucose Monitoring Suppl (CONTOUR NEXT GEN MONITOR) w/Device KIT 1 Act by Does not apply route 2 (two) times daily. 2 kit 0   Cholecalciferol (VITAMIN D3 PO) Take 2,000 Units by mouth daily.     cyanocobalamin 2000 MCG tablet Take 1 tablet (2,000 mcg total) by mouth daily. 90 tablet 1   cyclobenzaprine (FLEXERIL) 5 MG tablet Take 1 tablet (5 mg total) by mouth at bedtime as needed for muscle spasms. 30 tablet 0   folic acid (FOLVITE) 1 MG tablet Take 1 tablet (1 mg total) by mouth daily. 90 tablet 0   glucose blood test strip 1 each by Other route 2 (two) times daily. Use as instructed 100 each 5   ibuprofen (ADVIL) 600 MG tablet Take 1 tablet (600 mg total) by mouth every 6 (six) hours as needed. 30 tablet 0   metFORMIN (GLUCOPHAGE-XR) 750 MG 24 hr tablet TAKE 1 TABLET (750 MG TOTAL) BY MOUTH DAILY WITH BREAKFAST. 90 tablet 1   tamsulosin (FLOMAX) 0.4 MG CAPS capsule Take 0.4 mg by mouth at bedtime.     No facility-administered medications prior to visit.    ROS Review of Systems  Constitutional:  Negative for chills, diaphoresis, fatigue and fever.   HENT: Negative.    Eyes: Negative.   Respiratory: Negative.  Negative for cough, chest tightness, shortness of breath and wheezing.   Cardiovascular:  Negative for chest pain, palpitations and leg swelling.  Gastrointestinal: Negative.  Negative for abdominal pain, constipation, diarrhea, nausea and vomiting.  Genitourinary: Negative.   Musculoskeletal: Negative.   Skin:  Positive for wound.  Neurological: Negative.  Negative for dizziness and light-headedness.  Hematological: Negative.   Psychiatric/Behavioral: Negative.      Objective:  BP 110/74 (BP Location: Left Arm, Patient Position: Sitting, Cuff Size: Normal)   Pulse 80   Temp 98.3 F (36.8 C) (Oral)   Resp 16   SpO2 97%   BP Readings from Last 3 Encounters:  03/11/23 110/74  02/20/23 104/80  12/02/22 109/72    Wt Readings from Last 3 Encounters:  02/20/23 215 lb (97.5 kg)  11/07/22 213 lb 12.8 oz (97 kg)  07/24/22 230 lb (104.3 kg)    Physical Exam Vitals reviewed.  HENT:     Mouth/Throat:     Mouth: Mucous membranes are moist.  Eyes:     General: No scleral icterus.    Conjunctiva/sclera: Conjunctivae normal.  Cardiovascular:     Rate and Rhythm: Normal rate and regular rhythm.  Heart sounds: No murmur heard.    No gallop.  Pulmonary:     Effort: Pulmonary effort is normal.     Breath sounds: No stridor. No wheezing, rhonchi or rales.  Abdominal:     General: Abdomen is flat.     Palpations: There is no mass.     Tenderness: There is no abdominal tenderness. There is no guarding.     Hernia: No hernia is present.  Musculoskeletal:        General: Normal range of motion.     Cervical back: Neck supple.       Back:     Right lower leg: No edema.     Left lower leg: No edema.  Lymphadenopathy:     Cervical: No cervical adenopathy.  Skin:    General: Skin is warm and dry.     Findings: No rash.  Neurological:     General: No focal deficit present.     Mental Status: He is alert. Mental  status is at baseline.  Psychiatric:        Mood and Affect: Mood normal.        Behavior: Behavior normal.     Lab Results  Component Value Date   WBC 3.7 (L) 02/20/2023   HGB 12.9 (L) 02/20/2023   HCT 40.0 02/20/2023   PLT 309.0 02/20/2023   GLUCOSE 99 04/04/2022   CHOL 135 02/20/2023   TRIG 124.0 02/20/2023   HDL 40.10 02/20/2023   LDLCALC 70 02/20/2023   ALT 19 02/20/2023   AST 18 02/20/2023   NA 136 04/04/2022   K 4.9 04/04/2022   CL 100 04/04/2022   CREATININE 1.23 04/04/2022   BUN 16 04/04/2022   CO2 29 04/04/2022   TSH 1.52 04/04/2022   PSA 0.33 04/04/2022   HGBA1C 7.7 (H) 02/20/2023   MICROALBUR <0.7 02/20/2023    No results found.  Assessment & Plan:   Pressure injury of left buttock, stage 2 (HCC)- Does not appear infected. Will culture and treat if indicated. -     WOUND CULTURE; Future -     Ambulatory referral to Home Health     Follow-up: Return in about 3 months (around 06/08/2023).  Victor Linger, MD

## 2023-03-12 ENCOUNTER — Ambulatory Visit: Payer: Medicare PPO | Admitting: Internal Medicine

## 2023-03-14 ENCOUNTER — Telehealth: Payer: Self-pay

## 2023-03-14 ENCOUNTER — Encounter: Payer: Self-pay | Admitting: Internal Medicine

## 2023-03-14 LAB — WOUND CULTURE

## 2023-03-14 NOTE — Telephone Encounter (Signed)
Copied from CRM [id number]. Topic: Clinical - Home Health Verbal Orders >> Mar 14, 2023  3:04 PM Myrtice Lauth wrote: Caller/Agency: [facility] Callback Number: [phone] Service Requested: Skilled Nursing and  Frequency:  called to advised the pt requested to delay services until 03/17/23 that is when soc will begin  Any new concerns about the patient? Yes

## 2023-03-17 NOTE — Telephone Encounter (Signed)
 Ok for [employer]

## 2023-03-17 NOTE — Telephone Encounter (Signed)
 Called and was able to give verbal ok to [place]

## 2023-03-21 ENCOUNTER — Telehealth: Payer: Self-pay

## 2023-03-21 NOTE — Telephone Encounter (Signed)
 Copied from CRM [id number]. Topic: Clinical - Home Health Verbal Orders >> Mar 21, 2023  4:41 PM Myrtice Lauth wrote: Caller/Agency: [facility] Callback Number: [phone] Service Requested: Skilled Nursing Frequency: Notification of Start of care date will be 03/22/23. Wanted to inform the provider  Any new concerns about the patient? No

## 2023-03-22 DIAGNOSIS — Z9181 History of falling: Secondary | ICD-10-CM | POA: Diagnosis not present

## 2023-03-22 DIAGNOSIS — Z7984 Long term (current) use of oral hypoglycemic drugs: Secondary | ICD-10-CM | POA: Diagnosis not present

## 2023-03-22 DIAGNOSIS — L89320 Pressure ulcer of left buttock, unstageable: Secondary | ICD-10-CM | POA: Diagnosis not present

## 2023-03-22 DIAGNOSIS — E119 Type 2 diabetes mellitus without complications: Secondary | ICD-10-CM | POA: Diagnosis not present

## 2023-03-23 DIAGNOSIS — R051 Acute cough: Secondary | ICD-10-CM | POA: Diagnosis not present

## 2023-03-23 DIAGNOSIS — J189 Pneumonia, unspecified organism: Secondary | ICD-10-CM | POA: Diagnosis not present

## 2023-03-23 DIAGNOSIS — R062 Wheezing: Secondary | ICD-10-CM | POA: Diagnosis not present

## 2023-03-23 DIAGNOSIS — R509 Fever, unspecified: Secondary | ICD-10-CM | POA: Diagnosis not present

## 2023-03-24 NOTE — Telephone Encounter (Signed)
 Can I give the verbal orders ?

## 2023-03-24 NOTE — Telephone Encounter (Signed)
 Copied from CRM [id number]. Topic: Clinical - Home Health Verbal Orders >> Mar 24, 2023  7:45 AM Benetta Spar A wrote: Caller/Agency: [facility] with [facility] Callback Number: [phone] Service Requested: Skilled Nursing Frequency: 2x a week for a month; services started 03/22/23 Any new concerns about the patient? Yes Pt has wound that is yellow and vlack with eshar and would like to put Meti Honey to help with healing. Can leave VM number is a secure line

## 2023-03-24 NOTE — Telephone Encounter (Signed)
 Noted.

## 2023-03-24 NOTE — Telephone Encounter (Signed)
 Unable to reach [place], [facility]. Didn't leave the verbal orders on the voicemail because it didn't seem like a normal VM.

## 2023-03-25 NOTE — Telephone Encounter (Signed)
 Pt has wound that is yellow and vlack with eshar and would like to put Meti Honey to help with healing. Is this okay ?

## 2023-03-25 NOTE — Telephone Encounter (Signed)
 Verbal orders has been given

## 2023-03-28 DIAGNOSIS — E119 Type 2 diabetes mellitus without complications: Secondary | ICD-10-CM | POA: Diagnosis not present

## 2023-03-28 DIAGNOSIS — Z9181 History of falling: Secondary | ICD-10-CM | POA: Diagnosis not present

## 2023-03-28 DIAGNOSIS — Z7984 Long term (current) use of oral hypoglycemic drugs: Secondary | ICD-10-CM | POA: Diagnosis not present

## 2023-03-28 DIAGNOSIS — L89320 Pressure ulcer of left buttock, unstageable: Secondary | ICD-10-CM | POA: Diagnosis not present

## 2023-03-28 NOTE — Telephone Encounter (Signed)
 Patient has been made aware. Gave a verbal understanding.

## 2023-03-31 ENCOUNTER — Encounter: Payer: Self-pay | Admitting: Internal Medicine

## 2023-03-31 ENCOUNTER — Ambulatory Visit: Payer: Medicare PPO | Admitting: Internal Medicine

## 2023-03-31 ENCOUNTER — Telehealth: Payer: Self-pay

## 2023-03-31 VITALS — BP 118/80 | HR 117 | Temp 98.2°F | Ht 71.0 in

## 2023-03-31 DIAGNOSIS — J189 Pneumonia, unspecified organism: Secondary | ICD-10-CM | POA: Diagnosis not present

## 2023-03-31 NOTE — Progress Notes (Unsigned)
   Subjective:   Patient ID: Victor Christensen, male    DOB: October 03, 1970, 53 y.o.   MRN: [id number]  HPI The patient is a 53 YO male comnig in for pneumonia follow up urgent care. Feeling improved but not fully recovered.Still coughing.   Review of Systems  Constitutional:  Positive for fatigue.  HENT: Negative.    Eyes: Negative.   Respiratory:  Positive for cough. Negative for chest tightness and shortness of breath.   Cardiovascular:  Negative for chest pain, palpitations and leg swelling.  Gastrointestinal:  Negative for abdominal distention, abdominal pain, constipation, diarrhea, nausea and vomiting.  Musculoskeletal: Negative.   Skin: Negative.   Neurological: Negative.   Psychiatric/Behavioral: Negative.      Objective:  Physical Exam Constitutional:      Appearance: He is well-developed.  HENT:     Head: Normocephalic and atraumatic.  Cardiovascular:     Rate and Rhythm: Normal rate and regular rhythm.  Pulmonary:     Effort: Pulmonary effort is normal. No respiratory distress.     Breath sounds: Normal breath sounds. No wheezing or rales.  Abdominal:     General: Bowel sounds are normal. There is no distension.     Palpations: Abdomen is soft.     Tenderness: There is no abdominal tenderness. There is no rebound.  Musculoskeletal:     Cervical back: Normal range of motion.  Skin:    General: Skin is warm and dry.  Neurological:     Mental Status: He is alert and oriented to person, place, and time.     Coordination: Coordination normal.     Vitals:   03/31/23 0937  BP: 118/80  Pulse: (!) 117  Temp: 98.2 F (36.8 C)  TempSrc: Oral  SpO2: 97%  Height: 5\' 11"  (1.803 m)    Assessment & Plan:

## 2023-03-31 NOTE — Telephone Encounter (Signed)
 Copied from CRM [id number]. Topic: Clinical - Home Health Verbal Orders >> Mar 31, 2023  9:12 AM Fredrich Romans wrote: Caller/Agency: [facility] Number: [phone]   Cala Bradford from [facility] called stating that Patient would like in home Physical Therapy due to him being limited to wheelchair to increase and maintain his strength .She would like to know if  he could have an order sent out to have PT through [facility]?

## 2023-03-31 NOTE — Patient Instructions (Signed)
 We have ordered a chest x-ray to do the last week of march or first week of April.   If you are worsening or not improving let us know or return before then.

## 2023-04-01 DIAGNOSIS — E119 Type 2 diabetes mellitus without complications: Secondary | ICD-10-CM | POA: Diagnosis not present

## 2023-04-01 DIAGNOSIS — Z7984 Long term (current) use of oral hypoglycemic drugs: Secondary | ICD-10-CM | POA: Diagnosis not present

## 2023-04-01 DIAGNOSIS — L89320 Pressure ulcer of left buttock, unstageable: Secondary | ICD-10-CM | POA: Diagnosis not present

## 2023-04-01 DIAGNOSIS — Z9181 History of falling: Secondary | ICD-10-CM | POA: Diagnosis not present

## 2023-04-01 NOTE — Telephone Encounter (Signed)
 Is this okay?

## 2023-04-02 NOTE — Telephone Encounter (Signed)
Orders were given ?

## 2023-04-03 ENCOUNTER — Encounter: Payer: Self-pay | Admitting: Internal Medicine

## 2023-04-03 DIAGNOSIS — J189 Pneumonia, unspecified organism: Secondary | ICD-10-CM | POA: Insufficient documentation

## 2023-04-03 NOTE — Assessment & Plan Note (Signed)
 Clinically improving and finishing antibiotics. Will order CXR and advise to come in 3-4 weeks. Sooner call if worsening or not improving.

## 2023-04-04 DIAGNOSIS — L89320 Pressure ulcer of left buttock, unstageable: Secondary | ICD-10-CM | POA: Diagnosis not present

## 2023-04-04 DIAGNOSIS — Z7984 Long term (current) use of oral hypoglycemic drugs: Secondary | ICD-10-CM | POA: Diagnosis not present

## 2023-04-04 DIAGNOSIS — Z9181 History of falling: Secondary | ICD-10-CM | POA: Diagnosis not present

## 2023-04-04 DIAGNOSIS — E119 Type 2 diabetes mellitus without complications: Secondary | ICD-10-CM | POA: Diagnosis not present

## 2023-04-07 DIAGNOSIS — Z9181 History of falling: Secondary | ICD-10-CM | POA: Diagnosis not present

## 2023-04-07 DIAGNOSIS — Z7984 Long term (current) use of oral hypoglycemic drugs: Secondary | ICD-10-CM | POA: Diagnosis not present

## 2023-04-07 DIAGNOSIS — L89320 Pressure ulcer of left buttock, unstageable: Secondary | ICD-10-CM | POA: Diagnosis not present

## 2023-04-07 DIAGNOSIS — E119 Type 2 diabetes mellitus without complications: Secondary | ICD-10-CM | POA: Diagnosis not present

## 2023-04-07 NOTE — Telephone Encounter (Signed)
 Copied from CRM [id number]. Topic: Clinical - Home Health Verbal Orders >> Apr 07, 2023 11:54 AM Denese Killings wrote: Caller/Agency: [facility] Callback Number: [phone] Service Requested: Physical Therapy Frequency: 1x a week for 4 weeks Any new concerns about the patient? Yes Tachycardia today without symptoms with a heart rate 115

## 2023-04-08 DIAGNOSIS — Z9181 History of falling: Secondary | ICD-10-CM | POA: Diagnosis not present

## 2023-04-08 DIAGNOSIS — E119 Type 2 diabetes mellitus without complications: Secondary | ICD-10-CM | POA: Diagnosis not present

## 2023-04-08 DIAGNOSIS — L89320 Pressure ulcer of left buttock, unstageable: Secondary | ICD-10-CM | POA: Diagnosis not present

## 2023-04-08 DIAGNOSIS — Z7984 Long term (current) use of oral hypoglycemic drugs: Secondary | ICD-10-CM | POA: Diagnosis not present

## 2023-04-09 NOTE — Telephone Encounter (Signed)
 May I give the orders ?

## 2023-04-09 NOTE — Telephone Encounter (Signed)
 yes

## 2023-04-09 NOTE — Telephone Encounter (Signed)
 Verbal orders given

## 2023-04-11 DIAGNOSIS — L89320 Pressure ulcer of left buttock, unstageable: Secondary | ICD-10-CM | POA: Diagnosis not present

## 2023-04-11 DIAGNOSIS — Z7984 Long term (current) use of oral hypoglycemic drugs: Secondary | ICD-10-CM | POA: Diagnosis not present

## 2023-04-11 DIAGNOSIS — E119 Type 2 diabetes mellitus without complications: Secondary | ICD-10-CM | POA: Diagnosis not present

## 2023-04-11 DIAGNOSIS — Z9181 History of falling: Secondary | ICD-10-CM | POA: Diagnosis not present

## 2023-04-14 DIAGNOSIS — L89320 Pressure ulcer of left buttock, unstageable: Secondary | ICD-10-CM | POA: Diagnosis not present

## 2023-04-14 DIAGNOSIS — Z9181 History of falling: Secondary | ICD-10-CM | POA: Diagnosis not present

## 2023-04-14 DIAGNOSIS — Z7984 Long term (current) use of oral hypoglycemic drugs: Secondary | ICD-10-CM | POA: Diagnosis not present

## 2023-04-14 DIAGNOSIS — E119 Type 2 diabetes mellitus without complications: Secondary | ICD-10-CM | POA: Diagnosis not present

## 2023-04-15 DIAGNOSIS — Z7984 Long term (current) use of oral hypoglycemic drugs: Secondary | ICD-10-CM | POA: Diagnosis not present

## 2023-04-15 DIAGNOSIS — Z9181 History of falling: Secondary | ICD-10-CM | POA: Diagnosis not present

## 2023-04-15 DIAGNOSIS — E119 Type 2 diabetes mellitus without complications: Secondary | ICD-10-CM | POA: Diagnosis not present

## 2023-04-15 DIAGNOSIS — L89320 Pressure ulcer of left buttock, unstageable: Secondary | ICD-10-CM | POA: Diagnosis not present

## 2023-04-17 DIAGNOSIS — L89320 Pressure ulcer of left buttock, unstageable: Secondary | ICD-10-CM | POA: Diagnosis not present

## 2023-04-17 DIAGNOSIS — E119 Type 2 diabetes mellitus without complications: Secondary | ICD-10-CM | POA: Diagnosis not present

## 2023-04-17 DIAGNOSIS — Z7984 Long term (current) use of oral hypoglycemic drugs: Secondary | ICD-10-CM | POA: Diagnosis not present

## 2023-04-17 DIAGNOSIS — Z9181 History of falling: Secondary | ICD-10-CM | POA: Diagnosis not present

## 2023-04-21 DIAGNOSIS — L89320 Pressure ulcer of left buttock, unstageable: Secondary | ICD-10-CM | POA: Diagnosis not present

## 2023-04-21 DIAGNOSIS — Z9181 History of falling: Secondary | ICD-10-CM | POA: Diagnosis not present

## 2023-04-21 DIAGNOSIS — E119 Type 2 diabetes mellitus without complications: Secondary | ICD-10-CM | POA: Diagnosis not present

## 2023-04-21 DIAGNOSIS — Z7984 Long term (current) use of oral hypoglycemic drugs: Secondary | ICD-10-CM | POA: Diagnosis not present

## 2023-04-24 DIAGNOSIS — L89320 Pressure ulcer of left buttock, unstageable: Secondary | ICD-10-CM | POA: Diagnosis not present

## 2023-04-24 DIAGNOSIS — Z9181 History of falling: Secondary | ICD-10-CM | POA: Diagnosis not present

## 2023-04-24 DIAGNOSIS — E119 Type 2 diabetes mellitus without complications: Secondary | ICD-10-CM | POA: Diagnosis not present

## 2023-04-24 DIAGNOSIS — Z7984 Long term (current) use of oral hypoglycemic drugs: Secondary | ICD-10-CM | POA: Diagnosis not present

## 2023-04-29 DIAGNOSIS — Z7984 Long term (current) use of oral hypoglycemic drugs: Secondary | ICD-10-CM | POA: Diagnosis not present

## 2023-04-29 DIAGNOSIS — Z9181 History of falling: Secondary | ICD-10-CM | POA: Diagnosis not present

## 2023-04-29 DIAGNOSIS — E119 Type 2 diabetes mellitus without complications: Secondary | ICD-10-CM | POA: Diagnosis not present

## 2023-04-29 DIAGNOSIS — L89320 Pressure ulcer of left buttock, unstageable: Secondary | ICD-10-CM | POA: Diagnosis not present

## 2023-04-30 ENCOUNTER — Ambulatory Visit (INDEPENDENT_AMBULATORY_CARE_PROVIDER_SITE_OTHER)

## 2023-04-30 DIAGNOSIS — J189 Pneumonia, unspecified organism: Secondary | ICD-10-CM

## 2023-04-30 DIAGNOSIS — R918 Other nonspecific abnormal finding of lung field: Secondary | ICD-10-CM | POA: Diagnosis not present

## 2023-05-05 ENCOUNTER — Encounter: Payer: Self-pay | Admitting: Internal Medicine

## 2023-05-08 DIAGNOSIS — E119 Type 2 diabetes mellitus without complications: Secondary | ICD-10-CM | POA: Diagnosis not present

## 2023-05-08 DIAGNOSIS — Z9181 History of falling: Secondary | ICD-10-CM | POA: Diagnosis not present

## 2023-05-08 DIAGNOSIS — Z7984 Long term (current) use of oral hypoglycemic drugs: Secondary | ICD-10-CM | POA: Diagnosis not present

## 2023-05-08 DIAGNOSIS — L89320 Pressure ulcer of left buttock, unstageable: Secondary | ICD-10-CM | POA: Diagnosis not present

## 2023-05-09 DIAGNOSIS — Z9181 History of falling: Secondary | ICD-10-CM | POA: Diagnosis not present

## 2023-05-09 DIAGNOSIS — Z7984 Long term (current) use of oral hypoglycemic drugs: Secondary | ICD-10-CM | POA: Diagnosis not present

## 2023-05-09 DIAGNOSIS — E119 Type 2 diabetes mellitus without complications: Secondary | ICD-10-CM | POA: Diagnosis not present

## 2023-05-09 DIAGNOSIS — L89320 Pressure ulcer of left buttock, unstageable: Secondary | ICD-10-CM | POA: Diagnosis not present

## 2023-05-12 DIAGNOSIS — N200 Calculus of kidney: Secondary | ICD-10-CM | POA: Diagnosis not present

## 2023-05-12 DIAGNOSIS — N529 Male erectile dysfunction, unspecified: Secondary | ICD-10-CM | POA: Diagnosis not present

## 2023-05-13 DIAGNOSIS — Z7984 Long term (current) use of oral hypoglycemic drugs: Secondary | ICD-10-CM | POA: Diagnosis not present

## 2023-05-13 DIAGNOSIS — E119 Type 2 diabetes mellitus without complications: Secondary | ICD-10-CM | POA: Diagnosis not present

## 2023-05-13 DIAGNOSIS — Z9181 History of falling: Secondary | ICD-10-CM | POA: Diagnosis not present

## 2023-05-13 DIAGNOSIS — L89320 Pressure ulcer of left buttock, unstageable: Secondary | ICD-10-CM | POA: Diagnosis not present

## 2023-05-19 DIAGNOSIS — Z7984 Long term (current) use of oral hypoglycemic drugs: Secondary | ICD-10-CM | POA: Diagnosis not present

## 2023-05-19 DIAGNOSIS — E119 Type 2 diabetes mellitus without complications: Secondary | ICD-10-CM | POA: Diagnosis not present

## 2023-05-19 DIAGNOSIS — Z9181 History of falling: Secondary | ICD-10-CM | POA: Diagnosis not present

## 2023-05-19 DIAGNOSIS — L89320 Pressure ulcer of left buttock, unstageable: Secondary | ICD-10-CM | POA: Diagnosis not present

## 2023-06-06 DIAGNOSIS — N529 Male erectile dysfunction, unspecified: Secondary | ICD-10-CM | POA: Diagnosis not present

## 2023-06-06 DIAGNOSIS — N200 Calculus of kidney: Secondary | ICD-10-CM | POA: Diagnosis not present

## 2023-06-09 ENCOUNTER — Other Ambulatory Visit: Payer: Self-pay | Admitting: Internal Medicine

## 2023-06-09 DIAGNOSIS — E119 Type 2 diabetes mellitus without complications: Secondary | ICD-10-CM

## 2023-06-12 ENCOUNTER — Other Ambulatory Visit: Payer: Self-pay | Admitting: Internal Medicine

## 2023-07-31 ENCOUNTER — Ambulatory Visit: Payer: Medicare PPO

## 2023-07-31 VITALS — Ht 71.0 in | Wt 215.0 lb

## 2023-07-31 DIAGNOSIS — E119 Type 2 diabetes mellitus without complications: Secondary | ICD-10-CM

## 2023-07-31 DIAGNOSIS — Z0100 Encounter for examination of eyes and vision without abnormal findings: Secondary | ICD-10-CM

## 2023-07-31 DIAGNOSIS — Z0000 Encounter for general adult medical examination without abnormal findings: Secondary | ICD-10-CM | POA: Diagnosis not present

## 2023-07-31 NOTE — Progress Notes (Signed)
 Subjective:   Victor Christensen is a 53 y.o. who presents for a Medicare Wellness preventive visit.  As a reminder, Annual Wellness Visits don't include a physical exam, and some assessments may be limited, especially if this visit is performed virtually. We may recommend an in-person follow-up visit with your provider if needed.  Visit Complete: Virtual I connected with  Victor Christensen on 07/31/23 by a audio enabled telemedicine application and verified that I am speaking with the correct person using two identifiers.  Patient Location: Home  Provider Location: Office/Clinic  I discussed the limitations of evaluation and management by telemedicine. The patient expressed understanding and agreed to proceed.  Vital Signs: Because this visit was a virtual/telehealth visit, some criteria may be missing or patient reported. Any vitals not documented were not able to be obtained and vitals that have been documented are patient reported.  VideoDeclined- This patient declined Librarian, academic. Therefore the visit was completed with audio only.  Persons Participating in Visit: Patient.  AWV Questionnaire: Yes: Patient Medicare AWV questionnaire was completed by the patient on 07/30/2023; I have confirmed that all information answered by patient is correct and no changes since this date.  Cardiac Risk Factors include: advanced age (>7men, >19 women);dyslipidemia;diabetes mellitus;male gender     Objective:    Today's Vitals   07/31/23 1137  Weight: 215 lb (97.5 kg)  Height: 5' 11 (1.803 m)   Body mass index is 29.99 kg/m.     07/31/2023   11:37 AM 07/24/2022    3:20 PM 09/10/2021    7:31 AM 07/23/2021    8:59 AM 07/10/2021    1:00 AM 07/05/2019   10:10 AM 02/05/2018    8:17 AM  Advanced Directives  Does Patient Have a Medical Advance Directive? No No No No No No No   Would patient like information on creating a medical advance directive? No - Patient declined No -  Patient declined No - Patient declined  No - Patient declined Yes (ED - Information included in AVS) No - Patient declined      Data saved with a previous flowsheet row definition    Current Medications (verified) Outpatient Encounter Medications as of 07/31/2023  Medication Sig   Blood Glucose Monitoring Suppl (CONTOUR NEXT GEN MONITOR) w/Device KIT 1 Act by Does not apply route 2 (two) times daily.   Cholecalciferol (VITAMIN D3 PO) Take 2,000 Units by mouth daily.   cyanocobalamin  2000 MCG tablet Take 1 tablet (2,000 mcg total) by mouth daily.   cyclobenzaprine  (FLEXERIL ) 5 MG tablet Take 1 tablet (5 mg total) by mouth at bedtime as needed for muscle spasms.   folic acid  (FOLVITE ) 1 MG tablet Take 1 tablet (1 mg total) by mouth daily.   glucose blood test strip 1 each by Other route 2 (two) times daily. Use as instructed   ibuprofen  (ADVIL ) 600 MG tablet Take 1 tablet (600 mg total) by mouth every 6 (six) hours as needed.   metFORMIN  (GLUCOPHAGE -XR) 750 MG 24 hr tablet TAKE 1 TABLET (750 MG TOTAL) BY MOUTH DAILY WITH BREAKFAST.   atorvastatin  (LIPITOR) 10 MG tablet Take 1 tablet (10 mg total) by mouth daily. (Patient not taking: Reported on 07/31/2023)   tamsulosin (FLOMAX) 0.4 MG CAPS capsule Take 0.4 mg by mouth at bedtime. (Patient not taking: Reported on 07/31/2023)   No facility-administered encounter medications on file as of 07/31/2023.    Allergies (verified) Duloxetine  and Oxycodone -acetaminophen    History: Past Medical History:  Diagnosis Date   Anxiety    Diabetes mellitus without complication (HCC)    type 2    History of kidney stones    Neuromyelitis optica (HCC)    Paraplegia (HCC) 2014   per Patient   Past Surgical History:  Procedure Laterality Date   CYSTOSCOPY W/ URETERAL STENT PLACEMENT Left 07/09/2021   Procedure: CYSTOSCOPY WITH RETROGRADE PYELOGRAM/URETERAL STENT PLACEMENT;  Surgeon: Lovie Arlyss CROME, MD;  Location: [facility];  Service: Urology;  Laterality: Left;    CYSTOSCOPY WITH STENT PLACEMENT Left 01/15/2018   Procedure: CYSTOSCOPY WITH STENT PLACEMENT retrograde pylegram;  Surgeon: Matilda Senior, MD;  Location: [facility];  Service: Urology;  Laterality: Left;   CYSTOSCOPY/URETEROSCOPY/HOLMIUM LASER/STENT PLACEMENT Left 02/05/2018   Procedure: CYSTOSCOPY/URETEROSCOPY/HOLMIUM LASER/STENT EXTRACTION WITH LEFT STENT PLACEMENT;  Surgeon: Matilda Senior, MD;  Location: [facility];  Service: Urology;  Laterality: Left;   CYSTOSCOPY/URETEROSCOPY/HOLMIUM LASER/STENT PLACEMENT Left 07/24/2021   Procedure: LEFT URETEROSCOPY/HOLMIUM LASER/STENT PLACEMENT;  Surgeon: Lovie Arlyss CROME, MD;  Location: [facility];  Service: Urology;  Laterality: Left;  90 MINUTES   EXTRACORPOREAL SHOCK WAVE LITHOTRIPSY Right 09/10/2021   Procedure: EXTRACORPOREAL SHOCK WAVE LITHOTRIPSY (ESWL);  Surgeon: Alvaro Hummer, MD;  Location: [facility];  Service: Urology;  Laterality: Right;   Family History  Problem Relation Age of Onset   Hypertension Mother    Hypertension Father    Diabetes Father    Social History   Socioeconomic History   Marital status: Married    Spouse name: Not on file   Number of children: 2   Years of education: college   Highest education level: Bachelor's degree (e.g., BA, AB, BS)  Occupational History   Occupation: Disabled  Tobacco Use   Smoking status: Never   Smokeless tobacco: Never  Vaping Use   Vaping status: Never Used  Substance and Sexual Activity   Alcohol use: Not Currently   Drug use: Never   Sexual activity: Yes  Other Topics Concern   Not on file  Social History Narrative   Lives at home with his wife and son.   Right-handed.   Moderate caffeine use.   Bachelor's Degree   Social Drivers of Corporate investment banker Strain: Low Risk  (07/31/2023)   Overall Financial Resource Strain (CARDIA)    Difficulty of Paying Living Expenses: Not very hard  Food Insecurity: No Food Insecurity (07/31/2023)   Hunger Vital  Sign    Worried About Running Out of Food in the Last Year: Never true    Ran Out of Food in the Last Year: Never true  Transportation Needs: No Transportation Needs (07/31/2023)   PRAPARE - Administrator, Civil Service (Medical): No    Lack of Transportation (Non-Medical): No  Physical Activity: Insufficiently Active (07/31/2023)   Exercise Vital Sign    Days of Exercise per Week: 3 days    Minutes of Exercise per Session: 30 min  Stress: No Stress Concern Present (07/31/2023)   [employer] of Occupational Health - Occupational Stress Questionnaire    Feeling of Stress: Not at all  Social Connections: Moderately Integrated (07/31/2023)   Social Connection and Isolation Panel    Frequency of Communication with Friends and Family: More than three times a week    Frequency of Social Gatherings with Friends and Family: Once a week    Attends Religious Services: Never    Database administrator or Organizations: Yes    Attends Engineer, structural: More than 4  times per year    Marital Status: Married    Tobacco Counseling Counseling given: No    Clinical Intake:  Pre-visit preparation completed: Yes  Pain : No/denies pain     BMI - recorded: 29.99 Nutritional Status: BMI 25 -29 Overweight Nutritional Risks: None Diabetes: Yes CBG done?: No Did pt. bring in CBG monitor from home?: No  Lab Results  Component Value Date   HGBA1C 7.7 (H) 02/20/2023   HGBA1C 7.4 (H) 08/12/2022   HGBA1C 6.4 04/04/2022     How often do you need to have someone help you when you read instructions, pamphlets, or other written materials from your doctor or pharmacy?: 1 - Never  Interpreter Needed?: No  Information entered by :: Verdie Saba, CMA   Activities of Daily Living     07/31/2023   11:42 AM 07/30/2023    3:28 PM  In your present state of health, do you have any difficulty performing the following activities:  Hearing? 0 0  Vision? 0 0  Difficulty  concentrating or making decisions? 0 0  Walking or climbing stairs? 1 1  Comment uses a wheelchair   Dressing or bathing? 0 0  Doing errands, shopping? 0 0  Preparing Food and eating ? N N  Using the Toilet? N N  In the past six months, have you accidently leaked urine? Victor Christensen Victor Christensen  Comment wears depends   Do you have problems with loss of bowel control? N N  Managing your Medications? N N  Managing your Finances? N N  Housekeeping or managing your Housekeeping? N N    Patient Care Team: Joshua Debby CROME, MD as PCP - General (Internal Medicine)  I have updated your Care Teams any recent Medical Services you may have received from other providers in the past year.     Assessment:   This is a routine wellness examination for Victor Christensen.  Hearing/Vision screen Hearing Screening - Comments:: Denies hearing difficulties   Vision Screening - Comments:: Denies Vision Concerns - due for an appt w/Dr Lamar Gaudy   Goals Addressed               This Visit's Progress     Patient Stated (pt-stated)        Patient stated that he's going to continue exercising and watching diet       Depression Screen     07/31/2023   11:44 AM 02/20/2023   10:33 AM 07/24/2022    3:22 PM 04/04/2022    8:48 AM 04/05/2021   10:45 AM 04/04/2020    1:05 PM 01/06/2020   11:33 AM  PHQ 2/9 Scores  PHQ - 2 Score 0 0 0 0 0 0 0  PHQ- 9 Score 3  1        Fall Risk     07/31/2023   11:44 AM 07/30/2023    3:28 PM 02/20/2023   10:33 AM 07/24/2022    3:21 PM 04/04/2022    8:48 AM  Fall Risk   Falls in the past year? 0 0 0 0 0  Number falls in past yr: 0 0 0 0 0  Injury with Fall? 0 0 0 0 0  Risk for fall due to : No Fall Risks  No Fall Risks No Fall Risks No Fall Risks  Follow up Falls evaluation completed;Falls prevention discussed  Falls evaluation completed Falls prevention discussed Falls evaluation completed    MEDICARE RISK AT HOME:  Medicare Risk at Home Any stairs  in or around the home?: No If so, are there any  without handrails?: No Home free of loose throw rugs in walkways, pet beds, electrical cords, etc?: No Adequate lighting in your home to reduce risk of falls?: Yes Life alert?: No Use of a cane, walker or w/c?: Yes (wheelchair) Grab bars in the bathroom?: Yes Shower chair or bench in shower?: Yes Elevated toilet seat or a handicapped toilet?: Yes  TIMED UP AND GO:  Was the test performed?  No  Cognitive Function: 6CIT completed        07/31/2023   11:47 AM 07/24/2022    3:23 PM 07/05/2019   10:10 AM  6CIT Screen  What Year? 0 points 0 points 0 points  What month? 0 points 0 points 0 points  What time? 0 points 0 points 0 points  Count back from 20 0 points 0 points 0 points  Months in reverse 0 points 0 points 0 points  Repeat phrase 0 points 0 points 0 points  Total Score 0 points 0 points 0 points    Immunizations Immunization History  Administered Date(s) Administered   PFIZER(Purple Top)SARS-COV-2 Vaccination 04/29/2019, 05/20/2019, 01/25/2020   PNEUMOCOCCAL CONJUGATE-20 04/05/2021   Tdap 04/11/2021    Screening Tests Health Maintenance  Topic Date Due   Diabetic kidney evaluation - Urine ACR  Never done   Hepatitis B Vaccines (1 of 3 - 19+ 3-dose series) Never done   Zoster Vaccines- Shingrix  (1 of 2) Never done   Diabetic kidney evaluation - eGFR measurement  04/04/2023   FOOT EXAM  04/04/2023   OPHTHALMOLOGY EXAM  06/17/2023   HEMOGLOBIN A1C  08/20/2023   Fecal DNA (Cologuard)  04/19/2024   Medicare Annual Wellness (AWV)  07/30/2024   DTaP/Tdap/Td (2 - Td or Tdap) 04/12/2031   Pneumococcal Vaccine 74-46 Years old  Completed   Hepatitis C Screening  Completed   HIV Screening  Completed   HPV VACCINES  Aged Out   Meningococcal B Vaccine  Aged Out   INFLUENZA VACCINE  Discontinued   COVID-19 Vaccine  Discontinued    Health Maintenance  Health Maintenance Due  Topic Date Due   Diabetic kidney evaluation - Urine ACR  Never done   Hepatitis B Vaccines (1  of 3 - 19+ 3-dose series) Never done   Zoster Vaccines- Shingrix  (1 of 2) Never done   Diabetic kidney evaluation - eGFR measurement  04/04/2023   FOOT EXAM  04/04/2023   OPHTHALMOLOGY EXAM  06/17/2023   Health Maintenance Items Addressed:  Referral sent to Optometry/Ophthalmology, Labs Ordered: Diabetic Kidney Urine ACR  Additional Screening:  Vision Screening: Recommended annual ophthalmology exams for early detection of glaucoma and other disorders of the eye. Would you like a referral to an eye doctor? Yes  - Referral to Dr Glendia Gaudy for a diabetic eye exam  Dental Screening: Recommended annual dental exams for proper oral hygiene  Community Resource Referral / Chronic Care Management: CRR required this visit?  No   CCM required this visit?  No   Plan:    I have personally reviewed and noted the following in the patient's chart:   Medical and social history Use of alcohol, tobacco or illicit drugs  Current medications and supplements including opioid prescriptions. Patient is not currently taking opioid prescriptions. Functional ability and status Nutritional status Physical activity Advanced directives List of other physicians Hospitalizations, surgeries, and ER visits in previous 12 months Vitals Screenings to include cognitive, depression, and falls Referrals and appointments  In addition, I have reviewed and discussed with patient certain preventive protocols, quality metrics, and best practice recommendations. A written personalized care plan for preventive services as well as general preventive health recommendations were provided to patient.   Verdie CHRISTELLA Saba, CMA   07/31/2023   After Visit Summary: (MyChart) Due to this being a telephonic visit, the after visit summary with patients personalized plan was offered to patient via MyChart   Notes: Nothing significant to report at this time.

## 2023-07-31 NOTE — Patient Instructions (Addendum)
 Mr. Victor Christensen , Thank you for taking time out of your busy schedule to complete your Annual Wellness Visit with me. I enjoyed our conversation and look forward to speaking with you again next year. I, as well as your care team,  appreciate your ongoing commitment to your health goals. Please review the following plan we discussed and let me know if I can assist you in the future. Your Game plan/ To Do List    Referrals: If you haven't heard from the office you've been referred to, please reach out to them at the phone provided.  Referral to Dr Glendia Gaudy for a Diabetic eye exam; Ordered a Diabetic Kidney Urine (Lab) Follow up Visits: Next Medicare AWV with our clinical staff: 08/02/2024   Have you seen your provider in the last 6 months (3 months if uncontrolled diabetes)? No Next Office Visit with your provider: 08/04/2023  Clinician Recommendations:  Aim for 30 minutes of exercise or brisk walking, 6-8 glasses of water , and 5 servings of fruits and vegetables each day. Educated and advised on getting the Hepatitis B and Shingles vaccines in 2025.      This is a list of the screening recommended for you and due dates:  Health Maintenance  Topic Date Due   Yearly kidney health urinalysis for diabetes  Never done   Hepatitis B Vaccine (1 of 3 - 19+ 3-dose series) Never done   Zoster (Shingles) Vaccine (1 of 2) Never done   Yearly kidney function blood test for diabetes  04/04/2023   Complete foot exam   04/04/2023   Eye exam for diabetics  06/17/2023   Hemoglobin A1C  08/20/2023   Cologuard (Stool DNA test)  04/19/2024   Medicare Annual Wellness Visit  07/30/2024   DTaP/Tdap/Td vaccine (2 - Td or Tdap) 04/12/2031   Pneumococcal Vaccination  Completed   Hepatitis C Screening  Completed   HIV Screening  Completed   HPV Vaccine  Aged Out   Meningitis B Vaccine  Aged Out   Flu Shot  Discontinued   COVID-19 Vaccine  Discontinued    Advanced directives: (Declined) Advance directive discussed  with you today. Even though you declined this today, please call our office should you change your mind, and we can give you the proper paperwork for you to fill out. Advance Care Planning is important because it:  [x]  Makes sure you receive the medical care that is consistent with your values, goals, and preferences  [x]  It provides guidance to your family and loved ones and reduces their decisional burden about whether or not they are making the right decisions based on your wishes.  Follow the link provided in your after visit summary or read over the paperwork we have mailed to you to help you started getting your Advance Directives in place. If you need assistance in completing these, please reach out to us  so that we can help you!

## 2023-08-04 ENCOUNTER — Other Ambulatory Visit: Payer: Self-pay | Admitting: Internal Medicine

## 2023-08-04 ENCOUNTER — Ambulatory Visit (INDEPENDENT_AMBULATORY_CARE_PROVIDER_SITE_OTHER): Admitting: Internal Medicine

## 2023-08-04 ENCOUNTER — Encounter: Payer: Self-pay | Admitting: Internal Medicine

## 2023-08-04 VITALS — BP 114/86 | HR 99 | Temp 98.5°F | Resp 16 | Ht 71.0 in | Wt 215.0 lb

## 2023-08-04 DIAGNOSIS — I950 Idiopathic hypotension: Secondary | ICD-10-CM | POA: Diagnosis not present

## 2023-08-04 DIAGNOSIS — E538 Deficiency of other specified B group vitamins: Secondary | ICD-10-CM | POA: Diagnosis not present

## 2023-08-04 DIAGNOSIS — L989 Disorder of the skin and subcutaneous tissue, unspecified: Secondary | ICD-10-CM | POA: Diagnosis not present

## 2023-08-04 DIAGNOSIS — Z7984 Long term (current) use of oral hypoglycemic drugs: Secondary | ICD-10-CM

## 2023-08-04 DIAGNOSIS — Z125 Encounter for screening for malignant neoplasm of prostate: Secondary | ICD-10-CM

## 2023-08-04 DIAGNOSIS — E119 Type 2 diabetes mellitus without complications: Secondary | ICD-10-CM | POA: Diagnosis not present

## 2023-08-04 DIAGNOSIS — E785 Hyperlipidemia, unspecified: Secondary | ICD-10-CM | POA: Diagnosis not present

## 2023-08-04 DIAGNOSIS — Z23 Encounter for immunization: Secondary | ICD-10-CM | POA: Diagnosis not present

## 2023-08-04 DIAGNOSIS — G369 Acute disseminated demyelination, unspecified: Secondary | ICD-10-CM

## 2023-08-04 DIAGNOSIS — Z0000 Encounter for general adult medical examination without abnormal findings: Secondary | ICD-10-CM | POA: Diagnosis not present

## 2023-08-04 DIAGNOSIS — Z0001 Encounter for general adult medical examination with abnormal findings: Secondary | ICD-10-CM

## 2023-08-04 LAB — BASIC METABOLIC PANEL WITH GFR
BUN: 13 mg/dL (ref 6–23)
CO2: 26 meq/L (ref 19–32)
Calcium: 9.8 mg/dL (ref 8.4–10.5)
Chloride: 101 meq/L (ref 96–112)
Creatinine, Ser: 1.21 mg/dL (ref 0.40–1.50)
GFR: 69.09 mL/min
Glucose, Bld: 80 mg/dL (ref 70–99)
Potassium: 4.5 meq/L (ref 3.5–5.1)
Sodium: 135 meq/L (ref 135–145)

## 2023-08-04 LAB — CBC WITH DIFFERENTIAL/PLATELET
Basophils Absolute: 0 K/uL (ref 0.0–0.1)
Basophils Relative: 1 % (ref 0.0–3.0)
Eosinophils Absolute: 0.2 K/uL (ref 0.0–0.7)
Eosinophils Relative: 8.1 % — ABNORMAL HIGH (ref 0.0–5.0)
HCT: 39.3 % (ref 39.0–52.0)
Hemoglobin: 12.7 g/dL — ABNORMAL LOW (ref 13.0–17.0)
Lymphocytes Relative: 23.8 % (ref 12.0–46.0)
Lymphs Abs: 0.7 K/uL (ref 0.7–4.0)
MCHC: 32.3 g/dL (ref 30.0–36.0)
MCV: 76 fl — ABNORMAL LOW (ref 78.0–100.0)
Monocytes Absolute: 0.5 K/uL (ref 0.1–1.0)
Monocytes Relative: 16.3 % — ABNORMAL HIGH (ref 3.0–12.0)
Neutro Abs: 1.6 K/uL (ref 1.4–7.7)
Neutrophils Relative %: 50.8 % (ref 43.0–77.0)
Platelets: 249 K/uL (ref 150.0–400.0)
RBC: 5.17 Mil/uL (ref 4.22–5.81)
RDW: 15.9 % — ABNORMAL HIGH (ref 11.5–15.5)
WBC: 3.1 K/uL — ABNORMAL LOW (ref 4.0–10.5)

## 2023-08-04 LAB — TSH: TSH: 2.18 u[IU]/mL (ref 0.35–5.50)

## 2023-08-04 LAB — HEMOGLOBIN A1C: Hgb A1c MFr Bld: 7.1 % — ABNORMAL HIGH (ref 4.6–6.5)

## 2023-08-04 LAB — PSA: PSA: 0.41 ng/mL (ref 0.10–4.00)

## 2023-08-04 LAB — MICROALBUMIN / CREATININE URINE RATIO
Creatinine,U: 107.9 mg/dL
Microalb Creat Ratio: 9.5 mg/g (ref 0.0–30.0)
Microalb, Ur: 1 mg/dL (ref 0.0–1.9)

## 2023-08-04 MED ORDER — SHINGRIX 50 MCG/0.5ML IM SUSR: 0.5000 mL | Freq: Once | INTRAMUSCULAR | 1 refills | Status: AC

## 2023-08-04 MED ORDER — FOLIC ACID 1 MG PO TABS: 1.0000 mg | ORAL_TABLET | Freq: Every day | ORAL | 1 refills | Status: DC

## 2023-08-04 NOTE — Progress Notes (Unsigned)
 Subjective:  Patient ID: Victor Christensen, male    DOB: 07/03/1970  Age: 53 y.o. MRN: [id number]  CC: Annual Exam, Anemia, Hypertension, Hyperlipidemia, and Diabetes   HPI Victor Christensen presents for a CPX and f/up ----  Outpatient Medications Prior to Visit  Medication Sig Dispense Refill   Blood Glucose Monitoring Suppl (CONTOUR NEXT GEN MONITOR) w/Device KIT 1 Act by Does not apply route 2 (two) times daily. 2 kit 0   cyanocobalamin  2000 MCG tablet Take 1 tablet (2,000 mcg total) by mouth daily. 90 tablet 1   glucose blood test strip 1 each by Other route 2 (two) times daily. Use as instructed 100 each 5   metFORMIN  (GLUCOPHAGE -XR) 750 MG 24 hr tablet TAKE 1 TABLET (750 MG TOTAL) BY MOUTH DAILY WITH BREAKFAST. 90 tablet 1   ibuprofen  (ADVIL ) 600 MG tablet Take 1 tablet (600 mg total) by mouth every 6 (six) hours as needed. 30 tablet 0   atorvastatin  (LIPITOR) 10 MG tablet Take 1 tablet (10 mg total) by mouth daily. (Patient not taking: Reported on 07/31/2023) 90 tablet 1   Cholecalciferol (VITAMIN D3 PO) Take 2,000 Units by mouth daily.     cyclobenzaprine  (FLEXERIL ) 5 MG tablet Take 1 tablet (5 mg total) by mouth at bedtime as needed for muscle spasms. 30 tablet 0   folic acid  (FOLVITE ) 1 MG tablet Take 1 tablet (1 mg total) by mouth daily. 90 tablet 0   tamsulosin (FLOMAX) 0.4 MG CAPS capsule Take 0.4 mg by mouth at bedtime. (Patient not taking: Reported on 07/31/2023)     No facility-administered medications prior to visit.    ROS Review of Systems  Objective:  BP 114/86 (BP Location: Left Arm, Patient Position: Sitting, Cuff Size: Normal)   Pulse 99   Temp 98.5 F (36.9 C) (Oral)   Ht 5' 11 (1.803 m)   SpO2 98%   BMI 29.99 kg/m   BP Readings from Last 3 Encounters:  08/04/23 114/86  03/31/23 118/80  03/11/23 110/74    Wt Readings from Last 3 Encounters:  07/31/23 215 lb (97.5 kg)  02/20/23 215 lb (97.5 kg)  11/07/22 213 lb 12.8 oz (97 kg)    Physical  Exam  Lab Results  Component Value Date   WBC 3.7 (L) 02/20/2023   HGB 12.9 (L) 02/20/2023   HCT 40.0 02/20/2023   PLT 309.0 02/20/2023   GLUCOSE 99 04/04/2022   CHOL 135 02/20/2023   TRIG 124.0 02/20/2023   HDL 40.10 02/20/2023   LDLCALC 70 02/20/2023   ALT 19 02/20/2023   AST 18 02/20/2023   NA 136 04/04/2022   K 4.9 04/04/2022   CL 100 04/04/2022   CREATININE 1.23 04/04/2022   BUN 16 04/04/2022   CO2 29 04/04/2022   TSH 1.52 04/04/2022   PSA 0.33 04/04/2022   HGBA1C 7.7 (H) 02/20/2023    No results found.  Assessment & Plan:  Screening for prostate cancer -     PSA; Future  Type 2 diabetes mellitus without complication, without long-term current use of insulin  (HCC) -     Basic metabolic panel with GFR; Future -     Microalbumin / creatinine urine ratio; Future -     Hemoglobin A1c; Future -     HM Diabetes Foot Exam -     Ambulatory referral to Ophthalmology  Folate deficiency -     Folic Acid ; Take 1 tablet (1 mg total) by mouth daily.  Dispense: 90 tablet; Refill: 1  B12 deficiency -     CBC with Differential/Platelet; Future  Dyslipidemia, goal LDL below 100 -     TSH; Future  Need for prophylactic vaccination and inoculation against varicella -     Shingrix ; Inject 0.5 mLs into the muscle once for 1 dose.  Dispense: 0.5 mL; Refill: 1  Neuromyelitis (HCC) -     For home use only DME Wheelchair electric  Immunization due -     Heplisav-B  (HepB-CPG) Vaccine  Idiopathic hypotension -     EKG 12-Lead     Follow-up: Return in about 6 months (around 02/04/2024).  Debby Molt, MD

## 2023-08-04 NOTE — Patient Instructions (Signed)
 Health Maintenance, Male  Adopting a healthy lifestyle and getting preventive care are important in promoting health and wellness. Ask your health care provider about:  The right schedule for you to have regular tests and exams.  Things you can do on your own to prevent diseases and keep yourself healthy.  What should I know about diet, weight, and exercise?  Eat a healthy diet    Eat a diet that includes plenty of vegetables, fruits, low-fat dairy products, and lean protein.  Do not eat a lot of foods that are high in solid fats, added sugars, or sodium.  Maintain a healthy weight  Body mass index (BMI) is a measurement that can be used to identify possible weight problems. It estimates body fat based on height and weight. Your health care provider can help determine your BMI and help you achieve or maintain a healthy weight.  Get regular exercise  Get regular exercise. This is one of the most important things you can do for your health. Most adults should:  Exercise for at least 150 minutes each week. The exercise should increase your heart rate and make you sweat (moderate-intensity exercise).  Do strengthening exercises at least twice a week. This is in addition to the moderate-intensity exercise.  Spend less time sitting. Even light physical activity can be beneficial.  Watch cholesterol and blood lipids  Have your blood tested for lipids and cholesterol at 52 years of age, then have this test every 5 years.  You may need to have your cholesterol levels checked more often if:  Your lipid or cholesterol levels are high.  You are older than 52 years of age.  You are at high risk for heart disease.  What should I know about cancer screening?  Many types of cancers can be detected early and may often be prevented. Depending on your health history and family history, you may need to have cancer screening at various ages. This may include screening for:  Colorectal cancer.  Prostate cancer.  Skin cancer.  Lung  cancer.  What should I know about heart disease, diabetes, and high blood pressure?  Blood pressure and heart disease  High blood pressure causes heart disease and increases the risk of stroke. This is more likely to develop in people who have high blood pressure readings or are overweight.  Talk with your health care provider about your target blood pressure readings.  Have your blood pressure checked:  Every 3-5 years if you are 9-95 years of age.  Every year if you are 85 years old or older.  If you are between the ages of 29 and 29 and are a current or former smoker, ask your health care provider if you should have a one-time screening for abdominal aortic aneurysm (AAA).  Diabetes  Have regular diabetes screenings. This checks your fasting blood sugar level. Have the screening done:  Once every three years after age 23 if you are at a normal weight and have a low risk for diabetes.  More often and at a younger age if you are overweight or have a high risk for diabetes.  What should I know about preventing infection?  Hepatitis B  If you have a higher risk for hepatitis B, you should be screened for this virus. Talk with your health care provider to find out if you are at risk for hepatitis B infection.  Hepatitis C  Blood testing is recommended for:  Everyone born from [birth year] through 1964.  Anyone  with known risk factors for hepatitis C.  Sexually transmitted infections (STIs)  You should be screened each year for STIs, including gonorrhea and chlamydia, if:  You are sexually active and are younger than 52 years of age.  You are older than 52 years of age and your health care provider tells you that you are at risk for this type of infection.  Your sexual activity has changed since you were last screened, and you are at increased risk for chlamydia or gonorrhea. Ask your health care provider if you are at risk.  Ask your health care provider about whether you are at high risk for HIV. Your health care provider  may recommend a prescription medicine to help prevent HIV infection. If you choose to take medicine to prevent HIV, you should first get tested for HIV. You should then be tested every 3 months for as long as you are taking the medicine.  Follow these instructions at home:  Alcohol use  Do not drink alcohol if your health care provider tells you not to drink.  If you drink alcohol:  Limit how much you have to 0-2 drinks a day.  Know how much alcohol is in your drink. In the [place] one drink equals one 12 oz bottle of beer (355 mL), one 5 oz glass of wine (148 mL), or one 1 oz glass of hard liquor (44 mL).  Lifestyle  Do not use any products that contain nicotine or tobacco. These products include cigarettes, chewing tobacco, and vaping devices, such as e-cigarettes. If you need help quitting, ask your health care provider.  Do not use street drugs.  Do not share needles.  Ask your health care provider for help if you need support or information about quitting drugs.  General instructions  Schedule regular health, dental, and eye exams.  Stay current with your vaccines.  Tell your health care provider if:  You often feel depressed.  You have ever been abused or do not feel safe at home.  Summary  Adopting a healthy lifestyle and getting preventive care are important in promoting health and wellness.  Follow your health care provider's instructions about healthy diet, exercising, and getting tested or screened for diseases.  Follow your health care provider's instructions on monitoring your cholesterol and blood pressure.  This information is not intended to replace advice given to you by your health care provider. Make sure you discuss any questions you have with your health care provider.  Document Revised: 06/05/2020 Document Reviewed: 06/05/2020  Elsevier Patient Education  2024 [employer].

## 2023-08-05 ENCOUNTER — Ambulatory Visit: Payer: Self-pay | Admitting: Internal Medicine

## 2023-08-05 DIAGNOSIS — Z23 Encounter for immunization: Secondary | ICD-10-CM | POA: Insufficient documentation

## 2023-08-05 DIAGNOSIS — L989 Disorder of the skin and subcutaneous tissue, unspecified: Secondary | ICD-10-CM | POA: Insufficient documentation

## 2023-08-05 MED ORDER — CYANOCOBALAMIN 2000 MCG PO TABS: 2000.0000 ug | ORAL_TABLET | Freq: Every day | ORAL | 1 refills | Status: DC

## 2023-08-05 MED ORDER — METFORMIN HCL ER 750 MG PO TB24: 1500.0000 mg | ORAL_TABLET | Freq: Every day | ORAL | 1 refills | Status: DC

## 2023-08-05 MED ORDER — ATORVASTATIN CALCIUM 10 MG PO TABS
10.0000 mg | ORAL_TABLET | Freq: Every day | ORAL | 1 refills | Status: DC
Start: 2023-08-05 — End: 2023-12-04

## 2023-08-18 ENCOUNTER — Telehealth: Payer: Self-pay | Admitting: *Deleted

## 2023-08-18 NOTE — Progress Notes (Signed)
 Care Guide Pharmacy Note  08/18/2023 Name: Victor Christensen MRN: [id number] DOB: 1970-04-01  Referred By: Victor Debby CROME, MD Reason for referral: Call Attempt #1 and Complex Care Management (Outreach to schedule referral with pharmacist )   Victor Christensen is a 53 y.o. year old male who is a primary care patient of Victor Debby CROME, MD.  Victor Christensen was referred to the pharmacist for assistance related to: DMII  Successful contact was made with the patient to discuss pharmacy services including being ready for the pharmacist to call at least 5 minutes before the scheduled appointment time and to have medication bottles and any blood pressure readings ready for review. The patient agreed to meet with the pharmacist via telephone visit on 08/29/2023  Victor Christensen, CMA [place]  [facility] Guide Direct Dial: [phone]  Fax: [phone] Website: [email or web address]

## 2023-08-22 ENCOUNTER — Telehealth: Payer: Self-pay | Admitting: Internal Medicine

## 2023-08-22 NOTE — Telephone Encounter (Unsigned)
 Copied from CRM [id number]. Topic: General - Other >> Aug 22, 2023  4:03 PM Gennette ORN wrote: Reason for CRM: [place] from [place] [phone] is calling for a update on the drug therapy alert that was fax over 07/27/23 and 08/05/23 .

## 2023-08-24 ENCOUNTER — Other Ambulatory Visit: Payer: Self-pay | Admitting: Internal Medicine

## 2023-08-24 DIAGNOSIS — E119 Type 2 diabetes mellitus without complications: Secondary | ICD-10-CM

## 2023-08-27 DIAGNOSIS — N3000 Acute cystitis without hematuria: Secondary | ICD-10-CM | POA: Diagnosis not present

## 2023-08-27 NOTE — Telephone Encounter (Signed)
 Advised Victor Christensen That we never received the fax and asked him to refax them. These forms are a recommendation about statin therapy due to the patient having DM, cardiovascular disease or both. They just need a response stating if it's approved or denied from the Provider. If it's being denied they need a reason for the denial.

## 2023-08-29 ENCOUNTER — Other Ambulatory Visit (INDEPENDENT_AMBULATORY_CARE_PROVIDER_SITE_OTHER): Admitting: Pharmacist

## 2023-08-29 DIAGNOSIS — E119 Type 2 diabetes mellitus without complications: Secondary | ICD-10-CM

## 2023-08-29 NOTE — Patient Instructions (Signed)
 It was a pleasure speaking with you today!  Continue metformin  ER 750 mg 2 tablets daily. Work on diet and exercise to help lower blood sugars.  Feel free to call with any questions or concerns!  Darrelyn Drum, PharmD, BCPS, CPP Clinical Pharmacist Practitioner [facility] at [facility] [phone]

## 2023-08-29 NOTE — Progress Notes (Signed)
 08/29/2023 Name: Victor Christensen MRN: [id number] DOB: 07-11-1970  Chief Complaint  Patient presents with   Diabetes   Medication Management    Victor Christensen is a 53 y.o. year old male who presented for a telephone visit.   They were referred to the pharmacist by their PCP for assistance in managing diabetes.    Subjective:  Care Team: Primary Care Provider: Joshua Debby CROME, MD ; Next Scheduled Visit: not scheduled, due January 2026   Medication Access/Adherence  Current Pharmacy:  [id number] [place], [place] - [address] AT [facility] OF [address] [place] [address] Phone: [phone] Fax: [phone]  [facility] - [address] [place] - [address] [place] [address] Phone: [phone] Fax: [phone]   Patient reports affordability concerns with their medications: No  Patient reports access/transportation concerns to their pharmacy: No  Patient reports adherence concerns with their medications:  No     Diabetes:  Current medications: metformin  ER 750 mg 2 tablets daily Medications tried in the past: none  -Was out of metformin  for about a week before PCP appt  Current meal patterns:  - Has been on vacation for the last month so has not been watching his diet - Tends to eat a lot of fruit, snacks on apples   Objective:  Lab Results  Component Value Date   HGBA1C 7.1 (H) 08/04/2023    Lab Results  Component Value Date   CREATININE 1.21 08/04/2023   BUN 13 08/04/2023   NA 135 08/04/2023   K 4.5 08/04/2023   CL 101 08/04/2023   CO2 26 08/04/2023    Lab Results  Component Value Date   CHOL 135 02/20/2023   HDL 40.10 02/20/2023   LDLCALC 70 02/20/2023   TRIG 124.0 02/20/2023   CHOLHDL 3 02/20/2023    Medications Reviewed Today     Reviewed by Victor Christensen, RPH (Pharmacist) on 08/29/23 at 1325  Med List Status: <None>    Medication Order Taking? Sig Documenting Provider Last Dose Status Informant  atorvastatin  (LIPITOR) 10 MG tablet [id number] Yes Take 1 tablet (10 mg total) by mouth daily. Victor Debby CROME, MD  Active   Blood Glucose Monitoring Suppl (CONTOUR NEXT GEN MONITOR) w/Device KIT [id number]  1 Act by Does not apply route 2 (two) times daily. Victor Debby CROME, MD  Active   cyanocobalamin  2000 MCG tablet [id number] Yes Take 1 tablet (2,000 mcg total) by mouth daily. Victor Debby CROME, MD  Active   folic acid  (FOLVITE ) 1 MG tablet [id number] Yes Take 1 tablet (1 mg total) by mouth daily. Victor Debby CROME, MD  Active   glucose blood test strip [id number]  1 each by Other route 2 (two) times daily. Use as instructed Victor Debby CROME, MD  Active   metFORMIN  (GLUCOPHAGE -XR) 750 MG 24 hr tablet [id number] Yes Take 2 tablets (1,500 mg total) by mouth daily with breakfast. Victor Debby CROME, MD  Active               Assessment/Plan:   Diabetes: - Currently uncontrolled, A1c goal <7% - Reviewed long term cardiovascular and renal outcomes of uncontrolled blood sugar - Reviewed goal A1c - Reviewed dietary modifications including increased protein and fiber, balanced meals/snacks - Reviewed lifestyle modifications including: increased movement, strength training - Recommend to continue current regimen  - Reminded to schedule PCP f/u for January    Follow  Up Plan: PRN  Victor Christensen, PharmD, BCPS, CPP Clinical Pharmacist Practitioner [facility] [phone]

## 2023-09-19 ENCOUNTER — Ambulatory Visit
Admission: RE | Admit: 2023-09-19 | Discharge: 2023-09-19 | Disposition: A | Discharge status: Home / Self Care | Source: Ambulatory Visit | Attending: Family Medicine | Admitting: Family Medicine

## 2023-09-19 ENCOUNTER — Other Ambulatory Visit: Payer: Self-pay

## 2023-09-19 VITALS — BP 101/64 | HR 105 | Temp 98.1°F | Resp 16

## 2023-09-19 DIAGNOSIS — G629 Polyneuropathy, unspecified: Secondary | ICD-10-CM

## 2023-09-19 DIAGNOSIS — G360 Neuromyelitis optica [Devic]: Secondary | ICD-10-CM

## 2023-09-19 MED ORDER — GABAPENTIN 100 MG PO CAPS: 100.0000 mg | ORAL_CAPSULE | Freq: Two times a day (BID) | ORAL | 0 refills | Status: DC

## 2023-09-19 NOTE — ED Provider Notes (Signed)
 [facility]    CSN: [id number] Arrival date & time: 09/19/23  1542      History   Chief Complaint Chief Complaint  Patient presents with   Leg Pain    Entered by patient    HPI Victor Christensen is a 53 y.o. male with a past medical history of diabetes, paraplegia, Devic's disease presents for neuropathy.  Patient reports 1 week of a persistent left leg nerve pain.  States it is a tingling pain that comes and goes.  It is worse at night and prevents him from sleeping.  States he has had this before secondary to his Devic's disease.  He had previously been on gabapentin  in the past and it had worked well.  He denies any injury.  He does have a PCP and will be following up next month.  No other concerns at this time.   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   Elevated total protein 02/20/2023   Folate deficiency 04/04/2022   Decubitus ulcer of sacral region, stage 1 09/12/2021   Urinary tract infection 07/09/2021   Encounter for general adult medical examination with abnormal findings 04/05/2021   Dyslipidemia, goal LDL below 100 04/05/2021   Screening for prostate cancer 04/05/2021   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   Vitamin B12 deficiency 07/19/2019   Type 2 diabetes mellitus without complication, without long-term current use of insulin  (HCC) 10/09/2018   Neuromyelitis (HCC) 09/16/2018   ED (erectile dysfunction) 06/01/2013   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/2013   Lumbar spondylosis with myelopathy 05/20/2013   Neurogenic bladder  03/28/2011    Past Surgical History:  Procedure Laterality Date   CYSTOSCOPY W/ URETERAL STENT PLACEMENT Left 07/09/2021   Procedure: CYSTOSCOPY WITH RETROGRADE PYELOGRAM/URETERAL STENT PLACEMENT;  Surgeon: Lovie Arlyss CROME, MD;  Location: [facility];  Service: Urology;  Laterality: Left;   CYSTOSCOPY WITH STENT PLACEMENT Left 01/15/2018   Procedure: CYSTOSCOPY WITH STENT PLACEMENT retrograde pylegram;  Surgeon: Matilda Senior, MD;  Location: [facility];  Service: Urology;  Laterality: Left;   CYSTOSCOPY/URETEROSCOPY/HOLMIUM LASER/STENT PLACEMENT Left 02/05/2018   Procedure: CYSTOSCOPY/URETEROSCOPY/HOLMIUM LASER/STENT EXTRACTION WITH LEFT STENT PLACEMENT;  Surgeon: Matilda Senior, MD;  Location: [facility];  Service: Urology;  Laterality: Left;   CYSTOSCOPY/URETEROSCOPY/HOLMIUM LASER/STENT PLACEMENT Left 07/24/2021   Procedure: LEFT URETEROSCOPY/HOLMIUM LASER/STENT PLACEMENT;  Surgeon: Lovie Arlyss CROME, MD;  Location: [facility];  Service: Urology;  Laterality: Left;  90 MINUTES   EXTRACORPOREAL SHOCK WAVE LITHOTRIPSY Right 09/10/2021   Procedure: EXTRACORPOREAL SHOCK WAVE LITHOTRIPSY (ESWL);  Surgeon: Alvaro Hummer, MD;  Location: [facility];  Service: Urology;  Laterality: Right;       Home Medications    Prior to Admission medications   Medication Sig Start Date End Date Taking? Authorizing Provider  gabapentin  (NEURONTIN ) 100 MG capsule Take 1 capsule (100 mg total) by mouth 2 (two) times daily. 09/19/23 10/19/23 Yes Tyshell Ramberg, Jodi R, NP  atorvastatin  (LIPITOR) 10 MG tablet Take 1 tablet (10 mg total) by mouth daily. 08/05/23  Joshua Debby CROME, MD  Blood Glucose Monitoring Suppl (CONTOUR NEXT GEN MONITOR) w/Device KIT 1 Act by Does not apply route 2 (two) times daily. 08/14/21   Joshua Debby CROME, MD  cyanocobalamin  2000 MCG tablet Take 1 tablet (2,000 mcg total) by mouth daily. 08/05/23   Joshua Debby CROME, MD  folic acid  (FOLVITE ) 1 MG tablet Take 1 tablet (1 mg total) by mouth daily. 08/04/23    Joshua Debby CROME, MD  glucose blood test strip 1 each by Other route 2 (two) times daily. Use as instructed 08/14/21   Joshua Debby CROME, MD  metFORMIN  (GLUCOPHAGE -XR) 750 MG 24 hr tablet Take 2 tablets (1,500 mg total) by mouth daily with breakfast. 08/05/23   Joshua Debby CROME, MD    Family History Family History  Problem Relation Age of Onset   Hypertension Mother    Hypertension Father    Diabetes Father     Social History Social History   Tobacco Use   Smoking status: Never   Smokeless tobacco: Never  Vaping Use   Vaping status: Never Used  Substance Use Topics   Alcohol use: Not Currently   Drug use: Never     Allergies   Duloxetine  and Oxycodone -acetaminophen    Review of Systems Review of Systems  Neurological:  Positive for weakness.       Tingling of left leg     Physical Exam Triage Vital Signs ED Triage Vitals  Encounter Vitals Group     BP 09/19/23 1602 101/64     Girls Systolic BP Percentile --      Girls Diastolic BP Percentile --      Boys Systolic BP Percentile --      Boys Diastolic BP Percentile --      Pulse Rate 09/19/23 1602 (!) 105     Resp 09/19/23 1602 16     Temp 09/19/23 1602 98.1 F (36.7 C)     Temp Source 09/19/23 1602 Oral     SpO2 09/19/23 1602 95 %     Weight --      Height --      Head Circumference --      Peak Flow --      Pain Score 09/19/23 1600 2     Pain Loc --      Pain Education --      Exclude from Growth Chart --    No data found.  Updated Vital Signs BP 101/64   Pulse (!) 105   Temp 98.1 F (36.7 C) (Oral)   Resp 16   SpO2 95%   Visual Acuity Right Eye Distance:   Left Eye Distance:   Bilateral Distance:    Right Eye Near:   Left Eye Near:    Bilateral Near:     Physical Exam Vitals and nursing note reviewed.  Constitutional:      General: He is not in acute distress.    Appearance: He is not ill-appearing.     Comments: Patient in wheelchair  HENT:     Head: Normocephalic and atraumatic.   Eyes:     Pupils: Pupils are equal, round, and reactive to light.  Cardiovascular:     Rate and Rhythm: Normal rate.  Pulmonary:     Effort: Pulmonary effort is normal.  Musculoskeletal:     Right lower leg: No swelling, tenderness or bony tenderness. No edema.     Left lower leg: No swelling, tenderness or bony tenderness. No edema.  Skin:  General: Skin is warm and dry.  Neurological:     General: No focal deficit present.     Mental Status: He is alert and oriented to person, place, and time.     Comments: Patient in wheelchair with paraplegia.  Abnormal sensation and muscle tone secondary to this  Psychiatric:        Mood and Affect: Mood normal.        Behavior: Behavior normal.      UC Treatments / Results  Labs (all labs ordered are listed, but only abnormal results are displayed) Labs Reviewed - No data to display  EKG   Radiology No results found.  Procedures Procedures (including critical care time)  Medications Ordered in UC Medications - No data to display  Initial Impression / Assessment and Plan / UC Course  I have reviewed the triage vital signs and the nursing notes.  Pertinent labs & imaging results that were available during my care of the patient were reviewed by me and considered in my medical decision making (see chart for details).  Clinical Course as of 09/19/23 1645  Fri Sep 19, 2023  1641 HR recheck 98 [JM]    Clinical Course User Index [JM] Loreda Myla SAUNDERS, NP    Reviewed exam and symptoms with patient.  Reports success with gabapentin  for his neuropathy in the past.  Will do low-dose for short period of time and he will need to follow-up with his PCP for any additional refills or doses adjustments and he verbalized understanding of this.  He is to follow-up with his PCP as soon as possible.  ER precautions reviewed Final Clinical Impressions(s) / UC Diagnoses   Final diagnoses:  Devic's disease (HCC)  Neuropathy     Discharge  Instructions      Restart gabapentin  twice daily.  Please follow-up with your PCP as soon as possible for any additional doses of this medication and/or dosage adjustments.  Please go to the ER if you develop any worsening symptoms.  Hope you feel better soon!    ED Prescriptions     Medication Sig Dispense Auth. Provider   gabapentin  (NEURONTIN ) 100 MG capsule Take 1 capsule (100 mg total) by mouth 2 (two) times daily. 60 capsule Emersyn Wyss, Jodi R, NP      PDMP not reviewed this encounter.   Loreda Myla SAUNDERS, NP 09/19/23 [phone]

## 2023-09-19 NOTE — ED Triage Notes (Addendum)
 Pt c/o left legx1wk. Pt denies injury. PT states it's a nerve pain. Pt states the leg is tingling. Pt states the pain is worse at night and wakes him out of his sleep

## 2023-09-19 NOTE — Discharge Instructions (Addendum)
 Restart gabapentin  twice daily.  Please follow-up with your PCP as soon as possible for any additional doses of this medication and/or dosage adjustments.  Please go to the ER if you develop any worsening symptoms.  Hope you feel better soon!

## 2023-10-03 ENCOUNTER — Other Ambulatory Visit: Payer: Self-pay | Admitting: Internal Medicine

## 2023-10-03 DIAGNOSIS — E538 Deficiency of other specified B group vitamins: Secondary | ICD-10-CM

## 2023-10-03 NOTE — Telephone Encounter (Signed)
 Copied from CRM [id number]. Topic: Clinical - Medication Refill >> Oct 03, 2023 10:22 AM Carlyon D wrote: Medication: folic acid  (FOLVITE ) 1 MG tablet  Has the patient contacted their pharmacy? Yes (Agent: If no, request that the patient contact the pharmacy for the refill. If patient does not wish to contact the pharmacy document the reason why and proceed with request.) (Agent: If yes, when and what did the pharmacy advise?)  This is the patient's preferred pharmacy:  [id number] - [place], [place] - [address] AT [facility] OF [address] [place] [address] Phone: [phone] Fax: [phone]   Is this the correct pharmacy for this prescription? Yes If no, delete pharmacy and type the correct one.   Has the prescription been filled recently? No  Is the patient out of the medication? Yes  Has the patient been seen for an appointment in the last year OR does the patient have an upcoming appointment? Yes  Can we respond through MyChart? Yes  Agent: Please be advised that Rx refills may take up to 3 business days. We ask that you follow-up with your pharmacy.

## 2023-10-03 NOTE — Telephone Encounter (Signed)
 Wrong office

## 2023-10-06 ENCOUNTER — Encounter: Payer: Self-pay | Admitting: Neurology

## 2023-10-06 ENCOUNTER — Ambulatory Visit: Admitting: Neurology

## 2023-10-06 VITALS — BP 109/76 | HR 90 | Ht 71.0 in | Wt 225.0 lb

## 2023-10-06 DIAGNOSIS — M79605 Pain in left leg: Secondary | ICD-10-CM | POA: Insufficient documentation

## 2023-10-06 DIAGNOSIS — G369 Acute disseminated demyelination, unspecified: Secondary | ICD-10-CM | POA: Diagnosis not present

## 2023-10-06 DIAGNOSIS — G629 Polyneuropathy, unspecified: Secondary | ICD-10-CM

## 2023-10-06 DIAGNOSIS — D72819 Decreased white blood cell count, unspecified: Secondary | ICD-10-CM | POA: Diagnosis not present

## 2023-10-06 MED ORDER — GABAPENTIN 300 MG PO CAPS
300.0000 mg | ORAL_CAPSULE | Freq: Three times a day (TID) | ORAL | 0 refills | Status: DC
Start: 2023-10-06 — End: 2023-12-03

## 2023-10-06 NOTE — Progress Notes (Unsigned)
 Chief Complaint  Patient presents with   Follow-up    RM 14 Tingling and Numbness      ASSESSMENT AND PLAN  [facility] is a 53 y.o. male   History of neuromyelitis since 2012, involving cervical and thoracic spinal cord  He was on CellCept  500 mg 2 tablets twice a day, due to leukopenia , which did improve after holding CellCept  for a while, was seen by hematologist Dr. Odean, work-up did not reveal etiology, repeat laboratory evaluation continue to show leukopenia, despite has stopped taking CellCept  since June 2022,  No significant worsening of his neurological symptoms since stopped CellCept ,  New onset left knee pain, left lower extremity achy pain paresthesia,  X-ray of left knee, he does have significant tenderness at left medial tibial plateau   Ultrasound of left lower extremity to rule out DVT  Higher dose of gabapentin  300 mg 3 times a day,  If above workup was negative, higher dose of gabapentin  along with Aleve as needed, warm compression fail to improve his left lower extremity symptoms, may consider repeat MRI to rule out flareup of his neuromyelitis   DIAGNOSTIC DATA (LABS, IMAGING, TESTING) - I reviewed patient records, labs, notes, testing and imaging myself where available.   HISTORICAL [facility] is a 53 year old male, accompanied by his wife [facility], seen in request by previous neurologist Dr. 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   I have reviewed and summarized the referring note from the referring physician.  He had a past medical history of diabetes since 2001, taking Metformin    In 2012, he presented with subacute onset of numbness upper and lower extremity, gait abnormality, incontinence, there was no visual loss   MRI of brain was negative, cervical spine with and without contrast showed contrast-enhancing cervical lesions, thoracic spine also showed contrast-enhancing upper cord  edema,   CSF showed WBC of 215, protein of 258, 2 oligoclonal banding, IgG index was mildly elevated 0.7, serum NML antibody was negative, visual evoked potential was negative   He was diagnosed with neuromyelitis, was treated with rituximab 1000 mg IV on June 2012, August 07, 2010, January 2013, August 2013, February 2014, with treatment, he was able to regain some function, able to ambulate with walker, upper extremity strength mostly recovered.   While taking rituximab infusion, he had a few flareups, presented with worsening bilateral lower extremity weakness, sensory changes, eventually lost mobility in April 2014, become wheelchair-bound, he was seen at [facility] in 2014, was put on CellCept  500 mg 2 tablets twice a day ever since 2014.   He had another bouts of flareup in 2016, presented with worsening lower extremity neuropathic pain, He was treated with IV Solu-Medrol in October 2016, followed by plasma exchange in October 2016, with significant improvement of his lower extremity neuropathic pain   He has been on stable dose of CellCept  500 mg 2 tablets twice a day since.   He spent most of the day in wheelchair, has fingertips paresthesia, mild hand grip weakness, but overall upper extremity function well, able to transfer himself in and out of wheelchair, driving with a modified car, keep a bowel regimen, wear depends.   UPDATE Mar 22 2019: Record from [facility] on October 22, 2014, MRI of cervical spine with and without contrast, there is patchy signal abnormality, extends from the cervical medullary junction eccentric towards the left, along the central and dorsal aspect of the cord C2,  3, right dorsal cord C6, and patchy signal abnormality at C7, upper thoracic cord, on the sagittal STIR imagings or thoracic spinal cord, there are diffuse patchy signal abnormality in the cord, and mild diffuse volume loss, there is irregular linear band of enhancement cervical spinal cord  extending from posterior cervical medullary junction to the C6 level, also at T1-T2, T2-T3 level finding appear to progress since most recent MRI cervical spine in 2015, but similar to more remote cervical spine imaging in 2014, no additional enhancing abnormality identified in the remainder of the thoracic spinal cord,Tiny disc protrusion at T3-T4,   I personally reviewed MRI of spines with without contrast in September 2020 There is generalized atrophy of cervical and upper thoracic spine, with patchy signal changes, correlate with remote insult in this patient, Cord: Diffusely small cord measuring only 3-4 mm anterior to posterior at the cervicothoracic junction. Cord also has heterogeneous signal on T2 and STIR imaging, attributed to gliosis from prior demyelination. No expansion/swelling and no abnormal enhancement.   MRI of the brain was normal   Laboratory evaluation in August 2020 showed negative NMO  antibody, mild elevated A1c 7.1, CMP showed mild elevated glucose 122, creatinine of 1.21, normal or negative ANA, HIV, RPR, copper , ferritin, vitamin D  level was 13.7, decreased WBC of 2.9, ESR was mildly elevated 26, normal CPK, TSH, C-reactive protein, B12,   UPDATE July 19 2019: He was noted to have decreased WBC 2.8 in laboratory evaluation, CellCept  was stopped at the end of March 2021, was referred to hematologist,   I was able to review his hematology evaluation by Dr. Odean on May 17, 2019, neutropenia, WBC of 2.8, ANC of 1.2, noticed neutropenia since January 2019, repeat laboratory evaluation showed WBC of 3, ANC was 1.5, B12 was 181, negative ANA, folic acid  6.1, LDH of 150   Based on these laboratory evaluations, B12 deficiency is likely account for his neutropenia and, hematologist Dr.  Gudena  have suggested resume CellCept  if it is necessary   I also talked with his dermatologist Dr. 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, suspicious for cutaneous sarcoidosis, low likelihood of infectious disease, per patient, chest x-ray was negative, culture was negative, he was given steroid cream, which has helped his symptoms   Since CellCept  was stopped in March 2021, most recent CBC was on May 17, 2019, WBC was 3.0,   UPDATE July 5th 2022: He is accompanied by his wife at today's visit, complains of increased right shoulder pain since beginning of 2022, he also noticed significant tenderness at upper biceps region, he cannot clearly identify a trigger event, but he does like to throw  ball using his right hand, he workout his upper extremity regularly, rely on his shoulder and arm to transfer in and out of wheelchair, over the past 6 months, his right shoulder and arm discomfort consistent, he noticed more than right hand numbness tingling, mild right-sided neck pain,  He was given prednisone  tapering dose for 1 week, it does help him some  He is now on CellCept  500 mg 1 tablet twice a day, previously when he was on higher dose 2 tablets twice a day, there was noticeable leukopenia, CellCept  was on hold, he was seen by hematologist, no etiology was found, it was okay per hematology to restart CellCept , which she is now taking 500 mg twice daily since June 2021, he denies significant ill effect  UPDATE Feb 01 2021: He has stopped taking CellCept  given low-dose 500 mg twice a day since last visit in July 2022, no flareup of his monitor his symptoms, right shoulder was diagnosed with right rotator cuff, improved with orthopedic care including intra-articular injection  He is overall doing very well, since stopped taking CellCept , he no longer have body achy pain, not taking gabapentin  anymore,  UPDATE Mar 04 2022: He drove himself to clinic today, using hand driving device, came in wheelchair, he has stopped CellCept  since June 2022, most recent WBC is 2.9, hemoglobin of 12.6,  He is overall stable, exercise  daily, no visual loss, arm still relatively strong, able to control his bowel and bladder during the day, wear depends at nighttime,  UPDATE Sept 8th 2025: Since August, he had recurrent left leg numbness, from knee down, throbbing, woke him up at night, sit up, rubbing it, was given gabapentin  100mg  bid, did not help  A1C 7.1.  He can transfer himself in and out of chair,   PHYSICAL EXAM:   Vitals:   10/06/23 0956  BP: 109/76  Pulse: 90  SpO2: 98%  Weight: 225 lb (102.1 kg)  Height: 5' 11 (1.803 m)    Body mass index is 31.38 kg/m.  PHYSICAL EXAMNIATION:  Gen: NAD, conversant, well nourised, well groomed         NEUROLOGICAL EXAM:  MENTAL STATUS: Speech/cognition Awake, alert, oriented to history taking and casual conversation CRANIAL NERVES: CN II: Visual fields are full to confrontation. Pupils are round equal and briskly reactive to light. CN III, IV, VI: extraocular movement are normal. No ptosis. CN V: Facial sensation is intact to light touch CN VII: Face is symmetric with normal eye closure  CN VIII: Hearing is normal to causal conversation. CN IX, X: Phonation is normal. CN XI: Head turning and shoulder shrug are intact  MOTOR: Mild bilateral shoulder abduction, elbow flexion weakness, tenderness of left medial left knee upon deep palpitation, no spontaneous movement of bilateral lower extremity, tendency for bilateral knee extension, ankle clonus  REFLEXES: Reflexes are 1 and symmetric at the biceps, triceps, 3/3 knees,   SENSORY: Sensory level to navel   COORDINATION: There is no trunk or limb dysmetria noted.  GAIT/STANCE: Deferred  REVIEW OF SYSTEMS:  Full 14 system review of systems performed and notable only for as above All other review of systems were negative.   ALLERGIES: Allergies  Allergen Reactions   Duloxetine  Diarrhea and Other (See Comments)    insomnia   Oxycodone -Acetaminophen  Other (See Comments)    Makes him loopy      HOME MEDICATIONS: Current Outpatient Medications  Medication Sig Dispense Refill   atorvastatin  (LIPITOR) 10 MG tablet Take 1 tablet (10 mg total) by mouth daily. 90 tablet 1   Blood Glucose Monitoring Suppl (CONTOUR NEXT GEN MONITOR) w/Device KIT 1 Act by Does not apply route 2 (two) times daily. 2 kit 0   cyanocobalamin  2000 MCG tablet Take 1 tablet (2,000 mcg total) by mouth daily. 90 tablet 1   folic acid  (FOLVITE ) 1 MG tablet Take 1 tablet (1 mg total) by mouth daily. 90 tablet 1   gabapentin  (NEURONTIN ) 100 MG capsule Take 1 capsule (100 mg total) by mouth 2 (two) times daily. 60 capsule 0   glucose blood test strip 1 each by Other route 2 (two) times daily. Use as instructed 100 each 5   metFORMIN  (GLUCOPHAGE -XR) 750 MG 24 hr tablet Take 2 tablets (1,500 mg total) by mouth daily with  breakfast. 180 tablet 1   No current facility-administered medications for this visit.    PAST MEDICAL HISTORY: Past Medical History:  Diagnosis Date   Anxiety    Diabetes mellitus without complication (HCC)    type 2    History of kidney stones    Neuromyelitis optica (HCC)    Paraplegia (HCC) 2014   per Patient    PAST SURGICAL HISTORY: Past Surgical History:  Procedure Laterality Date   CYSTOSCOPY W/ URETERAL STENT PLACEMENT Left 07/09/2021   Procedure: CYSTOSCOPY WITH RETROGRADE PYELOGRAM/URETERAL STENT PLACEMENT;  Surgeon: Lovie Arlyss CROME, MD;  Location: [facility];  Service: Urology;  Laterality: Left;   CYSTOSCOPY WITH STENT PLACEMENT Left 01/15/2018   Procedure: CYSTOSCOPY WITH STENT PLACEMENT retrograde pylegram;  Surgeon: Matilda Senior, MD;  Location: [facility];  Service: Urology;  Laterality: Left;   CYSTOSCOPY/URETEROSCOPY/HOLMIUM LASER/STENT PLACEMENT Left 02/05/2018   Procedure: CYSTOSCOPY/URETEROSCOPY/HOLMIUM LASER/STENT EXTRACTION WITH LEFT STENT PLACEMENT;  Surgeon: Matilda Senior, MD;  Location: [facility];  Service: Urology;  Laterality: Left;    CYSTOSCOPY/URETEROSCOPY/HOLMIUM LASER/STENT PLACEMENT Left 07/24/2021   Procedure: LEFT URETEROSCOPY/HOLMIUM LASER/STENT PLACEMENT;  Surgeon: Lovie Arlyss CROME, MD;  Location: [facility];  Service: Urology;  Laterality: Left;  90 MINUTES   EXTRACORPOREAL SHOCK WAVE LITHOTRIPSY Right 09/10/2021   Procedure: EXTRACORPOREAL SHOCK WAVE LITHOTRIPSY (ESWL);  Surgeon: Alvaro Hummer, MD;  Location: [facility];  Service: Urology;  Laterality: Right;    FAMILY HISTORY: Family History  Problem Relation Age of Onset   Hypertension Mother    Hypertension Father    Diabetes Father     SOCIAL HISTORY: Social History   Socioeconomic History   Marital status: Married    Spouse name: Not on file   Number of children: 2   Years of education: college   Highest education level: Bachelor's degree (e.g., BA, AB, BS)  Occupational History   Occupation: Disabled  Tobacco Use   Smoking status: Never   Smokeless tobacco: Never  Vaping Use   Vaping status: Never Used  Substance and Sexual Activity   Alcohol use: Not Currently   Drug use: Never   Sexual activity: Yes  Other Topics Concern   Not on file  Social History Narrative   Lives at home with his wife and son.   Right-handed.   Moderate caffeine use.   Bachelor's Degree   Social Drivers of Corporate investment banker Strain: Low Risk  (07/31/2023)   Overall Financial Resource Strain (CARDIA)    Difficulty of Paying Living Expenses: Not very hard  Food Insecurity: No Food Insecurity (07/31/2023)   Hunger Vital Sign    Worried About Running Out of Food in the Last Year: Never true    Ran Out of Food in the Last Year: Never true  Transportation Needs: No Transportation Needs (07/31/2023)   PRAPARE - Administrator, Civil Service (Medical): No    Lack of Transportation (Non-Medical): No  Physical Activity: Insufficiently Active (07/31/2023)   Exercise Vital Sign    Days of Exercise per Week: 3 days    Minutes of Exercise  per Session: 30 min  Stress: No Stress Concern Present (07/31/2023)   [employer] of Occupational Health - Occupational Stress Questionnaire    Feeling of Stress: Not at all  Social Connections: Moderately Integrated (07/31/2023)   Social Connection and Isolation Panel    Frequency of Communication with Friends and Family: More than three times a week    Frequency of Social Gatherings with Friends  and Family: Once a week    Attends Religious Services: Never    Active Member of Clubs or Organizations: Yes    Attends Engineer, structural: More than 4 times per year    Marital Status: Married  Catering manager Violence: Not At Risk (07/31/2023)   Humiliation, Afraid, Rape, and Kick questionnaire    Fear of Current or Ex-Partner: No    Emotionally Abused: No    Physically Abused: No    Sexually Abused: No      Modena Callander, M.D. Ph.D.  [facility] [address] [place], [place] [address] Ph: [phone] Fax: [phone]  CC:  Joshua Debby CROME, MD [address] [place],  [place] [address]  Joshua Debby CROME, MD

## 2023-10-08 ENCOUNTER — Ambulatory Visit
Admission: RE | Admit: 2023-10-08 | Discharge: 2023-10-08 | Disposition: A | Discharge status: Home / Self Care | Source: Ambulatory Visit | Attending: Neurology | Admitting: Neurology

## 2023-10-08 DIAGNOSIS — M79605 Pain in left leg: Secondary | ICD-10-CM

## 2023-10-08 DIAGNOSIS — D72819 Decreased white blood cell count, unspecified: Secondary | ICD-10-CM

## 2023-10-08 DIAGNOSIS — M25562 Pain in left knee: Secondary | ICD-10-CM | POA: Diagnosis not present

## 2023-10-08 DIAGNOSIS — G369 Acute disseminated demyelination, unspecified: Secondary | ICD-10-CM

## 2023-10-08 MED ORDER — FOLIC ACID 1 MG PO TABS: 1.0000 mg | ORAL_TABLET | Freq: Every day | ORAL | 1 refills | Status: AC

## 2023-10-09 ENCOUNTER — Ambulatory Visit: Payer: Self-pay | Admitting: Neurology

## 2023-10-10 ENCOUNTER — Ambulatory Visit (HOSPITAL_COMMUNITY)
Admission: RE | Admit: 2023-10-10 | Discharge: 2023-10-10 | Disposition: A | Discharge status: Home / Self Care | Source: Ambulatory Visit | Attending: Neurology | Admitting: Neurology

## 2023-10-10 DIAGNOSIS — M79605 Pain in left leg: Secondary | ICD-10-CM | POA: Insufficient documentation

## 2023-10-10 DIAGNOSIS — G369 Acute disseminated demyelination, unspecified: Secondary | ICD-10-CM | POA: Diagnosis not present

## 2023-10-10 DIAGNOSIS — D72819 Decreased white blood cell count, unspecified: Secondary | ICD-10-CM | POA: Diagnosis not present

## 2023-10-10 NOTE — Progress Notes (Signed)
 Left lower extremity venous duplex has been completed. Preliminary results can be found in CV Proc through chart review.   10/10/23 10:01 AM Cathlyn Collet RVT

## 2023-10-16 ENCOUNTER — Ambulatory Visit: Admitting: Neurology

## 2023-10-24 ENCOUNTER — Ambulatory Visit
Admission: EM | Admit: 2023-10-24 | Discharge: 2023-10-24 | Disposition: A | Discharge status: Home / Self Care | Attending: Family Medicine | Admitting: Family Medicine

## 2023-10-24 DIAGNOSIS — R8290 Unspecified abnormal findings in urine: Secondary | ICD-10-CM | POA: Diagnosis not present

## 2023-10-24 DIAGNOSIS — N319 Neuromuscular dysfunction of bladder, unspecified: Secondary | ICD-10-CM | POA: Diagnosis not present

## 2023-10-24 DIAGNOSIS — N390 Urinary tract infection, site not specified: Secondary | ICD-10-CM | POA: Diagnosis not present

## 2023-10-24 LAB — POCT URINE DIPSTICK
Bilirubin, UA: NEGATIVE
Glucose, UA: NEGATIVE mg/dL
Ketones, POC UA: NEGATIVE mg/dL
Nitrite, UA: NEGATIVE
POC PROTEIN,UA: NEGATIVE
Spec Grav, UA: <=1.005 — AB (ref 1.010–1.025)
Urobilinogen, UA: 0.2 U/dL
pH, UA: 5.5 (ref 5.0–8.0)

## 2023-10-24 MED ORDER — CEPHALEXIN 500 MG PO CAPS: 500.0000 mg | ORAL_CAPSULE | Freq: Three times a day (TID) | ORAL | 0 refills | Status: DC

## 2023-10-24 NOTE — ED Triage Notes (Signed)
 Pt reports cloudy urine x 3 days. Pt has not taken any meds for complaint.

## 2023-10-24 NOTE — Discharge Instructions (Signed)
 Please start cephalexin  to address an urinary tract infection. Make sure you hydrate very well with plain water and a quantity of 64 ounces of water a day.  Please limit drinks that are considered urinary irritants such as fruit juices, soda, sweet tea, coffee, artifical sweetened drinks, energy drinks, alcohol.  These can worsen your urinary and genital symptoms but also be the source of them.  I will let you know about your urine culture results through MyChart to see if we need to prescribe or change your antibiotics based off of those results.

## 2023-10-24 NOTE — ED Provider Notes (Signed)
 [facility]  Note:  This document was prepared using Conservation officer, historic buildings and may include unintentional dictation errors.  MRN: [id number] DOB: 08-05-1970  Subjective:   [facility] is a 53 y.o. male with pain which of urinary tract infections, neurogenic bladder, wheelchair dependence presenting for 3-day history of cloudy urine.  Denies dysuria, hematuria, urinary frequency, penile discharge, penile swelling, testicular pain, testicular swelling, anal pain, groin pain.  Does not do any urinary catheterization.  No current facility-administered medications for this encounter.  Current Outpatient Medications:    atorvastatin  (LIPITOR) 10 MG tablet, Take 1 tablet (10 mg total) by mouth daily., Disp: 90 tablet, Rfl: 1   Blood Glucose Monitoring Suppl (CONTOUR NEXT GEN MONITOR) w/Device KIT, 1 Act by Does not apply route 2 (two) times daily., Disp: 2 kit, Rfl: 0   cyanocobalamin  2000 MCG tablet, Take 1 tablet (2,000 mcg total) by mouth daily., Disp: 90 tablet, Rfl: 1   folic acid  (FOLVITE ) 1 MG tablet, Take 1 tablet (1 mg total) by mouth daily., Disp: 90 tablet, Rfl: 1   gabapentin  (NEURONTIN ) 300 MG capsule, Take 1 capsule (300 mg total) by mouth 3 (three) times daily., Disp: 90 capsule, Rfl: 0   glucose blood test strip, 1 each by Other route 2 (two) times daily. Use as instructed, Disp: 100 each, Rfl: 5   metFORMIN  (GLUCOPHAGE -XR) 750 MG 24 hr tablet, Take 2 tablets (1,500 mg total) by mouth daily with breakfast., Disp: 180 tablet, Rfl: 1   Allergies  Allergen Reactions   Duloxetine  Diarrhea and Other (See Comments)    insomnia   Oxycodone -Acetaminophen  Other (See Comments)    Makes him loopy     Past Medical History:  Diagnosis Date   Anxiety    Diabetes mellitus without complication (HCC)    type 2    History of kidney stones    Neuromyelitis optica (HCC)    Paraplegia (HCC) 2014   per Patient     Past Surgical History:  Procedure  Laterality Date   CYSTOSCOPY W/ URETERAL STENT PLACEMENT Left 07/09/2021   Procedure: CYSTOSCOPY WITH RETROGRADE PYELOGRAM/URETERAL STENT PLACEMENT;  Surgeon: Lovie Arlyss CROME, MD;  Location: [facility];  Service: Urology;  Laterality: Left;   CYSTOSCOPY WITH STENT PLACEMENT Left 01/15/2018   Procedure: CYSTOSCOPY WITH STENT PLACEMENT retrograde pylegram;  Surgeon: Matilda Senior, MD;  Location: [facility];  Service: Urology;  Laterality: Left;   CYSTOSCOPY/URETEROSCOPY/HOLMIUM LASER/STENT PLACEMENT Left 02/05/2018   Procedure: CYSTOSCOPY/URETEROSCOPY/HOLMIUM LASER/STENT EXTRACTION WITH LEFT STENT PLACEMENT;  Surgeon: Matilda Senior, MD;  Location: [facility];  Service: Urology;  Laterality: Left;   CYSTOSCOPY/URETEROSCOPY/HOLMIUM LASER/STENT PLACEMENT Left 07/24/2021   Procedure: LEFT URETEROSCOPY/HOLMIUM LASER/STENT PLACEMENT;  Surgeon: Lovie Arlyss CROME, MD;  Location: [facility];  Service: Urology;  Laterality: Left;  90 MINUTES   EXTRACORPOREAL SHOCK WAVE LITHOTRIPSY Right 09/10/2021   Procedure: EXTRACORPOREAL SHOCK WAVE LITHOTRIPSY (ESWL);  Surgeon: Alvaro Hummer, MD;  Location: [facility];  Service: Urology;  Laterality: Right;    Family History  Problem Relation Age of Onset   Hypertension Mother    Hypertension Father    Diabetes Father     Social History   Tobacco Use   Smoking status: Never   Smokeless tobacco: Never  Vaping Use   Vaping status: Never Used  Substance Use Topics   Alcohol use: Not Currently   Drug use: Never    ROS   Objective:   Vitals: BP 106/71 (BP Location: Left Arm)  Pulse 97   Temp 98.8 F (37.1 C) (Oral)   Resp 16   SpO2 97%   Physical Exam Constitutional:      General: He is not in acute distress.    Appearance: Normal appearance. He is well-developed and normal weight. He is not ill-appearing, toxic-appearing or diaphoretic.  HENT:     Head: Normocephalic and atraumatic.     Right Ear: External ear normal.     Left Ear:  External ear normal.     Nose: Nose normal.     Mouth/Throat:     Pharynx: Oropharynx is clear.  Eyes:     General: No scleral icterus.       Right eye: No discharge.        Left eye: No discharge.     Extraocular Movements: Extraocular movements intact.  Cardiovascular:     Rate and Rhythm: Normal rate.  Pulmonary:     Effort: Pulmonary effort is normal.  Musculoskeletal:     Cervical back: Normal range of motion.  Neurological:     Mental Status: He is alert and oriented to person, place, and time.  Psychiatric:        Mood and Affect: Mood normal.        Behavior: Behavior normal.        Thought Content: Thought content normal.        Judgment: Judgment normal.     Results for orders placed or performed during the hospital encounter of 10/24/23 (from the past 24 hours)  POCT URINE DIPSTICK     Status: Abnormal   Collection Time: 10/24/23 12:00 PM  Result Value Ref Range   Color, UA yellow yellow   Clarity, UA cloudy (A) clear   Glucose, UA negative negative mg/dL   Bilirubin, UA negative negative   Ketones, POC UA negative negative mg/dL   Spec Grav, UA <=8.994 (A) 1.010 - 1.025   Blood, UA trace-intact (A) negative   pH, UA 5.5 5.0 - 8.0   POC PROTEIN,UA negative negative, trace   Urobilinogen, UA 0.2 0.2 or 1.0 E.U./dL   Nitrite, UA Negative Negative   Leukocytes, UA Small (1+) (A) Negative    Assessment and Plan :   PDMP not reviewed this encounter.  1. Recurrent UTI   2. Cloudy urine   3. Neurogenic bladder    Start cephalexin  to cover for acute recurrent cystitis, urine culture pending.  Recommended aggressive hydration, limiting urinary irritants. Counseled patient on potential for adverse effects with medications prescribed/recommended today, ER and return-to-clinic precautions discussed, patient verbalized understanding.    Christopher [place], [place] 10/24/23 1207

## 2023-10-26 LAB — URINE CULTURE: Culture: 50000 — AB

## 2023-10-27 ENCOUNTER — Ambulatory Visit (HOSPITAL_COMMUNITY): Payer: Self-pay

## 2023-10-28 MED ORDER — CIPROFLOXACIN HCL 500 MG PO TABS: 500.0000 mg | ORAL_TABLET | Freq: Two times a day (BID) | ORAL | 0 refills | Status: DC

## 2023-11-13 ENCOUNTER — Inpatient Hospital Stay

## 2023-11-13 ENCOUNTER — Other Ambulatory Visit: Payer: Self-pay | Admitting: *Deleted

## 2023-11-13 ENCOUNTER — Inpatient Hospital Stay: Payer: Medicare PPO | Attending: Hematology and Oncology | Admitting: Hematology and Oncology

## 2023-11-13 VITALS — BP 124/86 | HR 91 | Temp 98.0°F | Resp 18 | Ht 71.0 in

## 2023-11-13 DIAGNOSIS — D72819 Decreased white blood cell count, unspecified: Secondary | ICD-10-CM

## 2023-11-13 DIAGNOSIS — N390 Urinary tract infection, site not specified: Secondary | ICD-10-CM | POA: Diagnosis not present

## 2023-11-13 DIAGNOSIS — Z87442 Personal history of urinary calculi: Secondary | ICD-10-CM | POA: Insufficient documentation

## 2023-11-13 DIAGNOSIS — Z79899 Other long term (current) drug therapy: Secondary | ICD-10-CM | POA: Insufficient documentation

## 2023-11-13 DIAGNOSIS — Z7984 Long term (current) use of oral hypoglycemic drugs: Secondary | ICD-10-CM | POA: Diagnosis not present

## 2023-11-13 DIAGNOSIS — D709 Neutropenia, unspecified: Secondary | ICD-10-CM | POA: Diagnosis not present

## 2023-11-13 LAB — CBC WITH DIFFERENTIAL (CANCER CENTER ONLY)
Abs Immature Granulocytes: 0.02 K/uL (ref 0.00–0.07)
Basophils Absolute: 0 K/uL (ref 0.0–0.1)
Basophils Relative: 1 %
Eosinophils Absolute: 0.2 K/uL (ref 0.0–0.5)
Eosinophils Relative: 8 %
HCT: 39.4 % (ref 39.0–52.0)
Hemoglobin: 12.6 g/dL — ABNORMAL LOW (ref 13.0–17.0)
Immature Granulocytes: 1 %
Lymphocytes Relative: 26 %
Lymphs Abs: 0.8 K/uL (ref 0.7–4.0)
MCH: 25 pg — ABNORMAL LOW (ref 26.0–34.0)
MCHC: 32 g/dL (ref 30.0–36.0)
MCV: 78.2 fL — ABNORMAL LOW (ref 80.0–100.0)
Monocytes Absolute: 0.5 K/uL (ref 0.1–1.0)
Monocytes Relative: 15 %
Neutro Abs: 1.5 K/uL — ABNORMAL LOW (ref 1.7–7.7)
Neutrophils Relative %: 49 %
Platelet Count: 258 K/uL (ref 150–400)
RBC: 5.04 MIL/uL (ref 4.22–5.81)
RDW: 14.7 % (ref 11.5–15.5)
WBC Count: 3.1 K/uL — ABNORMAL LOW (ref 4.0–10.5)
nRBC: 0 % (ref 0.0–0.2)

## 2023-11-13 NOTE — Assessment & Plan Note (Signed)
 Neutropenia started 02/05/2018.  He was previously in [place]  and moved to [place] for his wife. Lab review: 05/17/2019: WBC 3, hemoglobin 13.4, ANC 1.5 (it was 1.2 on 04/19/2019) B12: 181 ANA: Negative Folic acid : 6.1, LDH: 150 09/06/2019: WBC 4.5, hemoglobin 12.6, platelets 277 03/09/19:WBC: 3.9, Hb 12.5, ANC 2.2 08/01/2020: WBC 2.1, hemoglobin 12.6, platelets 219, ANC 1 11/06/2020: WBC 2.2, hemoglobin 12.6, ANC 1.1, platelets 185 11/06/2021: WBC 2.9, hemoglobin 12.6, platelets 235, ANC 1.5 08/12/2022: WBC 3, hemoglobin 12.6, platelets 237, ANC 1.6 11/07/2022: WBC 3.1, hemoglobin 12.9, ANC 1.6, platelets 249 08/04/2023: WBC 3.1, hemoglobin 12.7, platelets 249, ANC 1.6   Labs appear to be relatively stable. (His white blood cell count appears to go up periodically especially when he had kidney stones: This is a good sign that he can increase his counts as needed)   Current treatment: B12 supplementation 1000 mcg sublingually daily Follow-up in 1 year with labs

## 2023-11-13 NOTE — Progress Notes (Signed)
 Patient Care Team: Joshua Debby CROME, MD as PCP - General (Internal Medicine) Odean Potts, MD as Consulting Physician (Hematology and Oncology)  DIAGNOSIS:  Encounter Diagnosis  Name Primary?   Leukopenia, unspecified type Yes   CHIEF COMPLIANT: Follow-up of chronic leukopenia  HISTORY OF PRESENT ILLNESS:  History of Present Illness Victor Christensen is a 53 year old male who presents with history of chronic leukopenia.  Recently went to the emergency room with UTI .  He has a history of kidney stones. Recent blood work shows a neutrophil count of 1.5, consistent with previous years. White blood cell count, hemoglobin, and platelets are similar to previous years.     ALLERGIES:  is allergic to duloxetine  and oxycodone -acetaminophen .  MEDICATIONS:  Current Outpatient Medications  Medication Sig Dispense Refill   atorvastatin  (LIPITOR) 10 MG tablet Take 1 tablet (10 mg total) by mouth daily. 90 tablet 1   Blood Glucose Monitoring Suppl (CONTOUR NEXT GEN MONITOR) w/Device KIT 1 Act by Does not apply route 2 (two) times daily. 2 kit 0   cephALEXin  (KEFLEX ) 500 MG capsule Take 1 capsule (500 mg total) by mouth 3 (three) times daily. 21 capsule 0   ciprofloxacin  (CIPRO ) 500 MG tablet Take 1 tablet (500 mg total) by mouth every 12 (twelve) hours. 10 tablet 0   cyanocobalamin  2000 MCG tablet Take 1 tablet (2,000 mcg total) by mouth daily. 90 tablet 1   folic acid  (FOLVITE ) 1 MG tablet Take 1 tablet (1 mg total) by mouth daily. 90 tablet 1   gabapentin  (NEURONTIN ) 300 MG capsule Take 1 capsule (300 mg total) by mouth 3 (three) times daily. 90 capsule 0   glucose blood test strip 1 each by Other route 2 (two) times daily. Use as instructed 100 each 5   metFORMIN  (GLUCOPHAGE -XR) 750 MG 24 hr tablet Take 2 tablets (1,500 mg total) by mouth daily with breakfast. 180 tablet 1   No current facility-administered medications for this visit.    PHYSICAL EXAMINATION: ECOG PERFORMANCE STATUS: 1 -  Symptomatic but completely ambulatory  Vitals:   11/13/23 0929  BP: 124/86  Pulse: 91  Resp: 18  Temp: 98 F (36.7 C)  SpO2: 100%   Filed Weights    Physical Exam   (exam performed in the presence of a chaperone)  LABORATORY DATA:  I have reviewed the data as listed    Latest Ref Rng & Units 08/04/2023    3:36 PM 02/20/2023   11:05 AM 04/04/2022    9:19 AM  CMP  Glucose 70 - 99 mg/dL 80   99   BUN 6 - 23 mg/dL 13   16   Creatinine 9.59 - 1.50 mg/dL 8.78   8.76   Sodium 864 - 145 mEq/L 135   136   Potassium 3.5 - 5.1 mEq/L 4.5   4.9   Chloride 96 - 112 mEq/L 101   100   CO2 19 - 32 mEq/L 26   29   Calcium  8.4 - 10.5 mg/dL 9.8   89.9   Total Protein 6.0 - 8.3 g/dL  8.5  7.5   Total Bilirubin 0.2 - 1.2 mg/dL  0.5  0.4   Alkaline Phos 39 - 117 U/L  56  47   AST 0 - 37 U/L  18  13   ALT 0 - 53 U/L  19  12     Lab Results  Component Value Date   WBC 3.1 (L) 11/13/2023   HGB 12.6 (L)  11/13/2023   HCT 39.4 11/13/2023   MCV 78.2 (L) 11/13/2023   PLT 258 11/13/2023   NEUTROABS 1.5 (L) 11/13/2023    ASSESSMENT & PLAN:  Leukopenia Neutropenia started 02/05/2018.  He was previously in [place]  and moved to [place] for his wife. Lab review: 05/17/2019: WBC 3, hemoglobin 13.4, ANC 1.5 (it was 1.2 on 04/19/2019) B12: 181 ANA: Negative Folic acid : 6.1, LDH: 150 09/06/2019: WBC 4.5, hemoglobin 12.6, platelets 277 03/09/19:WBC: 3.9, Hb 12.5, ANC 2.2 08/01/2020: WBC 2.1, hemoglobin 12.6, platelets 219, ANC 1 11/06/2020: WBC 2.2, hemoglobin 12.6, ANC 1.1, platelets 185 11/06/2021: WBC 2.9, hemoglobin 12.6, platelets 235, ANC 1.5 08/12/2022: WBC 3, hemoglobin 12.6, platelets 237, ANC 1.6 11/07/2022: WBC 3.1, hemoglobin 12.9, ANC 1.6, platelets 249 08/04/2023: WBC 3.1, hemoglobin 12.7, platelets 249, ANC 1.6   10/24/2023: UTI (ER) every 10 ladies inspiring room and topics like: Has displacement inspiring women that not be good I guess they asked me to give it all just wondering what have  for strangely I am doing the talk so I have no idea what she is here could be yeah so you got okay and it is nothing chattering but it will be frontal Labs appear to be relatively stable. (His white blood cell count appears to go up periodically especially when he had kidney stones: This is a good sign that he can increase his counts as needed)   Current treatment: B12 supplementation 1000 mcg sublingually daily Follow-up in 1 year with labs ------------------------------------- Assessment and Plan Assessment & Plan Chronic leukopenia Chronic leukopenia with stable white blood cell count at 3.0, stable hemoglobin at 12, normal platelet count, and stable neutrophil count at 1.5. No serious hematological conditions identified. - Review blood counts annually.      Orders Placed This Encounter  Procedures   CBC with Differential (Cancer Center Only)    Standing Status:   Future    Expiration Date:   11/12/2024   The patient has a good understanding of the overall plan. he agrees with it. he will call with any problems that may develop before the next visit here.  I personally spent a total of 30 minutes in the care of the patient today including preparing to see the patient, getting/reviewing separately obtained history, performing a medically appropriate exam/evaluation, counseling and educating, placing orders, referring and communicating with other health care professionals, documenting clinical information in the EHR, independently interpreting results, communicating results, and coordinating care.   Viinay K Jamon [place], MD 11/13/23

## 2023-11-14 DIAGNOSIS — N200 Calculus of kidney: Secondary | ICD-10-CM | POA: Diagnosis not present

## 2023-11-26 ENCOUNTER — Telehealth: Payer: Self-pay | Admitting: Neurology

## 2023-11-26 NOTE — Telephone Encounter (Signed)
 Elenor from [facility] called stated Pt came in with  referral for Wheelchair cushion However [employer] is requesting a Prescription order be faxed over to their officer for Pt wheelchair cushion with  all  Pt information as well.  CALLBACK [phone]  FAX # [phone]

## 2023-11-27 NOTE — Telephone Encounter (Signed)
 Noted

## 2023-11-27 NOTE — Telephone Encounter (Signed)
 Phone room: please call them back. Looks like PCP Cathy Molt) is the one who wrote order for wheelchair originally and that is who they need to call about this.

## 2023-12-03 ENCOUNTER — Ambulatory Visit (INDEPENDENT_AMBULATORY_CARE_PROVIDER_SITE_OTHER): Admitting: Internal Medicine

## 2023-12-03 VITALS — BP 106/78 | HR 90 | Temp 98.2°F | Resp 16 | Ht 71.0 in | Wt 224.0 lb

## 2023-12-03 DIAGNOSIS — G369 Acute disseminated demyelination, unspecified: Secondary | ICD-10-CM

## 2023-12-03 DIAGNOSIS — G360 Neuromyelitis optica [Devic]: Secondary | ICD-10-CM | POA: Diagnosis not present

## 2023-12-03 DIAGNOSIS — Z7984 Long term (current) use of oral hypoglycemic drugs: Secondary | ICD-10-CM

## 2023-12-03 DIAGNOSIS — E1140 Type 2 diabetes mellitus with diabetic neuropathy, unspecified: Secondary | ICD-10-CM

## 2023-12-03 DIAGNOSIS — E785 Hyperlipidemia, unspecified: Secondary | ICD-10-CM

## 2023-12-03 DIAGNOSIS — E538 Deficiency of other specified B group vitamins: Secondary | ICD-10-CM

## 2023-12-03 LAB — LIPID PANEL
Cholesterol: 97 mg/dL (ref 0–200)
HDL: 33.6 mg/dL — ABNORMAL LOW (ref >39.00)
LDL Cholesterol: 42 mg/dL (ref 0–99)
NonHDL: 63.7
Total CHOL/HDL Ratio: 3
Triglycerides: 111 mg/dL (ref 0.0–149.0)
VLDL: 22.2 mg/dL (ref 0.0–40.0)

## 2023-12-03 LAB — HEMOGLOBIN A1C: Hgb A1c MFr Bld: 7.8 % — ABNORMAL HIGH (ref 4.6–6.5)

## 2023-12-03 MED ORDER — GABAPENTIN 300 MG PO CAPS: 300.0000 mg | ORAL_CAPSULE | Freq: Three times a day (TID) | ORAL | 1 refills | Status: AC

## 2023-12-03 NOTE — Patient Instructions (Signed)

## 2023-12-03 NOTE — Progress Notes (Signed)
 "  Subjective:  Patient ID: Victor Christensen, male    DOB: 1970-10-15  Age: 53 y.o. MRN: [id number]  CC: Anemia, Hyperlipidemia, and Diabetes   HPI Starsky Nanna presents for f/up -  Discussed the use of AI scribe software for clinical note transcription with the patient, who gave verbal consent to proceed.  History of Present Illness Victor Christensen is a 53 year old male who presents for a prescription for wheelchair repair.  He requires a prescription for wheelchair repair due to issues with the tires, which have slit and gone out. He has a referral for this but needs the prescription to proceed with the repair.  He experiences tingling and numbness in his leg. The patient reports that an x-ray was done on his leg and he thinks it was related to bone density, but he is not certain. He is currently taking gabapentin  300 mg three times a day to manage these symptoms, a regimen started after a consultation in September.  His blood pressure typically remains around 100/84 and does not cause any issues. No weakness, dizziness, lightheadedness, or swelling in his legs or feet.  He is not currently receiving B12 injections but takes B12 over the counter. He recalls having his blood counts checked by a specialist at [facility].     Outpatient Medications Prior to Visit  Medication Sig Dispense Refill   Blood Glucose Monitoring Suppl (CONTOUR NEXT GEN MONITOR) w/Device KIT 1 Act by Does not apply route 2 (two) times daily. 2 kit 0   folic acid  (FOLVITE ) 1 MG tablet Take 1 tablet (1 mg total) by mouth daily. 90 tablet 1   glucose blood test strip 1 each by Other route 2 (two) times daily. Use as instructed 100 each 5   atorvastatin  (LIPITOR) 10 MG tablet Take 1 tablet (10 mg total) by mouth daily. 90 tablet 1   cephALEXin  (KEFLEX ) 500 MG capsule Take 1 capsule (500 mg total) by mouth 3 (three) times daily. 21 capsule 0   ciprofloxacin  (CIPRO ) 500 MG tablet Take 1 tablet (500 mg total) by  mouth every 12 (twelve) hours. 10 tablet 0   cyanocobalamin  2000 MCG tablet Take 1 tablet (2,000 mcg total) by mouth daily. 90 tablet 1   gabapentin  (NEURONTIN ) 300 MG capsule Take 1 capsule (300 mg total) by mouth 3 (three) times daily. 90 capsule 0   metFORMIN  (GLUCOPHAGE -XR) 750 MG 24 hr tablet Take 2 tablets (1,500 mg total) by mouth daily with breakfast. 180 tablet 1   No facility-administered medications prior to visit.    ROS Review of Systems  Constitutional:  Negative for appetite change, chills, diaphoresis, fatigue and fever.  HENT: Negative.    Eyes: Negative.   Respiratory: Negative.  Negative for cough, chest tightness, shortness of breath and wheezing.   Cardiovascular:  Negative for chest pain, palpitations and leg swelling.  Gastrointestinal:  Negative for abdominal pain, constipation, diarrhea, nausea and vomiting.  Genitourinary: Negative.  Negative for dysuria.  Musculoskeletal: Negative.   Skin: Negative.   Neurological:  Positive for weakness and numbness. Negative for dizziness and light-headedness.  Hematological:  Negative for adenopathy. Does not bruise/bleed easily.  Psychiatric/Behavioral: Negative.      Objective:  BP 106/78 (BP Location: Left Arm, Patient Position: Sitting, Cuff Size: Normal)   Pulse 90   Temp 98.2 F (36.8 C) (Oral)   Resp 16   Ht 5' 11 (1.803 m)   Wt 224 lb (101.6 kg)   SpO2 97%  BMI 31.24 kg/m   BP Readings from Last 3 Encounters:  12/03/23 106/78  11/13/23 124/86  10/24/23 106/71    Wt Readings from Last 3 Encounters:  12/03/23 224 lb (101.6 kg)  10/06/23 225 lb (102.1 kg)  08/04/23 215 lb (97.5 kg)    Physical Exam Vitals reviewed.  HENT:     Nose: Nose normal.     Mouth/Throat:     Mouth: Mucous membranes are moist.  Eyes:     General: No scleral icterus.    Conjunctiva/sclera: Conjunctivae normal.  Cardiovascular:     Rate and Rhythm: Normal rate and regular rhythm.     Heart sounds: No murmur heard.     No friction rub. No gallop.  Pulmonary:     Effort: Pulmonary effort is normal.     Breath sounds: No stridor. No wheezing, rhonchi or rales.  Abdominal:     General: Abdomen is flat.     Palpations: There is no mass.     Tenderness: There is no abdominal tenderness. There is no guarding.     Hernia: No hernia is present.  Musculoskeletal:        General: Normal range of motion.     Cervical back: Neck supple.     Right lower leg: No edema.     Left lower leg: No edema.  Lymphadenopathy:     Cervical: No cervical adenopathy.  Skin:    General: Skin is warm.  Neurological:     Mental Status: He is alert. Mental status is at baseline.  Psychiatric:        Mood and Affect: Mood normal.        Behavior: Behavior normal.     Lab Results  Component Value Date   WBC 3.1 (L) 11/13/2023   HGB 12.6 (L) 11/13/2023   HCT 39.4 11/13/2023   PLT 258 11/13/2023   GLUCOSE 80 08/04/2023   CHOL 97 12/03/2023   TRIG 111.0 12/03/2023   HDL 33.60 (L) 12/03/2023   LDLCALC 42 12/03/2023   ALT 19 02/20/2023   AST 18 02/20/2023   NA 135 08/04/2023   K 4.5 08/04/2023   CL 101 08/04/2023   CREATININE 1.21 08/04/2023   BUN 13 08/04/2023   CO2 26 08/04/2023   TSH 2.18 08/04/2023   PSA 0.41 08/04/2023   HGBA1C 7.8 (H) 12/03/2023   MICROALBUR 1.0 08/04/2023    No results found.  Assessment & Plan:  Neuromyelitis (HCC) -     Gabapentin ; Take 1 capsule (300 mg total) by mouth 3 (three) times daily.  Dispense: 270 capsule; Refill: 1 -     For home use only DME Other see comment  Folate deficiency -     Folate; Future  B12 deficiency -     Vitamin B12; Future -     Folate; Future  Type 2 diabetes mellitus with diabetic neuropathy, without long-term current use of insulin  (HCC)- His A1C is up to 7.8%, will add a GLP-1 agonist. -     Hemoglobin A1c; Future -     metFORMIN  HCl ER; Take 2 tablets (1,500 mg total) by mouth daily with breakfast.  Dispense: 180 tablet; Refill: 1 -      Rybelsus ; Take 1 tablet (3 mg total) by mouth daily.  Dispense: 30 tablet; Refill: 0  Dyslipidemia, goal LDL below 100 -     Lipid panel; Future -     Atorvastatin  Calcium ; Take 1 tablet (10 mg total) by mouth daily.  Dispense:  90 tablet; Refill: 1     Follow-up: Return in about 4 months (around 04/01/2024).  Debby Molt, MD "

## 2023-12-04 ENCOUNTER — Ambulatory Visit: Payer: Self-pay | Admitting: Internal Medicine

## 2023-12-04 DIAGNOSIS — E1140 Type 2 diabetes mellitus with diabetic neuropathy, unspecified: Secondary | ICD-10-CM | POA: Insufficient documentation

## 2023-12-04 LAB — FOLATE: Folate: >23.7 ng/mL (ref >5.9)

## 2023-12-04 LAB — VITAMIN B12: Vitamin B-12: 1143 pg/mL — ABNORMAL HIGH (ref 211–911)

## 2023-12-04 MED ORDER — METFORMIN HCL ER 750 MG PO TB24: 1500.0000 mg | ORAL_TABLET | Freq: Every day | ORAL | 1 refills | Status: DC

## 2023-12-04 MED ORDER — RYBELSUS 3 MG PO TABS
3.0000 mg | ORAL_TABLET | Freq: Every day | ORAL | 0 refills | Status: AC
Start: 2023-12-04 — End: ?

## 2023-12-04 MED ORDER — ATORVASTATIN CALCIUM 10 MG PO TABS: 10.0000 mg | ORAL_TABLET | Freq: Every day | ORAL | 1 refills | Status: AC

## 2023-12-15 DIAGNOSIS — D529 Folate deficiency anemia, unspecified: Secondary | ICD-10-CM | POA: Diagnosis not present

## 2023-12-15 DIAGNOSIS — G8220 Paraplegia, unspecified: Secondary | ICD-10-CM | POA: Diagnosis not present

## 2023-12-15 DIAGNOSIS — E785 Hyperlipidemia, unspecified: Secondary | ICD-10-CM | POA: Diagnosis not present

## 2023-12-15 DIAGNOSIS — G360 Neuromyelitis optica [Devic]: Secondary | ICD-10-CM | POA: Diagnosis not present

## 2023-12-15 DIAGNOSIS — E1165 Type 2 diabetes mellitus with hyperglycemia: Secondary | ICD-10-CM | POA: Diagnosis not present

## 2023-12-15 DIAGNOSIS — E1142 Type 2 diabetes mellitus with diabetic polyneuropathy: Secondary | ICD-10-CM | POA: Diagnosis not present

## 2023-12-15 DIAGNOSIS — R32 Unspecified urinary incontinence: Secondary | ICD-10-CM | POA: Diagnosis not present

## 2023-12-15 DIAGNOSIS — N319 Neuromuscular dysfunction of bladder, unspecified: Secondary | ICD-10-CM | POA: Diagnosis not present

## 2023-12-15 DIAGNOSIS — N400 Enlarged prostate without lower urinary tract symptoms: Secondary | ICD-10-CM | POA: Diagnosis not present

## 2023-12-19 DIAGNOSIS — N202 Calculus of kidney with calculus of ureter: Secondary | ICD-10-CM | POA: Diagnosis not present

## 2024-01-02 DIAGNOSIS — M4716 Other spondylosis with myelopathy, lumbar region: Secondary | ICD-10-CM | POA: Diagnosis not present

## 2024-01-02 DIAGNOSIS — G369 Acute disseminated demyelination, unspecified: Secondary | ICD-10-CM | POA: Diagnosis not present

## 2024-01-02 DIAGNOSIS — Z993 Dependence on wheelchair: Secondary | ICD-10-CM | POA: Diagnosis not present

## 2024-01-27 ENCOUNTER — Encounter: Payer: Self-pay | Admitting: Internal Medicine

## 2024-01-27 ENCOUNTER — Ambulatory Visit: Payer: Self-pay | Admitting: Internal Medicine

## 2024-01-27 ENCOUNTER — Ambulatory Visit: Admitting: Internal Medicine

## 2024-01-27 ENCOUNTER — Ambulatory Visit (INDEPENDENT_AMBULATORY_CARE_PROVIDER_SITE_OTHER)

## 2024-01-27 VITALS — BP 104/74 | HR 83 | Temp 98.1°F | Resp 16

## 2024-01-27 DIAGNOSIS — N319 Neuromuscular dysfunction of bladder, unspecified: Secondary | ICD-10-CM

## 2024-01-27 DIAGNOSIS — E538 Deficiency of other specified B group vitamins: Secondary | ICD-10-CM

## 2024-01-27 DIAGNOSIS — M25541 Pain in joints of right hand: Secondary | ICD-10-CM

## 2024-01-27 DIAGNOSIS — E1140 Type 2 diabetes mellitus with diabetic neuropathy, unspecified: Secondary | ICD-10-CM | POA: Diagnosis not present

## 2024-01-27 DIAGNOSIS — G369 Acute disseminated demyelination, unspecified: Secondary | ICD-10-CM | POA: Diagnosis not present

## 2024-01-27 DIAGNOSIS — E119 Type 2 diabetes mellitus without complications: Secondary | ICD-10-CM

## 2024-01-27 DIAGNOSIS — Z7984 Long term (current) use of oral hypoglycemic drugs: Secondary | ICD-10-CM

## 2024-01-27 DIAGNOSIS — R200 Anesthesia of skin: Secondary | ICD-10-CM

## 2024-01-27 DIAGNOSIS — Z23 Encounter for immunization: Secondary | ICD-10-CM | POA: Diagnosis not present

## 2024-01-27 LAB — BASIC METABOLIC PANEL WITH GFR
BUN: 16 mg/dL (ref 6–23)
CO2: 28 meq/L (ref 19–32)
Calcium: 9.7 mg/dL (ref 8.4–10.5)
Chloride: 103 meq/L (ref 96–112)
Creatinine, Ser: 1.31 mg/dL (ref 0.40–1.50)
GFR: 62.6 mL/min
Glucose, Bld: 115 mg/dL — ABNORMAL HIGH (ref 70–99)
Potassium: 4.8 meq/L (ref 3.5–5.1)
Sodium: 138 meq/L (ref 135–145)

## 2024-01-27 LAB — CBC WITH DIFFERENTIAL/PLATELET
Basophils Absolute: 0 K/uL (ref 0.0–0.1)
Basophils Relative: 1 % (ref 0.0–3.0)
Eosinophils Absolute: 0.2 K/uL (ref 0.0–0.7)
Eosinophils Relative: 7.4 % — ABNORMAL HIGH (ref 0.0–5.0)
HCT: 38.5 % — ABNORMAL LOW (ref 39.0–52.0)
Hemoglobin: 12.5 g/dL — ABNORMAL LOW (ref 13.0–17.0)
Lymphocytes Relative: 24 % (ref 12.0–46.0)
Lymphs Abs: 0.6 K/uL — ABNORMAL LOW (ref 0.7–4.0)
MCHC: 32.5 g/dL (ref 30.0–36.0)
MCV: 76.3 fl — ABNORMAL LOW (ref 78.0–100.0)
Monocytes Absolute: 0.4 K/uL (ref 0.1–1.0)
Monocytes Relative: 16.3 % — ABNORMAL HIGH (ref 3.0–12.0)
Neutro Abs: 1.4 K/uL (ref 1.4–7.7)
Neutrophils Relative %: 51.3 % (ref 43.0–77.0)
Platelets: 222 K/uL (ref 150.0–400.0)
RBC: 5.05 Mil/uL (ref 4.22–5.81)
RDW: 15.8 % — ABNORMAL HIGH (ref 11.5–15.5)
WBC: 2.7 K/uL — ABNORMAL LOW (ref 4.0–10.5)

## 2024-01-27 MED ORDER — VITAMIN B-1 50 MG PO TABS: 50.0000 mg | ORAL_TABLET | Freq: Every day | ORAL | 1 refills | Status: AC

## 2024-01-27 MED ORDER — METFORMIN HCL ER 750 MG PO TB24: 1500.0000 mg | ORAL_TABLET | Freq: Every day | ORAL | 1 refills | Status: AC

## 2024-01-27 NOTE — Patient Instructions (Signed)
 Vitamin B12 Deficiency Vitamin B12 deficiency means that your body does not have enough vitamin B12. The body needs this important vitamin: To make red blood cells. To make genes (DNA). To help the nerves work. If you do not have enough vitamin B12 in your body, you can have health problems, such as not having enough red blood cells in the blood (anemia). What are the causes? Not eating enough foods that contain vitamin B12. Not being able to take in (absorb) vitamin B12 from the food that you eat. Certain diseases. A condition in which the body does not make enough of a certain protein. This results in your body not taking in enough vitamin B12. Having a surgery in which part of the stomach or small intestine is taken out. Taking medicines that make it hard for the body to take in vitamin B12. These include: Heartburn medicines. Some medicines that are used to treat diabetes. What increases the risk? Being an older adult. Eating a vegetarian or vegan diet that does not include any foods that come from animals. Not eating enough foods that contain vitamin B12 while you are pregnant. Taking certain medicines. Having alcoholism. What are the signs or symptoms? In some cases, there are no symptoms. If the condition leads to too few blood cells or nerve damage, symptoms can occur, such as: Feeling weak or tired. Not being hungry. Losing feeling (numbness) or tingling in your hands and feet. Redness and burning of the tongue. Feeling sad (depressed). Confusion or memory problems. Trouble walking. If anemia is very bad, symptoms can include: Being short of breath. Being dizzy. Having a very fast heartbeat. How is this treated? Changing the way you eat and drink, such as: Eating more foods that contain vitamin B12. Drinking little or no alcohol. Getting vitamin B12 shots. Taking vitamin B12 supplements by mouth (orally). Your doctor will tell you the dose that is best for you. Follow  these instructions at home: Eating and drinking  Eat foods that come from animals and have a lot of vitamin B12 in them. These include: Meats and poultry. This includes beef, pork, chicken, [place], and organ meats, such as liver. Seafood, such as clams, rainbow trout, salmon, tuna, and haddock. Eggs. Dairy foods such as milk, yogurt, and cheese. Eat breakfast cereals that have vitamin B12 added to them (are fortified). Check the label. The items listed above may not be a complete list of foods and beverages you can eat and drink. Contact a dietitian for more information. Alcohol use Do not drink alcohol if: Your doctor tells you not to drink. You are pregnant, may be pregnant, or are planning to become pregnant. If you drink alcohol: Limit how much you have to: 0-1 drink a day for women. 0-2 drinks a day for men. Know how much alcohol is in your drink. In the [place] one drink equals one 12 oz bottle of beer (355 mL), one 5 oz glass of wine (148 mL), or one 1 oz glass of hard liquor (44 mL). General instructions Get any vitamin B12 shots if told by your doctor. Take supplements only as told by your doctor. Follow the directions. Keep all follow-up visits. Contact a doctor if: Your symptoms come back. Your symptoms get worse or do not get better with treatment. Get help right away if: You have trouble breathing. You have a very fast heartbeat. You have chest pain. You get dizzy. You faint. These symptoms may be an emergency. Get help right away. Call 911.  Do not wait to see if the symptoms will go away. Do not drive yourself to the hospital. Summary Vitamin B12 deficiency means that your body is not getting enough of the vitamin. In some cases, there are no symptoms of this condition. Treatment may include making a change in the way you eat and drink, getting shots, or taking supplements. Eat foods that have vitamin B12 in them. This information is not intended to replace advice  given to you by your health care provider. Make sure you discuss any questions you have with your health care provider. Document Revised: 09/08/2020 Document Reviewed: 09/08/2020 Elsevier Patient Education  2024 [employer].

## 2024-01-27 NOTE — Progress Notes (Unsigned)
 "  Subjective:  Patient ID: Victor Christensen, male    DOB: 1970/10/27  Age: 53 y.o. MRN: [id number]  CC: Hand Pain (Right hand numbness waking him up in the morning keeping him up for a few hours for the last three weeks. )   HPI Victor Christensen presents for f/up ---   Discussed the use of AI scribe software for clinical note transcription with the patient, who gave verbal consent to proceed.  History of Present Illness Victor Christensen is a 53 year old male who presents with right hand numbness, tingling, and pain.  He has been experiencing numbness, tingling, and pain in his right hand for approximately three weeks. The symptoms are intense and severe enough to wake him up at night, preventing him from returning to sleep. The pain is described as 'stabbing' and 'throbbing', primarily affecting specific areas of his hand. The numbness and tingling have been present for a longer period, but not to the current extreme.  He denies any associated neck symptoms or pain.  He has been taking gabapentin  and ibuprofen  for the pain, but these medications have not provided significant relief.     Outpatient Medications Prior to Visit  Medication Sig Dispense Refill   atorvastatin  (LIPITOR) 10 MG tablet Take 1 tablet (10 mg total) by mouth daily. 90 tablet 1   Blood Glucose Monitoring Suppl (CONTOUR NEXT GEN MONITOR) w/Device KIT 1 Act by Does not apply route 2 (two) times daily. 2 kit 0   folic acid  (FOLVITE ) 1 MG tablet Take 1 tablet (1 mg total) by mouth daily. 90 tablet 1   gabapentin  (NEURONTIN ) 300 MG capsule Take 1 capsule (300 mg total) by mouth 3 (three) times daily. 270 capsule 1   glucose blood test strip 1 each by Other route 2 (two) times daily. Use as instructed 100 each 5   Semaglutide  (RYBELSUS ) 3 MG TABS Take 1 tablet (3 mg total) by mouth daily. 30 tablet 0   tamsulosin (FLOMAX) 0.4 MG CAPS capsule Take 0.4 mg by mouth daily.     metFORMIN  (GLUCOPHAGE -XR) 750 MG 24 hr tablet Take 2  tablets (1,500 mg total) by mouth daily with breakfast. 180 tablet 1   No facility-administered medications prior to visit.    ROS Review of Systems  Musculoskeletal:  Positive for arthralgias and gait problem.  Neurological:  Positive for weakness and numbness. Negative for dizziness, syncope and speech difficulty.  Hematological:  Negative for adenopathy. Does not bruise/bleed easily.  Psychiatric/Behavioral: Negative.      Objective:  BP 104/74 (BP Location: Left Arm, Patient Position: Sitting, Cuff Size: Normal) Comment: BP (R) 106/78  Pulse 83   Temp 98.1 F (36.7 C) (Oral)   Resp 16   SpO2 97%   BP Readings from Last 3 Encounters:  01/27/24 104/74  12/03/23 106/78  11/13/23 124/86    Wt Readings from Last 3 Encounters:  12/03/23 224 lb (101.6 kg)  10/06/23 225 lb (102.1 kg)  08/04/23 215 lb (97.5 kg)    Physical Exam  Lab Results  Component Value Date   WBC 2.7 (L) 01/27/2024   HGB 12.5 (L) 01/27/2024   HCT 38.5 (L) 01/27/2024   PLT 222.0 01/27/2024   GLUCOSE 115 (H) 01/27/2024   CHOL 97 12/03/2023   TRIG 111.0 12/03/2023   HDL 33.60 (L) 12/03/2023   LDLCALC 42 12/03/2023   ALT 19 02/20/2023   AST 18 02/20/2023   NA 138 01/27/2024   K 4.8 01/27/2024   CL 103  01/27/2024   CREATININE 1.31 01/27/2024   BUN 16 01/27/2024   CO2 28 01/27/2024   TSH 2.18 08/04/2023   PSA 0.41 08/04/2023   HGBA1C 7.8 (H) 12/03/2023   MICROALBUR 1.0 08/04/2023    DG Hand Complete Right Result Date: 01/27/2024 EXAM: 3 OR MORE VIEW(S) XRAY OF THE HAND 01/27/2024 10:43:46 AM COMPARISON: None available. CLINICAL HISTORY: pain FINDINGS: BONES AND JOINTS: No acute fracture. No malalignment. SOFT TISSUES: The soft tissues are unremarkable. IMPRESSION: 1. No acute findings. Electronically signed by: Morgane Naveau MD 01/27/2024 12:25 PM EST RP Workstation: [id number]   DG Cervical Spine Complete Result Date: 01/27/2024 EXAM: 6 VIEW(S) XRAY OF THE CERVICAL SPINE 01/27/2024  10:43:46 AM COMPARISON: None available. CLINICAL HISTORY: pain and numbness FINDINGS: LIMITATIONS: Limited evaluation due to overlapping osseous structures and overlying soft tissues. BONES: Vertebral body heights are maintained. Alignment is normal. No severe osseous neural foraminal stenosis. DISCS AND DEGENERATIVE CHANGES: Multilevel mild facet arthropathy. SOFT TISSUES: No prevertebral soft tissue swelling. The visualized lungs appear clear. IMPRESSION: 1. No acute findings. 2. No severe osseous neural foraminal stenosis. Limited evaluation due to overlapping osseous structures and overlying soft tissues. Electronically signed by: Morgane Naveau MD 01/27/2024 12:24 PM EST RP Workstation: [id number]     Assessment & Plan:  Right upper extremity numbness -     DG Cervical Spine Complete; Future  Arthralgia of right hand -     DG Hand Complete Right; Future  Type 2 diabetes mellitus without complication, without long-term current use of insulin  (HCC) -     Basic metabolic panel with GFR; Future  Vitamin B12 deficiency -     CBC with Differential/Platelet; Future  Need for immunization against influenza -     Flu vaccine trivalent PF, 6mos and older(Flulaval,Afluria,Fluarix,Fluzone)  Type 2 diabetes mellitus with diabetic neuropathy, without long-term current use of insulin  (HCC) -     metFORMIN  HCl ER; Take 2 tablets (1,500 mg total) by mouth daily with breakfast.  Dispense: 180 tablet; Refill: 1  Neuromyelitis (HCC) -     Vitamin B-1; Take 1 tablet (50 mg total) by mouth daily.  Dispense: 90 tablet; Refill: 1  Neurogenic bladder -     Vitamin B-1; Take 1 tablet (50 mg total) by mouth daily.  Dispense: 90 tablet; Refill: 1     Follow-up: Return in about 3 months (around 04/26/2024).  Victor Molt, MD "

## 2024-03-10 ENCOUNTER — Ambulatory Visit: Admitting: Dermatology

## 2024-03-16 ENCOUNTER — Ambulatory Visit: Admitting: Dermatology

## 2024-08-02 ENCOUNTER — Ambulatory Visit

## 2024-11-15 ENCOUNTER — Inpatient Hospital Stay: Admitting: Hematology and Oncology

## 2024-11-15 ENCOUNTER — Inpatient Hospital Stay
